# Patient Record
Sex: Female | Born: 1945 | Race: White | Hispanic: No | Marital: Married | Smoking: Former smoker
Health system: Southern US, Community
[De-identification: ages and names within clinical notes are randomized; demographics above are authoritative.]

## PROBLEM LIST (undated history)

## (undated) DIAGNOSIS — T7840XA Allergy, unspecified, initial encounter: Secondary | ICD-10-CM

## (undated) DIAGNOSIS — Z8489 Family history of other specified conditions: Secondary | ICD-10-CM

## (undated) HISTORY — PX: BREAST BIOPSY: SHX20

## (undated) HISTORY — PX: BILATERAL OOPHORECTOMY: SHX1221

## (undated) HISTORY — PX: ABDOMINAL HYSTERECTOMY: SHX81

## (undated) HISTORY — DX: Allergy, unspecified, initial encounter: T78.40XA

---

## 1992-12-20 HISTORY — PX: CHOLECYSTECTOMY: SHX55

## 1992-12-20 HISTORY — PX: ABDOMINAL HYSTERECTOMY: SHX81

## 1999-04-01 ENCOUNTER — Other Ambulatory Visit: Admission: RE | Admit: 1999-04-01 | Discharge: 1999-04-01 | Payer: Self-pay | Admitting: Obstetrics and Gynecology

## 1999-07-28 ENCOUNTER — Ambulatory Visit (HOSPITAL_COMMUNITY): Admission: RE | Admit: 1999-07-28 | Discharge: 1999-07-28 | Payer: Self-pay | Admitting: Gastroenterology

## 2000-09-09 ENCOUNTER — Encounter: Admission: RE | Admit: 2000-09-09 | Discharge: 2000-09-09 | Payer: Self-pay | Admitting: Obstetrics and Gynecology

## 2000-09-09 ENCOUNTER — Encounter: Payer: Self-pay | Admitting: Obstetrics and Gynecology

## 2000-10-06 ENCOUNTER — Other Ambulatory Visit: Admission: RE | Admit: 2000-10-06 | Discharge: 2000-10-06 | Payer: Self-pay | Admitting: Obstetrics and Gynecology

## 2001-11-01 ENCOUNTER — Encounter: Admission: RE | Admit: 2001-11-01 | Discharge: 2001-11-01 | Payer: Self-pay | Admitting: Obstetrics and Gynecology

## 2001-11-01 ENCOUNTER — Encounter: Payer: Self-pay | Admitting: Obstetrics and Gynecology

## 2001-12-26 ENCOUNTER — Other Ambulatory Visit: Admission: RE | Admit: 2001-12-26 | Discharge: 2001-12-26 | Payer: Self-pay | Admitting: Obstetrics and Gynecology

## 2003-12-06 ENCOUNTER — Encounter: Admission: RE | Admit: 2003-12-06 | Discharge: 2003-12-06 | Payer: Self-pay | Admitting: Obstetrics and Gynecology

## 2004-01-17 ENCOUNTER — Other Ambulatory Visit: Admission: RE | Admit: 2004-01-17 | Discharge: 2004-01-17 | Payer: Self-pay | Admitting: Obstetrics and Gynecology

## 2006-03-14 ENCOUNTER — Encounter: Admission: RE | Admit: 2006-03-14 | Discharge: 2006-03-14 | Payer: Self-pay | Admitting: Obstetrics and Gynecology

## 2006-05-09 ENCOUNTER — Emergency Department: Payer: Self-pay | Admitting: Emergency Medicine

## 2008-05-21 ENCOUNTER — Encounter: Admission: RE | Admit: 2008-05-21 | Discharge: 2008-05-21 | Payer: Self-pay | Admitting: Obstetrics and Gynecology

## 2012-03-22 LAB — HM MAMMOGRAPHY: HM Mammogram: NORMAL

## 2012-04-14 ENCOUNTER — Ambulatory Visit (INDEPENDENT_AMBULATORY_CARE_PROVIDER_SITE_OTHER): Payer: Medicare Other | Admitting: Internal Medicine

## 2012-04-14 ENCOUNTER — Encounter: Payer: Self-pay | Admitting: Internal Medicine

## 2012-04-14 VITALS — BP 128/70 | HR 85 | Temp 98.2°F | Resp 16 | Ht 63.5 in | Wt 231.8 lb

## 2012-04-14 DIAGNOSIS — Z1239 Encounter for other screening for malignant neoplasm of breast: Secondary | ICD-10-CM

## 2012-04-14 DIAGNOSIS — T7840XA Allergy, unspecified, initial encounter: Secondary | ICD-10-CM | POA: Insufficient documentation

## 2012-04-14 DIAGNOSIS — N390 Urinary tract infection, site not specified: Secondary | ICD-10-CM | POA: Insufficient documentation

## 2012-04-14 DIAGNOSIS — E538 Deficiency of other specified B group vitamins: Secondary | ICD-10-CM

## 2012-04-14 DIAGNOSIS — R5383 Other fatigue: Secondary | ICD-10-CM

## 2012-04-14 DIAGNOSIS — E785 Hyperlipidemia, unspecified: Secondary | ICD-10-CM

## 2012-04-14 DIAGNOSIS — M1990 Unspecified osteoarthritis, unspecified site: Secondary | ICD-10-CM

## 2012-04-14 DIAGNOSIS — E559 Vitamin D deficiency, unspecified: Secondary | ICD-10-CM

## 2012-04-14 DIAGNOSIS — E669 Obesity, unspecified: Secondary | ICD-10-CM

## 2012-04-14 DIAGNOSIS — R5381 Other malaise: Secondary | ICD-10-CM

## 2012-04-14 DIAGNOSIS — Z13820 Encounter for screening for osteoporosis: Secondary | ICD-10-CM

## 2012-04-14 DIAGNOSIS — M129 Arthropathy, unspecified: Secondary | ICD-10-CM

## 2012-04-14 LAB — POCT URINALYSIS DIPSTICK
Ketones, UA: NEGATIVE
Protein, UA: NEGATIVE
Spec Grav, UA: 1.02
pH, UA: 5

## 2012-04-14 MED ORDER — DICYCLOMINE HCL 20 MG PO TABS: 20.0000 mg | ORAL_TABLET | Freq: Four times a day (QID) | ORAL | Status: DC

## 2012-04-14 MED ORDER — SULFAMETHOXAZOLE-TMP DS 800-160 MG PO TABS: 1.0000 | ORAL_TABLET | Freq: Two times a day (BID) | ORAL | Status: AC

## 2012-04-14 MED ORDER — ESTRADIOL 0.1 MG/GM VA CREA: 2.0000 g | TOPICAL_CREAM | Freq: Every day | VAGINAL | Status: AC
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
## 2012-04-14 NOTE — Patient Instructions (Addendum)
Return for  Fasting labs prior to your physical.  If your urine is reflective of infection., we will call you in an antibiotic.    Estrace 2 grams nightly x 2 weeks,  Then reduce to 3 times weekly
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
## 2012-04-16 LAB — URINE CULTURE

## 2012-04-16 NOTE — Assessment & Plan Note (Signed)
I have addressed  BMI and recommended a low glycemic index diet utilizing smaller more frequent meals to increase metabolism.  I have also recommended that patient start exercising with a goal of 30 minutes of aerobic exercise a minimum of 5 days per week. Screening for lipid disorders, thyroid and diabetes to be done today.   

## 2012-04-16 NOTE — Assessment & Plan Note (Signed)
Last DEXA and mammogram were done at [facility] and she is due for both.

## 2012-04-18 ENCOUNTER — Telehealth: Payer: Self-pay | Admitting: Internal Medicine

## 2012-04-18 NOTE — Telephone Encounter (Signed)
Radiologist is requesting additional views of left breast,.  Has she been contacted?

## 2012-04-18 NOTE — Telephone Encounter (Signed)
Patient has an appt tomorrow for additional view

## 2012-04-26 ENCOUNTER — Encounter: Payer: Self-pay | Admitting: Internal Medicine

## 2012-05-02 ENCOUNTER — Encounter: Payer: Self-pay | Admitting: Internal Medicine

## 2012-05-08 ENCOUNTER — Other Ambulatory Visit (INDEPENDENT_AMBULATORY_CARE_PROVIDER_SITE_OTHER): Payer: Medicare Other | Admitting: *Deleted

## 2012-05-08 DIAGNOSIS — E538 Deficiency of other specified B group vitamins: Secondary | ICD-10-CM

## 2012-05-08 DIAGNOSIS — R5383 Other fatigue: Secondary | ICD-10-CM

## 2012-05-08 DIAGNOSIS — E559 Vitamin D deficiency, unspecified: Secondary | ICD-10-CM

## 2012-05-08 DIAGNOSIS — R5381 Other malaise: Secondary | ICD-10-CM

## 2012-05-08 DIAGNOSIS — E785 Hyperlipidemia, unspecified: Secondary | ICD-10-CM

## 2012-05-08 LAB — CBC WITH DIFFERENTIAL/PLATELET
Eosinophils Relative: 2.7 % (ref 0.0–5.0)
HCT: 37.3 % (ref 36.0–46.0)
Hemoglobin: 12.2 g/dL (ref 12.0–15.0)
Lymphs Abs: 1.6 10*3/uL (ref 0.7–4.0)
MCV: 84.7 fl (ref 78.0–100.0)
Monocytes Absolute: 0.4 10*3/uL (ref 0.1–1.0)
Neutro Abs: 3.1 10*3/uL (ref 1.4–7.7)
Platelets: 178 10*3/uL (ref 150.0–400.0)
WBC: 5.3 10*3/uL (ref 4.5–10.5)

## 2012-05-08 LAB — LIPID PANEL
LDL Cholesterol: 70 mg/dL (ref 0–99)
Total CHOL/HDL Ratio: 2

## 2012-05-09 LAB — COMPLETE METABOLIC PANEL WITH GFR
ALT: 15 U/L (ref 0–35)
AST: 22 U/L (ref 0–37)
Albumin: 3.9 g/dL (ref 3.5–5.2)
Alkaline Phosphatase: 52 U/L (ref 39–117)
Calcium: 8.9 mg/dL (ref 8.4–10.5)
Chloride: 107 mEq/L (ref 96–112)
Potassium: 4.4 mEq/L (ref 3.5–5.3)

## 2012-05-22 ENCOUNTER — Encounter: Payer: Self-pay | Admitting: Internal Medicine

## 2012-05-22 ENCOUNTER — Ambulatory Visit (INDEPENDENT_AMBULATORY_CARE_PROVIDER_SITE_OTHER): Payer: Medicare Other | Admitting: Internal Medicine

## 2012-05-22 VITALS — BP 120/68 | HR 69 | Temp 98.0°F | Resp 16 | Ht 64.0 in | Wt 228.0 lb

## 2012-05-22 DIAGNOSIS — Z0000 Encounter for general adult medical examination without abnormal findings: Secondary | ICD-10-CM

## 2012-05-22 DIAGNOSIS — R739 Hyperglycemia, unspecified: Secondary | ICD-10-CM

## 2012-05-22 DIAGNOSIS — R7309 Other abnormal glucose: Secondary | ICD-10-CM

## 2012-05-22 MED ORDER — ZOSTER VACCINE LIVE 19400 UNT/0.65ML ~~LOC~~ SOLR: 0.6500 mL | Freq: Once | SUBCUTANEOUS | Status: AC

## 2012-05-22 MED ORDER — PNEUMOCOCCAL VAC POLYVALENT 25 MCG/0.5ML IJ INJ: 0.5000 mL | INJECTION | Freq: Once | INTRAMUSCULAR | Status: AC

## 2012-05-22 NOTE — Assessment & Plan Note (Signed)
Discussed diet and exercise for goal BMI of < 30. Marland Kitchen  Dispelled any notion that low glycemic index diet increased risk for osteoporosis

## 2012-05-22 NOTE — Assessment & Plan Note (Signed)
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  She is s/p TAH/BSO.  Breast exam was also done and was normal.

## 2012-05-22 NOTE — Progress Notes (Signed)
Patient ID: Krista Gentry, female   DOB: 06-03-1946, 66 y.o.   MRN: [id number] The patient is here for annual Medicare wellness examination and management of other chronic and acute problems.   The risk factors are reflected in the social history.  The roster of all physicians providing medical care to patient - is listed in the Snapshot section of the chart.  Activities of daily living:  The patient is 100% independent in all ADLs: dressing, toileting, feeding as well as independent mobility  Home safety : The patient has smoke detectors in the home. They wear seatbelts.  There are no firearms at home. There is no violence in the home.   There is no risks for hepatitis, STDs or HIV. There is no   history of blood transfusion. They have no travel history to infectious disease endemic areas of the world.  The patient has seen their dentist in the last six month. They have seen their eye doctor in the last year. They admit to slight hearing difficulty with regard to whispered voices and some television programs.  They have deferred audiologic testing in the last year.  They do not  have excessive sun exposure. Discussed the need for sun protection: hats, long sleeves and use of sunscreen if there is significant sun exposure.   Diet: the importance of a healthy diet is discussed. They do have a healthy diet.  The benefits of regular aerobic exercise were discussed. She walks 4 times per week ,  20 minutes.   Depression screen: there are no signs or vegative symptoms of depression- irritability, change in appetite, anhedonia, sadness/tearfullness.  Cognitive assessment: the patient manages all their financial and personal affairs and is actively engaged. They could relate day,date,year and events; recalled 2/3 objects at 3 minutes; performed clock-face test normally.  The following portions of the patient's history were reviewed and updated as appropriate: allergies, current medications, past  family history, past medical history,  past surgical history, past social history  and problem list.  Visual acuity was not assessed per patient preference since she has regular follow up with her ophthalmologist. Hearing and body mass index were assessed and reviewed.   During the course of the visit the patient was educated and counseled about appropriate screening and preventive services including : fall prevention , diabetes screening, nutrition counseling, colorectal cancer screening, and recommended immunizations.

## 2012-05-22 NOTE — Patient Instructions (Addendum)
Consider resuming a  Low Glycemic Index Diet.  You can google "Low GI" to get lists of low GI fruits and vegetables  Also consider eating 6 smaller meals daily .  This boosts your metabolism and regulates your sugars.  Here is an example   7 AM Low carbohydrate Protein  Shakes (EAS Carb Control  Or Atkins ,  Available everywhere, In  cases at [employer] )  2.5 carbs  (Add or substitute a toasted sandwhich thin w/ peanut butter)  10 AM: Protein bar by Atkins (snack size,  Chocolate lover's variety at  [employer])    Lunch: sandwich on pita bread or flatbread (Joseph's makes a pita bread and a flat bread , available at [employer] and [employer]; Toufayah makes a low carb flatbread available at [employer] and [employer]) Mission makes a low carb whole wheat tortilla available at [employer] most grocery stores   3 PM:  Mid day :  Another protein bar,  Or a  cheese stick, 1/4 cup of almonds, walnuts, pistachios, pecans, peanuts,  Macadamia nuts  6 PM  Dinner:  "mean and green:"  Meat/chicken/fish, salad, and green veggie : use ranch, vinagrette,  Blue cheese, etc  9 PM snack : Breyer's low carb fudgsicle or  ice cream bar (Carb Smart), or  Weight Watcher's ice cream bar , or another protein shake  -------------------------------------------------------------------------------------------------------------------------------------------------------------  Also consider [place] yogurt as a mid day snack

## 2013-02-03 ENCOUNTER — Other Ambulatory Visit: Payer: Self-pay

## 2013-07-25 ENCOUNTER — Other Ambulatory Visit: Payer: Self-pay

## 2013-08-02 ENCOUNTER — Telehealth: Payer: Self-pay | Admitting: Internal Medicine

## 2013-08-02 ENCOUNTER — Emergency Department: Payer: Self-pay | Admitting: Emergency Medicine

## 2013-08-02 LAB — CBC
HCT: 38 % (ref 35.0–47.0)
HGB: 12.7 g/dL (ref 12.0–16.0)
MCH: 27.7 pg (ref 26.0–34.0)
MCHC: 33.5 g/dL (ref 32.0–36.0)
Platelet: 186 10*3/uL (ref 150–440)
RDW: 15.1 % — ABNORMAL HIGH (ref 11.5–14.5)

## 2013-08-02 LAB — COMPREHENSIVE METABOLIC PANEL
Albumin: 3.6 g/dL (ref 3.4–5.0)
Alkaline Phosphatase: 74 U/L (ref 50–136)
Anion Gap: 5 — ABNORMAL LOW (ref 7–16)
BUN: 17 mg/dL (ref 7–18)
Bilirubin,Total: 0.6 mg/dL (ref 0.2–1.0)
Calcium, Total: 9 mg/dL (ref 8.5–10.1)
Chloride: 106 mmol/L (ref 98–107)
Co2: 28 mmol/L (ref 21–32)
Creatinine: 0.87 mg/dL (ref 0.60–1.30)
EGFR (African American): >60
EGFR (Non-African Amer.): >60
Osmolality: 279 (ref 275–301)
SGOT(AST): 29 U/L (ref 15–37)
SGPT (ALT): 32 U/L (ref 12–78)
Sodium: 139 mmol/L (ref 136–145)
Total Protein: 7.2 g/dL (ref 6.4–8.2)

## 2013-08-02 LAB — BASIC METABOLIC PANEL
BUN: 17 mg/dL (ref 4–21)
Creatinine: 0.9 mg/dL (ref 0.5–1.1)
Glucose: 98 mg/dL
Potassium: 3.6 mmol/L (ref 3.4–5.3)

## 2013-08-02 LAB — CBC AND DIFFERENTIAL: WBC: 12.9 10^3/mL

## 2013-08-02 LAB — URINALYSIS, COMPLETE
Bacteria: NONE SEEN
Bilirubin,UR: NEGATIVE
Ph: 5 (ref 4.5–8.0)
Specific Gravity: 1.018 (ref 1.003–1.030)
Squamous Epithelial: 1

## 2013-08-02 LAB — TROPONIN I: Troponin-I: <0.02 ng/mL

## 2013-08-02 LAB — PRO B NATRIURETIC PEPTIDE: B-Type Natriuretic Peptide: 136 pg/mL — ABNORMAL HIGH (ref 0–125)

## 2013-08-02 NOTE — Telephone Encounter (Signed)
Patient Information:  Caller Name: Dolores  Phone: [phone]  Patient: Krista Gentry, Krista Gentry  Gender: Female  DOB: [birth year]  Age: 67 Years  PCP: Duncan Dull (Adults only)  Office Follow Up:  Does the office need to follow up with this patient?: No  Instructions For The Office: N/A  RN Note:  Had diarrhea yesterday related to IBS. Had only banana bread and water this morning at 0800. Last void at 1520. Can stand up but avoiding doing so due to ongoing weakness.   Symptoms  Reason For Call & Symptoms: Syncope twice 08/02/13 between 0800 and 1100.  Became dizzy, nauseated then passed out.  Woke upon floor with vomit around her.  Hit chin; minor abrasion to chest.  Fainted again after 2nd emesis approximately 1100 with abrasion to lip.  BP 138/70 P 70 approximately 0800.  Reviewed Health History In EMR: Yes  Reviewed Medications In EMR: Yes  Reviewed Allergies In EMR: Yes  Reviewed Surgeries / Procedures: Yes  Date of Onset of Symptoms: 08/02/2013  Guideline(s) Used:  Fainting  Disposition Per Guideline:   Go to ED Now  Reason For Disposition Reached:   Fainted > 15 minutes ago and still feels weak or dizzy  Advice Given:  N/A  Patient Will Follow Care Advice:  YES

## 2013-08-02 NOTE — Telephone Encounter (Signed)
FYI

## 2013-08-15 ENCOUNTER — Ambulatory Visit (INDEPENDENT_AMBULATORY_CARE_PROVIDER_SITE_OTHER): Payer: Self-pay | Admitting: Internal Medicine

## 2013-08-15 ENCOUNTER — Ambulatory Visit: Payer: Medicare Other | Admitting: Internal Medicine

## 2013-08-15 ENCOUNTER — Encounter: Payer: Self-pay | Admitting: Internal Medicine

## 2013-08-15 VITALS — BP 138/66 | HR 83 | Temp 98.8°F | Resp 14 | Wt 232.5 lb

## 2013-08-15 DIAGNOSIS — Z09 Encounter for follow-up examination after completed treatment for conditions other than malignant neoplasm: Secondary | ICD-10-CM

## 2013-08-15 DIAGNOSIS — R55 Syncope and collapse: Secondary | ICD-10-CM

## 2013-08-15 DIAGNOSIS — Z111 Encounter for screening for respiratory tuberculosis: Secondary | ICD-10-CM

## 2013-08-15 DIAGNOSIS — Z1239 Encounter for other screening for malignant neoplasm of breast: Secondary | ICD-10-CM

## 2013-08-15 DIAGNOSIS — R011 Cardiac murmur, unspecified: Secondary | ICD-10-CM

## 2013-08-15 DIAGNOSIS — E66813 Obesity, class 3: Secondary | ICD-10-CM

## 2013-08-15 MED ORDER — TETANUS-DIPHTH-ACELL PERTUSSIS 5-2.5-18.5 LF-MCG/0.5 IM SUSP: 0.5000 mL | Freq: Once | INTRAMUSCULAR | Status: DC

## 2013-08-15 MED ORDER — ZOSTER VACCINE LIVE 19400 UNT/0.65ML ~~LOC~~ SOLR: 0.6500 mL | Freq: Once | SUBCUTANEOUS | Status: DC

## 2013-08-15 MED ORDER — ONDANSETRON HCL 4 MG PO TABS: 4.0000 mg | ORAL_TABLET | Freq: Three times a day (TID) | ORAL | Status: DC | PRN

## 2013-08-15 NOTE — Progress Notes (Signed)
Patient ID: Krista Gentry, female   DOB: December 04, 1946, 67 y.o.   MRN: [id number]  Patient Active Problem List   Diagnosis Date Noted  . Syncope and collapse 08/17/2013  . Routine general medical examination at a health care facility 05/22/2012  . Obesity, Class III, BMI 40-49.9 (morbid obesity) 04/16/2012  . Arthritis 04/14/2012  . Screening for osteoporosis 04/14/2012  . Allergy     Subjective:  CC:   Chief Complaint  Patient presents with  . Follow-up    From ER blacked out X 2    HPI:   Krista Gentry is a 67 y.o. female who presents after being lost to follow up for over a year .  She had a recent ER visit for syncope preceded by dizziness and nausea/vomiting. Patient was seen in the ER on August 14 after developing non-vertiginous dizziness accompanied by nausea and vomiting x  2 .  Her syncopal episodes occurred after vomiting. She apparently struck the bridge of her nose on either the bathtub or the bathroom floor. She was taken to the emergency room at [facility] and given fluids. EKG head CT and screening labs were done. CT of the head showed only a cephalic hematoma and age related changes. EKG was normal,  troponin was normal , BMET normal.,  white count was slightly elevated 12.9. urinalysis was normal. She denies any persistent headache or double vision but notes that her shoulders and posterior neck are tender to palpation.  She has had prior syncopal events that have occurred intermittently since childhood and remembers 2 other distinct occasions during the last 10 years. . She has never had a cardiology evaluation. She did not play any contact sports in high school and college. There is no history of sudden death in the family.   Past Medical History  Diagnosis Date  . Allergy     Past Surgical History  Procedure Laterality Date  . Bilateral oophorectomy    . Cholecystectomy  1994  . Abdominal hysterectomy         The following portions of the patient's history  were reviewed and updated as appropriate: Allergies, current medications, and problem list.    Review of Systems:   12 Pt  review of systems was negative except those addressed in the HPI,     History   Social History  . Marital Status: Married    Spouse Name: N/A    Number of Children: N/A  . Years of Education: N/A   Occupational History  . Not on file.   Social History Main Topics  . Smoking status: Former Smoker -- 5 years    Types: Cigarettes    Quit date: 04/14/1970  . Smokeless tobacco: Never Used  . Alcohol Use: 3.6 oz/week    3 Shots of liquor, 1 Cans of beer, 2 Glasses of wine per week  . Drug Use: No  . Sexual Activity: Not on file   Other Topics Concern  . Not on file   Social History Narrative  . No narrative on file    Objective:  Filed Vitals:   08/15/13 1559  BP: 138/66  Pulse: 83  Temp: 98.8 F (37.1 C)  Resp: 14     General appearance: alert, cooperative and appears stated age Ears: normal TM's and external ear canals both ears Throat: lips, mucosa, and tongue normal; teeth and gums normal Neck: no adenopathy, no carotid bruit, supple, symmetrical, trachea midline and thyroid not enlarged, symmetric, no tenderness/mass/nodules Back: symmetric,  no curvature. ROM normal. No CVA tenderness. Lungs: clear to auscultation bilaterally Heart: regular rate and rhythm, S1, S2 normal, II/VI systolic murmur, non radiating., no click, rub or gallop Abdomen: soft, non-tender; bowel sounds normal; no masses,  no organomegaly Pulses: 2+ and symmetric Skin: Skin color, texture, turgor normal. No rashes or lesions Lymph nodes: Cervical, supraclavicular, and axillary nodes normal.  Assessment and Plan:  Obesity, Class III, BMI 40-49.9 (morbid obesity) I have addressed  BMI and recommended a low glycemic index diet utilizing smaller more frequent meals to increase metabolism.  I have also recommended that patient start exercising with a goal of 30  minutes of aerobic exercise a minimum of 5 days per week.  Syncope and collapse Recurrent, with no prior workup. Review of labs done at time of ER evaluation did not suggest dehydration. She has a slight systolic murmur on exam. Given her history of recurrent syncope throughout her entire life it would be appropriate to send her to cardiology for an evaluation with an echocardiogram and possible stress test.. referral to cardiology discussed and accepted.   A total of 40 minutes was spent with patient more than half of which was spent in counseling, reviewing records from other prviders and coordination of care.  Updated Medication List Outpatient Encounter Prescriptions as of 08/15/2013  Medication Sig Dispense Refill  . b complex vitamins tablet Take 1 tablet by mouth daily.      . Calcium Carbonate-Vitamin D (CALCIUM + D) 600-200 MG-UNIT TABS Take by mouth.      . cetirizine (ZYRTEC) 10 MG tablet Take 10 mg by mouth daily.      . Cranberry 1000 MG CAPS Take by mouth.      . folic acid (FOLVITE) 1 MG tablet Take 1 mg by mouth daily.      Chilton Si Tea, Camillia sinensis, (GREEN TEA PO) Take by mouth.      Marland Kitchen ibuprofen (ADVIL,MOTRIN) 200 MG tablet Take 200 mg by mouth every 6 (six) hours as needed.      . Multiple Vitamin (MULTIVITAMIN) tablet Take 1 tablet by mouth daily.      Marland Kitchen aspirin 81 MG tablet Take 81 mg by mouth daily.      . chlorpheniramine (ALLERGY) 4 MG tablet Take 4 mg by mouth daily.      Marland Kitchen dicyclomine (BENTYL) 20 MG tablet Take 1 tablet (20 mg total) by mouth every 6 (six) hours.  90 tablet  1  . meclizine (ANTIVERT) 25 MG tablet Take 1 tablet by mouth once.      . ondansetron (ZOFRAN) 4 MG tablet Take 1 tablet (4 mg total) by mouth every 8 (eight) hours as needed for nausea.  30 tablet  0  . TDaP (BOOSTRIX) 5-2.5-18.5 LF-MCG/0.5 injection Inject 0.5 mLs into the muscle once.  0.5 mL  0  . zoster vaccine live, PF, (ZOSTAVAX) 82956 UNT/0.65ML injection Inject 19,400 Units into the  skin once.  1 each  0   No facility-administered encounter medications on file as of 08/15/2013.     Orders Placed This Encounter  Procedures  . Ambulatory referral to Cardiology    No Follow-up on file.

## 2013-08-15 NOTE — Patient Instructions (Addendum)
You received the pneumonia vaccine today.  Repeat in 5 years.  Return on Friday to have your PPD read  Referral to [facility] cardiology to have your heart evaluated

## 2013-08-15 NOTE — Assessment & Plan Note (Signed)
I have addressed  BMI and recommended a low glycemic index diet utilizing smaller more frequent meals to increase metabolism.  I have also recommended that patient start exercising with a goal of 30 minutes of aerobic exercise a minimum of 5 days per week.  

## 2013-08-17 ENCOUNTER — Encounter: Payer: Self-pay | Admitting: Internal Medicine

## 2013-08-17 DIAGNOSIS — R55 Syncope and collapse: Secondary | ICD-10-CM | POA: Insufficient documentation

## 2013-08-17 NOTE — Assessment & Plan Note (Signed)
Recurrent, with no prior workup. Review of labs done at time of ER evaluation did not suggest dehydration. She has a slight systolic murmur on exam. Given her history of recurrent syncope throughout her entire life it would be appropriate to send her to cardiology for an evaluation with an echocardiogram and possible stress test.. referral to cardiology discussed and accepted.

## 2013-08-21 LAB — TB SKIN TEST: TB Skin Test: NEGATIVE

## 2013-08-21 NOTE — Addendum Note (Signed)
Addended by: Henrene Pastor R on: 08/21/2013 08:30 AM   Modules accepted: Orders

## 2013-09-03 ENCOUNTER — Encounter: Payer: Self-pay | Admitting: Cardiovascular Disease

## 2013-09-03 ENCOUNTER — Ambulatory Visit (INDEPENDENT_AMBULATORY_CARE_PROVIDER_SITE_OTHER): Payer: Medicare Other | Admitting: Cardiovascular Disease

## 2013-09-03 VITALS — BP 126/78 | HR 88 | Ht 65.0 in | Wt 229.5 lb

## 2013-09-03 DIAGNOSIS — R55 Syncope and collapse: Secondary | ICD-10-CM
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
## 2013-09-03 NOTE — Progress Notes (Signed)
HPI  This is a pleasant 67 year old female was referred by Dr. Darrick Huntsman for evaluation of syncope. She has no previous cardiac history. She has been healthy throughout her life with no chronic medical conditions. She had an emergency room visit on August 14 after a syncopal episode. She woke up that morning feeling nauseous. She tried to walk to the bathroom and started feeling dizzy and lightheaded. She had a syncopal episode of unknown duration. This was not witnessed. She stood up and tried to walk to the bathroom again and had another syncopal episode which was preceded by vomiting.  She apparently struck the bridge of her nose on either the bathtub or the bathroom floor. She was taken to the emergency room at [facility] and given fluids. EKG head CT and screening labs were done. CT of the head showed no evidence of intracranial bleed or CVA. There was cephalohematoma over the forehead without skull fracture.  EKG was normal, troponin was normal , BMET normal., white count was slightly elevated 12.9. urinalysis was normal.  She reports a total of 2 or 3 syncopal episodes in the last 10 years. All of these episodes were preceded by nausea or vomiting. She had a syncopal episode when she was a teenager in the setting of quick standing up with known history of orthostatic hypotension during that time. During all of these episodes, she does not have any palpitations, chest pain or shortness of breath. She feels back to her normal self.  No Known Allergies   Current Outpatient Prescriptions on File Prior to Visit  Medication Sig Dispense Refill  . aspirin 81 MG tablet Take 81 mg by mouth daily.      Marland Kitchen b complex vitamins tablet Take 1 tablet by mouth daily.      . Calcium Carbonate-Vitamin D (CALCIUM + D) 600-200 MG-UNIT TABS Take by mouth daily.       . cetirizine (ZYRTEC) 10 MG tablet Take 10 mg by mouth daily.      . chlorpheniramine (ALLERGY) 4 MG tablet Take 4 mg by mouth daily.      . Cranberry  1000 MG CAPS Take by mouth.      . folic acid (FOLVITE) 1 MG tablet Take 1 mg by mouth daily.      Chilton Si Tea, Camillia sinensis, (GREEN TEA PO) Take by mouth.      Marland Kitchen ibuprofen (ADVIL,MOTRIN) 200 MG tablet Take 200 mg by mouth every 6 (six) hours as needed.      . meclizine (ANTIVERT) 25 MG tablet Take 1 tablet by mouth once.      . Multiple Vitamin (MULTIVITAMIN) tablet Take 1 tablet by mouth daily.      . TDaP (BOOSTRIX) 5-2.5-18.5 LF-MCG/0.5 injection Inject 0.5 mLs into the muscle once.  0.5 mL  0  . zoster vaccine live, PF, (ZOSTAVAX) 16109 UNT/0.65ML injection Inject 19,400 Units into the skin once.  1 each  0   No current facility-administered medications on file prior to visit.     Past Medical History  Diagnosis Date  . Allergy      Past Surgical History  Procedure Laterality Date  . Bilateral oophorectomy    . Cholecystectomy  1994  . Abdominal hysterectomy       Family History  Problem Relation Age of Onset  . Kidney disease Mother     nephrolithiasis  . Dementia Mother   . Hypertension Mother   . Mental retardation Sister 8    possible  autism,  now with depression  . Cancer Maternal Aunt     breast cancer  . Dementia Maternal Aunt   . Mental retardation Maternal Aunt   . Dementia Maternal Uncle   . Cancer Maternal Grandmother     breast cancer  . Heart attack Paternal Grandmother      History   Social History  . Marital Status: Married    Spouse Name: N/A    Number of Children: N/A  . Years of Education: N/A   Occupational History  . Not on file.   Social History Main Topics  . Smoking status: Former Smoker -- 1.00 packs/day for 5 years    Types: Cigarettes    Quit date: 04/14/1970  . Smokeless tobacco: Never Used  . Alcohol Use: 3.6 oz/week    2 Glasses of wine, 1 Cans of beer, 3 Shots of liquor per week     Comment: occassion  . Drug Use: No  . Sexual Activity: Not on file   Other Topics Concern  . Not on file   Social History  Narrative  . No narrative on file     ROS A 10  point review of system was performed. It's negative other than what is mentioned in history of present illness.  PHYSICAL EXAM   BP 126/78  Pulse 88  Ht 5\' 5"  (1.651 m)  Wt 229 lb 8 oz (104.101 kg)  BMI 38.19 kg/m2 Constitutional: She is oriented to person, place, and time. She appears well-developed and well-nourished. No distress.  HENT: No nasal discharge.  Head: Normocephalic and atraumatic.  Eyes: Pupils are equal and round. Right eye exhibits no discharge. Left eye exhibits no discharge.  Neck: Normal range of motion. Neck supple. No JVD present. No thyromegaly present.  Cardiovascular: Normal rate, regular rhythm, normal heart sounds. Exam reveals no gallop and no friction rub. No murmur heard.  Pulmonary/Chest: Effort normal and breath sounds normal. No stridor. No respiratory distress. She has no wheezes. She has no rales. She exhibits no tenderness.  Abdominal: Soft. Bowel sounds are normal. She exhibits no distension. There is no tenderness. There is no rebound and no guarding.  Musculoskeletal: Normal range of motion. She exhibits no edema and no tenderness.  Neurological: She is alert and oriented to person, place, and time. Coordination normal.  Skin: Skin is warm and dry. No rash noted. She is not diaphoretic. No erythema. No pallor.  Psychiatric: She has a normal mood and affect. Her behavior is normal. Judgment and thought content normal.     EKG: Normal sinus with no significant ST or T wave changes. Normal PR and QT intervals   ASSESSMENAT AND PLAN

## 2013-09-03 NOTE — Patient Instructions (Addendum)
Your physician has requested that you have an echocardiogram. Echocardiography is a painless test that uses sound waves to create images of your heart. It provides your doctor with information about the size and shape of your heart and how well your heart's chambers and valves are working. This procedure takes approximately one hour. There are no restrictions for this procedure.  Your physician has requested that you have a carotid duplex. This test is an ultrasound of the carotid arteries in your neck. It looks at blood flow through these arteries that supply the brain with blood. Allow one hour for this exam. There are no restrictions or special instructions.  Follow up as needed.

## 2013-09-11 ENCOUNTER — Other Ambulatory Visit: Payer: Self-pay

## 2013-09-11 ENCOUNTER — Other Ambulatory Visit (INDEPENDENT_AMBULATORY_CARE_PROVIDER_SITE_OTHER): Payer: Medicare Other

## 2013-09-11 DIAGNOSIS — R55 Syncope and collapse: Secondary | ICD-10-CM

## 2013-09-11 DIAGNOSIS — R011 Cardiac murmur, unspecified: Secondary | ICD-10-CM

## 2013-09-12 ENCOUNTER — Telehealth: Payer: Self-pay

## 2013-09-12 NOTE — Telephone Encounter (Signed)
Message copied by Marilynne Halsted on Wed Sep 12, 2013  3:31 PM ------      Message from: [place] A      Created: Wed Sep 12, 2013  2:55 PM       Inform patient that echo was normal. ------

## 2013-09-12 NOTE — Telephone Encounter (Signed)
Pt is aware of results. 

## 2013-09-18 ENCOUNTER — Ambulatory Visit (INDEPENDENT_AMBULATORY_CARE_PROVIDER_SITE_OTHER): Payer: Medicare Other | Admitting: Internal Medicine

## 2013-09-18 ENCOUNTER — Encounter: Payer: Self-pay | Admitting: Internal Medicine

## 2013-09-18 VITALS — BP 128/74 | HR 77 | Temp 98.6°F | Resp 14 | Ht 63.75 in | Wt 226.5 lb

## 2013-09-18 DIAGNOSIS — R55 Syncope and collapse: Secondary | ICD-10-CM

## 2013-09-18 DIAGNOSIS — Z23 Encounter for immunization: Secondary | ICD-10-CM

## 2013-09-18 DIAGNOSIS — Z0000 Encounter for general adult medical examination without abnormal findings: Secondary | ICD-10-CM

## 2013-09-18 DIAGNOSIS — E669 Obesity, unspecified: Secondary | ICD-10-CM

## 2013-09-18 DIAGNOSIS — Z1211 Encounter for screening for malignant neoplasm of colon: Secondary | ICD-10-CM

## 2013-09-18 DIAGNOSIS — Z1239 Encounter for other screening for malignant neoplasm of breast: Secondary | ICD-10-CM

## 2013-09-18 DIAGNOSIS — M899 Disorder of bone, unspecified: Secondary | ICD-10-CM

## 2013-09-18 DIAGNOSIS — E559 Vitamin D deficiency, unspecified: Secondary | ICD-10-CM

## 2013-09-18 DIAGNOSIS — Z13820 Encounter for screening for osteoporosis: Secondary | ICD-10-CM

## 2013-09-18 DIAGNOSIS — M8580 Other specified disorders of bone density and structure, unspecified site: Secondary | ICD-10-CM | POA: Insufficient documentation

## 2013-09-18 DIAGNOSIS — E785 Hyperlipidemia, unspecified: Secondary | ICD-10-CM

## 2013-09-18 MED ORDER — ZOSTER VACCINE LIVE 19400 UNT/0.65ML ~~LOC~~ SOLR: 0.6500 mL | Freq: Once | SUBCUTANEOUS | Status: DC

## 2013-09-18 NOTE — Assessment & Plan Note (Signed)
Annual comprehensive exam was done including breast, and skin exam. All screenings have been addressed . She does not want to have a colonoscopy if it requires sedation , and is enquiring about the "capsule colonography."  We discussed annual screenings with IFOBT and if positive refer to GI

## 2013-09-18 NOTE — Assessment & Plan Note (Signed)
I have addressed  BMI and recommended a low glycemic index diet utilizing smaller more frequent meals to increase metabolism.  I have also recommended that patient start exercising with a goal of 30 minutes of aerobic exercise a minimum of 5 days per week. Screening for lipid disorders, thyroid and diabetes to be done   

## 2013-09-18 NOTE — Progress Notes (Signed)
Patient ID: Krista Gentry, female   DOB: 1946-05-25, 67 y.o.   MRN: [id number]   Subjective:     Krista Gentry is a 68 y.o. female and is here for a comprehensive physical exam. The patient reports no problems. No subsequent syncopal episodes.  ECHO was reportedly normal , done by Dr. Kirke Corin.  Scheduled for carotid ultrasound next week  .Does not want to have a colonoscopy  .  Did have one at age 36 and it was normal.   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  Dr. Sherryle Lis in [place] follow up.      History   Social History  . Marital Status: Married    Spouse Name: N/A    Number of Children: N/A  . Years of Education: N/A   Occupational History  . Not on file.   Social History Main Topics  . Smoking status: Former Smoker -- 1.00 packs/day for 5 years    Types: Cigarettes    Quit date: 04/14/1970  . Smokeless tobacco: Never Used  . Alcohol Use: 3.6 oz/week    2 Glasses of wine, 1 Cans of beer, 3 Shots of liquor per week     Comment: occassion  . Drug Use: No  . Sexual Activity: Not on file   Other Topics Concern  . Not on file   Social History Narrative  . No narrative on file   Health Maintenance  Topic Date Due  . Tetanus/tdap  11/11/1965  . Colonoscopy  11/11/1996  . Zostavax  11/11/2006  . Pneumococcal Polysaccharide Vaccine Age 58 And Over  11/12/2011  . Influenza Vaccine  07/20/2013  . Mammogram  04/19/2014    The following portions of the patient's history were reviewed and updated as appropriate: allergies, current medications, past family history, past medical history, past social history, past surgical history and problem list.  Review of Systems A comprehensive review of systems was negative.   Objective:   BP 128/74  Pulse 77  Temp(Src) 98.6 F (37 C) (Oral)  Resp 14  Ht 5' 3.75" (1.619 m)  Wt 226 lb 8 oz (102.74 kg)  BMI 39.2 kg/m2  SpO2 97%  General Appearance:    Alert, cooperative, no distress, appears stated age  Head:    Normocephalic,  without obvious abnormality, atraumatic  Eyes:    PERRL, conjunctiva/corneas clear, EOM's intact, fundi    benign, both eyes  Ears:    Normal TM's and external ear canals, both ears  Nose:   Nares normal, septum midline, mucosa normal, no drainage    or sinus tenderness  Throat:   Lips, mucosa, and tongue normal; teeth and gums normal  Neck:   Supple, symmetrical, trachea midline, no adenopathy;    thyroid:  no enlargement/tenderness/nodules; no carotid   bruit or JVD  Back:     Symmetric, no curvature, ROM normal, no CVA tenderness  Lungs:     Clear to auscultation bilaterally, respirations unlabored  Chest Wall:    No tenderness or deformity   Heart:    Regular rate and rhythm, S1 and S2 normal, no murmur, rub   or gallop  Breast Exam:    No tenderness, masses, or nipple abnormality  Abdomen:     Soft, non-tender, bowel sounds active all four quadrants,    no masses, no organomegaly     Extremities:   Extremities normal, atraumatic, no cyanosis or edema  Pulses:   2+ and symmetric all extremities  Skin:   Skin  color, texture, turgor normal, no rashes or lesions  Lymph nodes:   Cervical, supraclavicular, and axillary nodes normal  Neurologic:   CNII-XII intact, normal strength, sensation and reflexes    throughout     Assessment:   Routine general medical examination at a health care facility Annual comprehensive exam was done including breast, and skin exam. All screenings have been addressed . She does not want to have a colonoscopy if it requires sedation , and is enquiring about the "capsule colonography."  We discussed annual screenings with IFOBT and if positive refer to GI  Screening for osteoporosis No record of prior DEXAS which were done at [facility] .  Records requested  Vasovagal syncope One episdoe in late August.  none since.  Discussed ways to avoid recurrence. Carotid dopplers ordered and pending   Obesity, Class III, BMI 40-49.9 (morbid obesity) I have addressed   BMI and recommended a low glycemic index diet utilizing smaller more frequent meals to increase metabolism.  I have also recommended that patient start exercising with a goal of 30 minutes of aerobic exercise a minimum of 5 days per week. Screening for lipid disorders, thyroid and diabetes to be done     Updated Medication List Outpatient Encounter Prescriptions as of 09/18/2013  Medication Sig Dispense Refill  . aspirin 81 MG tablet Take 81 mg by mouth daily.      Marland Kitchen b complex vitamins tablet Take 1 tablet by mouth daily.      . Calcium Carbonate-Vitamin D (CALCIUM + D) 600-200 MG-UNIT TABS Take by mouth daily.       . cetirizine (ZYRTEC) 10 MG tablet Take 10 mg by mouth daily.      . chlorpheniramine (ALLERGY) 4 MG tablet Take 4 mg by mouth daily.      . Cranberry 1000 MG CAPS Take by mouth.      . folic acid (FOLVITE) 1 MG tablet Take 1 mg by mouth daily.      Chilton Si Tea, Camillia sinensis, (GREEN TEA PO) Take by mouth.      Marland Kitchen ibuprofen (ADVIL,MOTRIN) 200 MG tablet Take 200 mg by mouth every 6 (six) hours as needed.      . meclizine (ANTIVERT) 25 MG tablet Take 1 tablet by mouth once.      . Multiple Vitamin (MULTIVITAMIN) tablet Take 1 tablet by mouth daily.      . TDaP (BOOSTRIX) 5-2.5-18.5 LF-MCG/0.5 injection Inject 0.5 mLs into the muscle once.  0.5 mL  0  . zoster vaccine live, PF, (ZOSTAVAX) 04540 UNT/0.65ML injection Inject 19,400 Units into the skin once.  1 each  0  . zoster vaccine live, PF, (ZOSTAVAX) 98119 UNT/0.65ML injection Inject 19,400 Units into the skin once.  1 each  0   No facility-administered encounter medications on file as of 09/18/2013.

## 2013-09-18 NOTE — Patient Instructions (Addendum)
You had your annual Medicare wellness exam today  We will schedule your 3 D mammogram at [place] soon.  Please use the stool kit to send [place] back a sample to test for blood.  This is your colon CA screening test.   Please make a lab appt soon for fasting labs   We will contact you with the bloodwork results

## 2013-09-18 NOTE — Assessment & Plan Note (Signed)
One episdoe in late August.  none since.  Discussed ways to avoid recurrence. Carotid dopplers ordered and pending

## 2013-09-18 NOTE — Assessment & Plan Note (Signed)
No record of prior DEXAS which were done at [facility] .  Records requested

## 2013-09-20 ENCOUNTER — Other Ambulatory Visit (INDEPENDENT_AMBULATORY_CARE_PROVIDER_SITE_OTHER): Payer: Medicare Other

## 2013-09-20 DIAGNOSIS — Z1211 Encounter for screening for malignant neoplasm of colon: Secondary | ICD-10-CM

## 2013-09-20 LAB — FECAL OCCULT BLOOD, IMMUNOCHEMICAL: Fecal Occult Bld: NEGATIVE

## 2013-09-21 ENCOUNTER — Encounter: Payer: Self-pay | Admitting: Internal Medicine

## 2013-09-24 ENCOUNTER — Other Ambulatory Visit (INDEPENDENT_AMBULATORY_CARE_PROVIDER_SITE_OTHER): Payer: Medicare Other

## 2013-09-24 DIAGNOSIS — E559 Vitamin D deficiency, unspecified: Secondary | ICD-10-CM

## 2013-09-24 DIAGNOSIS — Z13820 Encounter for screening for osteoporosis: Secondary | ICD-10-CM

## 2013-09-24 DIAGNOSIS — E669 Obesity, unspecified: Secondary | ICD-10-CM

## 2013-09-24 DIAGNOSIS — M899 Disorder of bone, unspecified: Secondary | ICD-10-CM

## 2013-09-24 DIAGNOSIS — M8580 Other specified disorders of bone density and structure, unspecified site: Secondary | ICD-10-CM

## 2013-09-24 DIAGNOSIS — E785 Hyperlipidemia, unspecified: Secondary | ICD-10-CM

## 2013-09-24 LAB — TSH: TSH: 4.78 u[IU]/mL (ref 0.35–5.50)

## 2013-09-24 LAB — COMPREHENSIVE METABOLIC PANEL
ALT: 14 U/L (ref 0–35)
Alkaline Phosphatase: 48 U/L (ref 39–117)
BUN: 18 mg/dL (ref 6–23)
Creatinine, Ser: 0.9 mg/dL (ref 0.4–1.2)
Glucose, Bld: 113 mg/dL — ABNORMAL HIGH (ref 70–99)
Potassium: 4.3 mEq/L (ref 3.5–5.1)
Total Bilirubin: 0.5 mg/dL (ref 0.3–1.2)

## 2013-09-24 LAB — LIPID PANEL
Cholesterol: 183 mg/dL (ref 0–200)
HDL: 70.5 mg/dL (ref >39.00)
LDL Cholesterol: 80 mg/dL (ref 0–99)
VLDL: 33 mg/dL (ref 0.0–40.0)

## 2013-09-25 ENCOUNTER — Encounter: Payer: Self-pay | Admitting: Internal Medicine

## 2013-09-26 LAB — VITAMIN D 25 HYDROXY (VIT D DEFICIENCY, FRACTURES): Vit D, 25-Hydroxy: 37 ng/mL (ref 30–89)

## 2013-10-03 ENCOUNTER — Encounter (INDEPENDENT_AMBULATORY_CARE_PROVIDER_SITE_OTHER): Payer: Medicare Other

## 2013-10-03 DIAGNOSIS — I6529 Occlusion and stenosis of unspecified carotid artery: Secondary | ICD-10-CM

## 2013-10-03 DIAGNOSIS — R55 Syncope and collapse: Secondary | ICD-10-CM

## 2013-10-04 ENCOUNTER — Encounter: Payer: Self-pay | Admitting: Emergency Medicine

## 2013-10-08 ENCOUNTER — Telehealth: Payer: Self-pay

## 2013-10-08 NOTE — Telephone Encounter (Signed)
Message copied by Marilynne Halsted on Mon Oct 08, 2013 10:23 AM ------      Message from: [place], [place] A      Created: Fri Oct 05, 2013  5:22 PM       Moderate carotid stenosis on left side. No critical but needs a repeat carotid doppler in 1 year. ------

## 2013-10-08 NOTE — Telephone Encounter (Signed)
Spoke w/ pt.  She is aware of results.  

## 2014-10-09 ENCOUNTER — Telehealth: Payer: Self-pay | Admitting: Internal Medicine

## 2014-10-09 DIAGNOSIS — R928 Other abnormal and inconclusive findings on diagnostic imaging of breast: Secondary | ICD-10-CM | POA: Insufficient documentation

## 2014-10-09 NOTE — Telephone Encounter (Signed)
[facility] has reported that her mammogram was abnormal on the right and on the left .  They saw microcalcifications on the right and asymmetry on the left  and want additional images.  If they have not contacted her by Friday, let me know

## 2014-10-09 NOTE — Assessment & Plan Note (Signed)
[facility] has reported that her mammogram was abnormal on the right and on the left .  They saw microcalcifications on the right and asymmetry on the left  and want additional images.  If they have not contacted her by Friday, let me know

## 2014-10-10 NOTE — Telephone Encounter (Signed)
Left message for patient to return call to office. 

## 2014-10-10 NOTE — Telephone Encounter (Signed)
The patient called back and is hoping you can return her call as soon as your available.

## 2014-10-10 NOTE — Telephone Encounter (Signed)
Pt notified & she has heard back from [facility]. She is scheduled to go back tomorrow @ 3:30.

## 2014-10-14 ENCOUNTER — Telehealth: Payer: Self-pay | Admitting: Internal Medicine

## 2014-10-14 DIAGNOSIS — R928 Other abnormal and inconclusive findings on diagnostic imaging of breast: Secondary | ICD-10-CM

## 2014-10-14 NOTE — Telephone Encounter (Signed)
Patient notified and will proceed with recommendation.

## 2014-10-14 NOTE — Telephone Encounter (Signed)
Patient stated that [facility] is recommending a core Biopsy, but patient is wanting a second opinion and if core biopsy is recommended again she would like you to refer. Patient ask if she could get a 3D mammogram to confer or rule out need. Please advise.

## 2014-10-14 NOTE — Telephone Encounter (Signed)
I am referring her to Dr Bary Castilla for an opinion on whether she needs a biopsy .  I trust him with all of my patients who have had abnormal mammograms.  He will need the images from [place].   Referral is in process,.  Bring images to appt

## 2014-10-17 ENCOUNTER — Telehealth: Payer: Self-pay | Admitting: Internal Medicine

## 2014-10-17 ENCOUNTER — Ambulatory Visit: Payer: Self-pay | Admitting: General Surgery

## 2014-10-17 NOTE — Telephone Encounter (Signed)
Krista Gentry,  This patient was referred to Benay Pillow but was given an appt with his partner  Dr Jamal Collin,  So she cancelled it.  Just tryiing to figure out if you didn't see the preference , or whether their office is making switches on [place].Marland KitchenMarland Kitchen

## 2014-10-17 NOTE — Telephone Encounter (Signed)
Ms. Krista Gentry called saying she was referred to Dr. Jamal Collin and she told her PCP she did not want to go to him. Today she cancelled her appt with the doctor. Upon speaking to a "physician friend of hers" she's looking to make an appt with someone else. She wanted Dr. Derrel Nip to be aware of this. Thank you.

## 2014-10-21 NOTE — Telephone Encounter (Addendum)
I do see the preference , have help from outside ,     I called pt she has schedule an appointment with Dr Ronie Spies  @ [facility] ,

## 2014-11-01 ENCOUNTER — Encounter: Payer: Self-pay | Admitting: Internal Medicine

## 2014-11-04 ENCOUNTER — Ambulatory Visit: Payer: Self-pay | Admitting: General Surgery

## 2014-11-26 ENCOUNTER — Encounter: Payer: Self-pay | Admitting: Internal Medicine

## 2015-06-16 ENCOUNTER — Other Ambulatory Visit: Payer: Self-pay

## 2016-02-09 DIAGNOSIS — Z1231 Encounter for screening mammogram for malignant neoplasm of breast: Secondary | ICD-10-CM | POA: Diagnosis not present

## 2016-02-10 LAB — HM MAMMOGRAPHY

## 2016-02-24 ENCOUNTER — Encounter: Payer: Self-pay | Admitting: Internal Medicine

## 2016-10-14 DIAGNOSIS — H524 Presbyopia: Secondary | ICD-10-CM | POA: Diagnosis not present

## 2016-12-15 ENCOUNTER — Encounter: Payer: Self-pay | Admitting: Family

## 2016-12-15 ENCOUNTER — Ambulatory Visit (INDEPENDENT_AMBULATORY_CARE_PROVIDER_SITE_OTHER): Payer: Medicare Other | Admitting: Family

## 2016-12-15 VITALS — BP 136/68 | HR 85 | Temp 98.7°F | Ht 63.75 in | Wt 236.0 lb

## 2016-12-15 DIAGNOSIS — J209 Acute bronchitis, unspecified: Secondary | ICD-10-CM | POA: Diagnosis not present

## 2016-12-15 MED ORDER — AZITHROMYCIN 250 MG PO TABS: ORAL_TABLET | ORAL | 0 refills | Status: DC

## 2016-12-15 MED ORDER — BENZONATATE 100 MG PO CAPS: 100.0000 mg | ORAL_CAPSULE | Freq: Two times a day (BID) | ORAL | 0 refills | Status: DC | PRN

## 2016-12-15 MED ORDER — ALBUTEROL SULFATE HFA 108 (90 BASE) MCG/ACT IN AERS: 2.0000 | INHALATION_SPRAY | Freq: Four times a day (QID) | RESPIRATORY_TRACT | 1 refills | Status: DC | PRN

## 2016-12-15 NOTE — Patient Instructions (Signed)
I suspect that your infection is viral in nature.  As discussed, I advise that you wait to fill the antibiotic after 1-2 days of symptom management to see if your symptoms improve. If you do not show improvement, you may take the antibiotic as prescribed.  Increase intake of clear fluids. Congestion is best treated by hydration, when mucus is wetter, it is thinner, less sticky, and easier to expel from the body, either through coughing up drainage, or by blowing your nose.   Get plenty of rest.   Use saline nasal drops and blow your nose frequently. Run a humidifier at night and elevate the head of the bed. Vicks Vapor rub will help with congestion and cough. Steam showers and sinus massage for congestion.   Use Acetaminophen or Ibuprofen as needed for fever or pain. Avoid second hand smoke. Even the smallest exposure will worsen symptoms.   Over the counter medications you can try include Delsym for cough, a decongestant for congestion, and Mucinex or Robitussin as an expectorant. Be sure to just get the plain Mucinex or Robitussin that just has one medication (Guaifenesen). We don't recommend the combination products. Note, be sure to drink two glasses of water with each dose of Mucinex as the medication will not work well without adequate hydration.   You can also try a teaspoon of honey to see if this will help reduce cough. Throat lozenges can sometimes be beneficial as well.    This illness will typically last 7 - 10 days.   Please follow up with our clinic if you develop a fever greater than 101 F, symptoms worsen, or do not resolve in the next week.     

## 2016-12-15 NOTE — Progress Notes (Signed)
Subjective:    Patient ID: Krista Gentry, female    DOB: Apr 25, 1946, 70 y.o.   MRN: [id number]  CC: Krista Gentry is a 70 y.o. female who presents today for an acute visit.    HPI: CC: productive cough, congestion for one week, worsening. Cough worse at night. Started out with sore throat, now has nasal congestion.Occasional wheezing, mild HA. No ear pain, fever.  Tried OTC sudafed, zyrtec without relief.  Former smoker                                   HISTORY:  Past Medical History:  Diagnosis Date  . Allergy    Past Surgical History:  Procedure Laterality Date  . ABDOMINAL HYSTERECTOMY    . BILATERAL OOPHORECTOMY    . CHOLECYSTECTOMY  1994   Family History  Problem Relation Age of Onset  . Kidney disease Mother     nephrolithiasis  . Dementia Mother   . Hypertension Mother   . Mental retardation Sister 8    possible autism,  now with depression  . Cancer Maternal Aunt     breast cancer  . Dementia Maternal Aunt   . Mental retardation Maternal Aunt   . Dementia Maternal Uncle   . Cancer Maternal Grandmother     breast cancer  . Heart attack Paternal Grandmother     Allergies: Patient has no known allergies. Current Outpatient Prescriptions on File Prior to Visit  Medication Sig Dispense Refill  . aspirin 81 MG tablet Take 81 mg by mouth daily.    Marland Kitchen b complex vitamins tablet Take 1 tablet by mouth daily.    . Calcium Carbonate-Vitamin D (CALCIUM + D) 600-200 MG-UNIT TABS Take by mouth daily.     . cetirizine (ZYRTEC) 10 MG tablet Take 10 mg by mouth daily.    . chlorpheniramine (ALLERGY) 4 MG tablet Take 4 mg by mouth daily.    . Cranberry 1000 MG CAPS Take by mouth.    . folic acid (FOLVITE) 1 MG tablet Take 1 mg by mouth daily.    Nyoka Cowden Tea, Camillia sinensis, (GREEN TEA PO) Take by mouth.    Marland Kitchen ibuprofen (ADVIL,MOTRIN) 200 MG tablet Take 200 mg by mouth every 6 (six) hours as needed.    . meclizine (ANTIVERT) 25 MG tablet Take 1 tablet by mouth once.     . Multiple Vitamin (MULTIVITAMIN) tablet Take 1 tablet by mouth daily.    . TDaP (BOOSTRIX) 5-2.5-18.5 LF-MCG/0.5 injection Inject 0.5 mLs into the muscle once. 0.5 mL 0  . zoster vaccine live, PF, (ZOSTAVAX) 16109 UNT/0.65ML injection Inject 19,400 Units into the skin once. 1 each 0  . zoster vaccine live, PF, (ZOSTAVAX) 60454 UNT/0.65ML injection Inject 19,400 Units into the skin once. 1 each 0   No current facility-administered medications on file prior to visit.     Social History  Substance Use Topics  . Smoking status: Former Smoker    Packs/day: 1.00    Years: 5.00    Types: Cigarettes    Quit date: 04/14/1970  . Smokeless tobacco: Never Used  . Alcohol use 3.6 oz/week    2 Glasses of wine, 1 Cans of beer, 3 Shots of liquor per week     Comment: occassion    Review of Systems  Constitutional: Negative for chills and fever.  HENT: Positive for congestion, rhinorrhea and sore throat. Negative for ear  pain and sinus pressure.   Respiratory: Positive for cough and wheezing. Negative for shortness of breath.   Cardiovascular: Negative for chest pain and palpitations.  Gastrointestinal: Negative for nausea and vomiting.      Objective:    BP 136/68   Pulse 85   Temp 98.7 F (37.1 C) (Oral)   Ht 5' 3.75" (1.619 m)   Wt 236 lb (107 kg)   SpO2 94%   BMI 40.83 kg/m    Physical Exam  Constitutional: She appears well-developed and well-nourished.  HENT:  Head: Normocephalic and atraumatic.  Right Ear: Hearing, tympanic membrane, external ear and ear canal normal. No drainage, swelling or tenderness. No foreign bodies. Tympanic membrane is not erythematous and not bulging. No middle ear effusion. No decreased hearing is noted.  Left Ear: Hearing, tympanic membrane, external ear and ear canal normal. No drainage, swelling or tenderness. No foreign bodies. Tympanic membrane is not erythematous and not bulging.  No middle ear effusion. No decreased hearing is noted.  Nose:  Nose normal. No rhinorrhea. Right sinus exhibits no maxillary sinus tenderness and no frontal sinus tenderness. Left sinus exhibits no maxillary sinus tenderness and no frontal sinus tenderness.  Mouth/Throat: Uvula is midline, oropharynx is clear and moist and mucous membranes are normal. No oropharyngeal exudate, posterior oropharyngeal edema, posterior oropharyngeal erythema or tonsillar abscesses.  Eyes: Conjunctivae are normal.  Cardiovascular: Regular rhythm, normal heart sounds and normal pulses.   Pulmonary/Chest: Effort normal. She has wheezes in the left middle field. She has no rhonchi. She has no rales.  Scant expiratory wheeze   Lymphadenopathy:       Head (right side): No submental, no submandibular, no tonsillar, no preauricular, no posterior auricular and no occipital adenopathy present.       Head (left side): No submental, no submandibular, no tonsillar, no preauricular, no posterior auricular and no occipital adenopathy present.    She has no cervical adenopathy.  Neurological: She is alert.  Skin: Skin is warm and dry.  Psychiatric: She has a normal mood and affect. Her speech is normal and behavior is normal. Thought content normal.  Vitals reviewed.  Patient Gentry significantly better after albuterol treatment. Lung sounds clear and increased     Assessment & Plan:  1. Acute bronchitis, unspecified organism Afebrile. SaO2 94. Patient and I jointly decided to treat symptoms 1-2 more days and patient will try Mucinex. If no improvement, patient understands to filling antibiotic. Return precautions given.  - albuterol (PROVENTIL HFA) 108 (90 Base) MCG/ACT inhaler; Inhale 2 puffs into the lungs every 6 (six) hours as needed for wheezing or shortness of breath.  Dispense: 1 Inhaler; Refill: 1 - azithromycin (ZITHROMAX) 250 MG tablet; Tale 500 mg PO on day 1, then 250 mg PO q24h x 4 days.  Dispense: 6 tablet; Refill: 0 - benzonatate (TESSALON) 100 MG capsule; Take 1 capsule  (100 mg total) by mouth 2 (two) times daily as needed for cough.  Dispense: 20 capsule; Refill: 0    I am having Ms. Kaney maintain her aspirin, Calcium Carbonate-Vitamin D, chlorpheniramine, multivitamin, b complex vitamins, Cranberry, ibuprofen, folic acid, (Green Tea, Camillia sinensis, (GREEN TEA PO)), cetirizine, meclizine, Tdap, zoster vaccine live (PF), and zoster vaccine live (PF).   No orders of the defined types were placed in this encounter.   Return precautions given.   Risks, benefits, and alternatives of the medications and treatment plan prescribed today were discussed, and patient expressed understanding.   Education regarding symptom management and  diagnosis given to patient on AVS.  Continue to follow with Crecencio Mc, MD for routine health maintenance.   Johney Frame and I agreed with plan.   Mable Paris, FNP

## 2016-12-15 NOTE — Progress Notes (Signed)
Pre visit review using our clinic review tool, if applicable. No additional management support is needed unless otherwise documented below in the visit note. 

## 2017-06-29 ENCOUNTER — Telehealth: Payer: Self-pay | Admitting: Internal Medicine

## 2017-06-29 ENCOUNTER — Telehealth: Payer: Self-pay | Admitting: *Deleted

## 2017-06-29 NOTE — Telephone Encounter (Signed)
Patient fell and hit her head from her chair. Patient currently has a headache. No dizziness or other symptoms reported  *pt transferred to Nurse Line

## 2017-06-29 NOTE — Telephone Encounter (Signed)
Are you still willing to see this pt since it has been almost 4 years since she was seen last.

## 2017-06-29 NOTE — Telephone Encounter (Signed)
Agree with need for evaluation.  If she is refusing to be seen now, then will need to monitor and have someone monitor her symptoms.  Any change - needs to be seen.

## 2017-06-29 NOTE — Telephone Encounter (Signed)
Pt called looking to make an appt for a physical with Dr. Derrel Nip. Pt's last visit with Dr. Derrel Nip was 09/18/13. Please advise, thank you!

## 2017-06-29 NOTE — Telephone Encounter (Signed)
fyi

## 2017-06-29 NOTE — Telephone Encounter (Signed)
Patient Name: RANETTE LUCKADOO DOB: 1946-12-06 Initial Comment Caller states she fell from a chair this morning, hit her head and neck. Has a headache Nurse Assessment Nurse: Ronnald Ramp, RN, Miranda Date/Time (Eastern Time): 06/29/2017 11:26:33 AM Confirm and document reason for call. If symptomatic, describe symptoms. ---Caller states she was sitting in a rolling chair. She bent over and the chair rolled out from under her. She fell backward and hit her neck/bases of her head on another piece of furniture. She immediately noticed a headache on her forehead. Does the patient have any new or worsening symptoms? ---Yes Will a triage be completed? ---Yes Related visit to physician within the last 2 weeks? ---No Does the PT have any chronic conditions? (i.e. diabetes, asthma, etc.) ---No Is this a behavioral health or substance abuse call? ---No Guidelines Guideline Title Affirmed Question Affirmed Notes Head Injury Scalp swelling, bruise or pain Final Disposition User Green, RN, Miranda Disagree/Comply: Leta Baptist

## 2017-06-29 NOTE — Telephone Encounter (Signed)
Hit head at between base of head and at neck, patient denies nausea , dizziness, but has slight headache at forehead. Patient is followng care advice given to use Ice if swelling develops at neck , to call or go to ER if has nausea, persistent headache, dizziness, and to have someone monitor her if she takes a nap to wake her after one hour, if any symptoms develop to call office. Per [facility].  Patient refuses evaluation for now. FYI

## 2017-06-29 NOTE — Telephone Encounter (Signed)
Patient aware.

## 2017-06-30 NOTE — Telephone Encounter (Signed)
No, sorry. Closed to any more patients

## 2017-07-26 ENCOUNTER — Ambulatory Visit (INDEPENDENT_AMBULATORY_CARE_PROVIDER_SITE_OTHER): Payer: Medicare Other | Admitting: Family

## 2017-07-26 ENCOUNTER — Encounter: Payer: Self-pay | Admitting: Family

## 2017-07-26 VITALS — BP 146/82 | HR 72 | Temp 98.2°F | Ht 63.75 in | Wt 222.3 lb

## 2017-07-26 DIAGNOSIS — R55 Syncope and collapse: Secondary | ICD-10-CM

## 2017-07-26 DIAGNOSIS — D171 Benign lipomatous neoplasm of skin and subcutaneous tissue of trunk: Secondary | ICD-10-CM | POA: Diagnosis not present

## 2017-07-26 DIAGNOSIS — R197 Diarrhea, unspecified: Secondary | ICD-10-CM

## 2017-07-26 NOTE — Assessment & Plan Note (Signed)
Suspect lipoma. Pending [place].

## 2017-07-26 NOTE — Patient Instructions (Addendum)
US carotid and abdomen   Stool culture  May try imodium as needed for diarrhea   Follow up for physical and fasting labs

## 2017-07-26 NOTE — Progress Notes (Signed)
Pre visit review using our clinic review tool, if applicable. No additional management support is needed unless otherwise documented below in the visit note. 

## 2017-07-26 NOTE — Assessment & Plan Note (Signed)
Patient declines colonoscopy today as concerned with anesthesia. Discussed with her that IBS is a diagnosis of exclusion and would feel more comfortable if she had more formal evaluation since diarrhea persistent. At this time she feels satisfied with workup that she's had the past suggestive of irritable bowel syndrome. Symptoms are not worsening or progressing per patient, she agreed to try when necessary Imodium and pending stool culture to ensure no underlying bacterial etiology. Advised patient keep me abreast of any new, worsening symptoms.

## 2017-07-26 NOTE — Assessment & Plan Note (Signed)
Last episode 2 years ago. Told vasovagal at that time.In patient's chart, did notice that her carotid ultrasound was not repeated. We will jointly agreed to repeat this today although patient is asymptomatic. Advised if any recurrence of symptom, to let me know asap. Patient verbalized understanding.

## 2017-07-26 NOTE — Progress Notes (Signed)
Subjective:    Patient ID: Krista Gentry, female    DOB: 1946/09/06, 71 y.o.   MRN: [id number]  CC: Krista Gentry is a 71 y.o. female who presents today for follow up and [place] care.   HPI: IBS- diagnosed per patient after colonoscopy, onset ever since gallbladder removed 1994, presents with diarrhea which seems to be after first meal of the day. Fiber does help. No abdominal pain, and diarrhea is not worsening. Brings it up today as would like medication to take prn for diarrhea when traveling or when going out Worsened when nervous.  No blood in stool. Regular bowel movements, hasn't changed. On probiotics.   Has had two colonoscopies per patient and 'normal' per patient. No polyps. More recently did a 'stool test' ( no records that I can see) and was negative for blood per patient.   Left flank 'lump' several months , non tender, hasnt changed in size   syncope- 2 years ago; Started on meclizine for vertigo after syncopal episode. Told vasovagal episode . Normal cardiac work up per patient.         Evaluated by cardiology 2014, Dr. Sophronia Simas. Suspected syncope was vasovagal.  Echo 2014 EF 60%.    HISTORY:  Past Medical History:  Diagnosis Date  . Allergy    Past Surgical History:  Procedure Laterality Date  . ABDOMINAL HYSTERECTOMY    . BILATERAL OOPHORECTOMY    . CHOLECYSTECTOMY  1994   Family History  Problem Relation Age of Onset  . Kidney disease Mother        nephrolithiasis  . Dementia Mother   . Hypertension Mother   . Mental retardation Sister 8       possible autism,  now with depression  . Cancer Maternal Aunt        breast cancer  . Dementia Maternal Aunt   . Mental retardation Maternal Aunt   . Dementia Maternal Uncle   . Cancer Maternal Grandmother        breast cancer  . Heart attack Paternal Grandmother   . Colon cancer Neg Hx     Allergies: Patient has no known allergies. No current outpatient prescriptions on file prior to visit.   No  current facility-administered medications on file prior to visit.     Social History  Substance Use Topics  . Smoking status: Former Smoker    Packs/day: 1.00    Years: 5.00    Types: Cigarettes    Quit date: 04/14/1970  . Smokeless tobacco: Never Used  . Alcohol use 3.6 oz/week    2 Glasses of wine, 1 Cans of beer, 3 Shots of liquor per week     Comment: occassion    Review of Systems  Constitutional: Negative for chills and fever.  Respiratory: Negative for cough.   Cardiovascular: Negative for chest pain and palpitations.  Gastrointestinal: Positive for diarrhea. Negative for abdominal distention, abdominal pain, nausea and vomiting.      Objective:    BP (!) 146/82   Pulse 72   Temp 98.2 F (36.8 C) (Oral)   Ht 5' 3.75" (1.619 m)   Wt 222 lb 4.8 oz (100.8 kg)   SpO2 98%   BMI 38.46 kg/m  BP Readings from Last 3 Encounters:  07/26/17 (!) 146/82  12/15/16 136/68  09/18/13 128/74   Wt Readings from Last 3 Encounters:  07/26/17 222 lb 4.8 oz (100.8 kg)  12/15/16 236 lb (107 kg)  09/18/13 226 lb 8 oz (102.7  kg)    Physical Exam  Constitutional: She appears well-developed and well-nourished.  Eyes: Conjunctivae are normal.  Cardiovascular: Normal rate, regular rhythm, normal heart sounds and normal pulses.   Pulmonary/Chest: Effort normal and breath sounds normal. She has no wheezes. She has no rhonchi. She has no rales.  Neurological: She is alert.  Skin: Skin is warm and dry.     nondiscrete mass on left flank. Non tender. Non fluctuant.   Psychiatric: She has a normal mood and affect. Her speech is normal and behavior is normal. Thought content normal.  Vitals reviewed.      Assessment & Plan:   Problem List Items Addressed This Visit      Cardiovascular and Mediastinum   Vasovagal syncope    Last episode 2 years ago. Told vasovagal at that time.In patient's chart, did notice that her carotid ultrasound was not repeated. We will jointly agreed to  repeat this today although patient is asymptomatic. Advised if any recurrence of symptom, to let me know asap. Patient verbalized understanding.      RESOLVED: Syncope and collapse   Relevant Orders   US Carotid Duplex Bilateral     Other   Diarrhea - Primary    Patient declines colonoscopy today as concerned with anesthesia. Discussed with her that IBS is a diagnosis of exclusion and would feel more comfortable if she had more formal evaluation since diarrhea persistent. At this time she feels satisfied with workup that she's had the past suggestive of irritable bowel syndrome. Symptoms are not worsening or progressing per patient, she agreed to try when necessary Imodium and pending stool culture to ensure no underlying bacterial etiology. Advised patient keep me abreast of any new, worsening symptoms.      Relevant Orders   GI pathogen panel by PCR, stool   Lipoma of torso    Suspect lipoma. Pending [place].       Relevant Orders   [place] Misc Soft Tissue       I have discontinued Ms. Parr's aspirin, Calcium Carbonate-Vitamin D, chlorpheniramine, multivitamin, b complex vitamins, Cranberry, ibuprofen, folic acid, (Green Tea, Camillia sinensis, (GREEN TEA PO)), cetirizine, meclizine, Tdap, zoster vaccine live (PF), zoster vaccine live (PF), albuterol, azithromycin, and benzonatate.   No orders of the defined types were placed in this encounter.   Return precautions given.   Risks, benefits, and alternatives of the medications and treatment plan prescribed today were discussed, and patient expressed understanding.   Education regarding symptom management and diagnosis given to patient on AVS.  Continue to follow with Burnard Hawthorne, FNP for routine health maintenance.   Johney Frame and I agreed with plan.   Mable Paris, FNP

## 2017-08-02 ENCOUNTER — Other Ambulatory Visit: Payer: Medicare Other

## 2017-08-02 DIAGNOSIS — R197 Diarrhea, unspecified: Secondary | ICD-10-CM | POA: Diagnosis not present

## 2017-08-02 NOTE — Addendum Note (Signed)
Addended by: Arby Barrette on: 08/02/2017 01:32 PM   Modules accepted: Orders

## 2017-08-03 ENCOUNTER — Encounter: Payer: Self-pay | Admitting: Family

## 2017-08-03 ENCOUNTER — Ambulatory Visit (INDEPENDENT_AMBULATORY_CARE_PROVIDER_SITE_OTHER): Payer: Medicare Other | Admitting: Family

## 2017-08-03 VITALS — BP 138/76 | HR 77 | Temp 98.1°F | Ht 63.5 in | Wt 233.0 lb

## 2017-08-03 DIAGNOSIS — Z0000 Encounter for general adult medical examination without abnormal findings: Secondary | ICD-10-CM

## 2017-08-03 LAB — COMPREHENSIVE METABOLIC PANEL
ALBUMIN: 4.2 g/dL (ref 3.5–5.2)
ALK PHOS: 57 U/L (ref 39–117)
ALT: 12 U/L (ref 0–35)
AST: 15 U/L (ref 0–37)
BUN: 17 mg/dL (ref 6–23)
CO2: 26 mEq/L (ref 19–32)
Calcium: 9.3 mg/dL (ref 8.4–10.5)
Chloride: 103 mEq/L (ref 96–112)
Creatinine, Ser: 0.8 mg/dL (ref 0.40–1.20)
GFR: 75.21 mL/min (ref >60.00)
Glucose, Bld: 95 mg/dL (ref 70–99)
Potassium: 4.5 mEq/L (ref 3.5–5.1)
SODIUM: 139 meq/L (ref 135–145)
TOTAL PROTEIN: 6.4 g/dL (ref 6.0–8.3)
Total Bilirubin: 0.5 mg/dL (ref 0.2–1.2)

## 2017-08-03 LAB — CBC WITH DIFFERENTIAL/PLATELET
BASOS ABS: 0.1 10*3/uL (ref 0.0–0.1)
Basophils Relative: 0.9 % (ref 0.0–3.0)
EOS ABS: 0.2 10*3/uL (ref 0.0–0.7)
Eosinophils Relative: 2.3 % (ref 0.0–5.0)
HEMATOCRIT: 42.3 % (ref 36.0–46.0)
HEMOGLOBIN: 13.6 g/dL (ref 12.0–15.0)
Lymphocytes Relative: 25.4 % (ref 12.0–46.0)
Lymphs Abs: 1.7 10*3/uL (ref 0.7–4.0)
MCHC: 32.1 g/dL (ref 30.0–36.0)
MCV: 85.2 fl (ref 78.0–100.0)
MONO ABS: 0.4 10*3/uL (ref 0.1–1.0)
Monocytes Relative: 6 % (ref 3.0–12.0)
Neutro Abs: 4.4 10*3/uL (ref 1.4–7.7)
Neutrophils Relative %: 65.4 % (ref 43.0–77.0)
Platelets: 190 10*3/uL (ref 150.0–400.0)
RBC: 4.97 Mil/uL (ref 3.87–5.11)
RDW: 14.9 % (ref 11.5–15.5)
WBC: 6.7 10*3/uL (ref 4.0–10.5)

## 2017-08-03 LAB — TSH: TSH: 2.97 u[IU]/mL (ref 0.35–4.50)

## 2017-08-03 LAB — VITAMIN D 25 HYDROXY (VIT D DEFICIENCY, FRACTURES): VITD: 23.61 ng/mL — ABNORMAL LOW (ref 30.00–100.00)

## 2017-08-03 LAB — B12 AND FOLATE PANEL
Folate: 11.3 ng/mL (ref >5.9)
Vitamin B-12: 301 pg/mL (ref 211–911)

## 2017-08-03 LAB — HEMOGLOBIN A1C: HEMOGLOBIN A1C: 5.9 % (ref 4.6–6.5)

## 2017-08-03 MED ORDER — PNEUMOCOCCAL 13-VAL CONJ VACC IM SUSP
0.5000 mL | Freq: Once | INTRAMUSCULAR | Status: AC
  Administered 2017-08-03: 0.5 mL via INTRAMUSCULAR

## 2017-08-03 NOTE — Assessment & Plan Note (Addendum)
Awaiting cologuard results. Clinical breast exam performed today. Prevnar 13 given to patient. Screening labs ordered, consented for hepatitis C. Encouraged regular exercise. Ordered mammogram and patient understands to schedule this. Declines pelvic exam in the history in the context of history of hysterectomy and no pelvic complaints today. Ordered DEXA scan.

## 2017-08-03 NOTE — Progress Notes (Signed)
Subjective:    Patient ID: Krista Gentry, female    DOB: 11-12-1946, 71 y.o.   MRN: [id number]  CC: Krista Gentry is a 71 y.o. female who presents today for physical exam.    HPI: Feeling well No complaints.     Colorectal Cancer Screening: due; has turned in Cologuard yesterday. No family h/o colon cancer.   Breast Cancer Screening: Mammogram due Cervical Cancer Screening: History of hysterectomy; no vaginal bleeding Bone Health screening/DEXA for 65+: Due Lung Cancer Screening: Doesn't have 30 year pack year history and age > 77 years.       Tetanus - UTD        Pneumococcal - Candidate for, prevnar Hepatitis C screening - Candidate for, consents. Labs: Screening labs today. Exercise: Gets regular exercise.  Alcohol use: occaisonal Smoking/tobacco use: former smoker; only smoked for 5 years Regular dental exams: UTD Wears seat belt: Yes. Skin: follows with dermatology; no h/o skin cancer  HISTORY:  Past Medical History:  Diagnosis Date  . Allergy     Past Surgical History:  Procedure Laterality Date  . ABDOMINAL HYSTERECTOMY    . BILATERAL OOPHORECTOMY    . CHOLECYSTECTOMY  1994   Family History  Problem Relation Age of Onset  . Kidney disease Mother        nephrolithiasis  . Dementia Mother   . Hypertension Mother   . Mental retardation Sister 8       possible autism,  now with depression  . Cancer Maternal Aunt        breast cancer  . Dementia Maternal Aunt   . Mental retardation Maternal Aunt   . Dementia Maternal Uncle   . Cancer Maternal Grandmother        breast cancer  . Heart attack Paternal Grandmother   . Colon cancer Neg Hx       ALLERGIES: Patient has no known allergies.  No current outpatient prescriptions on file prior to visit.   No current facility-administered medications on file prior to visit.     Social History  Substance Use Topics  . Smoking status: Former Smoker    Packs/day: 1.00    Years: 5.00    Types:  Cigarettes    Quit date: 04/14/1970  . Smokeless tobacco: Never Used  . Alcohol use 3.6 oz/week    2 Glasses of wine, 1 Cans of beer, 3 Shots of liquor per week     Comment: occassion    Review of Systems  Constitutional: Negative for chills, fever and unexpected weight change.  HENT: Negative for congestion.   Respiratory: Negative for cough.   Cardiovascular: Negative for chest pain, palpitations and leg swelling.  Gastrointestinal: Negative for nausea and vomiting.  Musculoskeletal: Negative for arthralgias and myalgias.  Skin: Negative for rash.  Neurological: Negative for headaches.  Hematological: Negative for adenopathy.  Psychiatric/Behavioral: Negative for confusion.      Objective:    BP 138/76   Pulse 77   Temp 98.1 F (36.7 C) (Oral)   Ht 5' 3.5" (1.613 m)   Wt 233 lb (105.7 kg)   SpO2 94%   BMI 40.63 kg/m   BP Readings from Last 3 Encounters:  08/03/17 138/76  07/26/17 (!) 146/82  12/15/16 136/68   Wt Readings from Last 3 Encounters:  08/03/17 233 lb (105.7 kg)  07/26/17 222 lb 4.8 oz (100.8 kg)  12/15/16 236 lb (107 kg)    Physical Exam  Constitutional: She appears well-developed and well-nourished.  Eyes: Conjunctivae are normal.  Neck: No thyroid mass and no thyromegaly present.  Cardiovascular: Normal rate, regular rhythm, normal heart sounds and normal pulses.   Pulmonary/Chest: Effort normal and breath sounds normal. She has no wheezes. She has no rhonchi. She has no rales. Right breast exhibits no inverted nipple, no mass, no nipple discharge, no skin change and no tenderness. Left breast exhibits no inverted nipple, no mass, no nipple discharge, no skin change and no tenderness. Breasts are symmetrical.  CBE performed.   Lymphadenopathy:       Head (right side): No submental, no submandibular, no tonsillar, no preauricular, no posterior auricular and no occipital adenopathy present.       Head (left side): No submental, no submandibular, no  tonsillar, no preauricular, no posterior auricular and no occipital adenopathy present.    She has no cervical adenopathy.       Right cervical: No superficial cervical, no deep cervical and no posterior cervical adenopathy present.      Left cervical: No superficial cervical, no deep cervical and no posterior cervical adenopathy present.    She has no axillary adenopathy.  Neurological: She is alert.  Skin: Skin is warm and dry.  Psychiatric: She has a normal mood and affect. Her speech is normal and behavior is normal. Thought content normal.  Vitals reviewed.      Assessment & Plan:   Problem List Items Addressed This Visit      Other   Routine general medical examination at a health care facility - Primary    Awaiting cologuard results. Clinical breast exam performed today. Prevnar 13 given to patient. Screening labs ordered, consented for hepatitis C. Encouraged regular exercise. Ordered mammogram and patient understands to schedule this. Declines pelvic exam in the history in the context of history of hysterectomy and no pelvic complaints today. Ordered DEXA scan.      Relevant Medications   pneumococcal 13-valent conjugate vaccine (PREVNAR 13) injection 0.5 mL (Completed)   Other Relevant Orders   Comprehensive metabolic panel   CBC with Differential/Platelet   Hemoglobin A1c   TSH   VITAMIN D 25 Hydroxy (Vit-D Deficiency, Fractures)   MM SCREENING BREAST TOMO BILATERAL   DG Bone Density   B12 and Folate Panel   Hepatitis C antibody       We administered pneumococcal 13-valent conjugate vaccine.   Meds ordered this encounter  Medications  . pneumococcal 13-valent conjugate vaccine (PREVNAR 13) injection 0.5 mL    Return precautions given.   Risks, benefits, and alternatives of the medications and treatment plan prescribed today were discussed, and patient expressed understanding.   Education regarding symptom management and diagnosis given to patient on AVS.    Continue to follow with Burnard Hawthorne, FNP for routine health maintenance.   Krista Gentry and I agreed with plan.   Krista Paris, FNP

## 2017-08-03 NOTE — Patient Instructions (Addendum)
Labs today  DEXA scan  We placed a referral. Mammogram this year. I asked that you call one the below locations and schedule this when it is convenient for you.   If you have dense breasts, you may ask for 3D mammogram over the traditional 2D mammogram as new evidence suggest 3D is superior. Please note that NOT all insurance companies cover 3D and you may have to pay a higher copay. You may call your insurance company to further clarify your benefits.   Options for [place]  [place], [place]  * [facility] [place], [place] * Note if you ask for 3D mammogram at this location, you must request [place], [place] location*   Health Maintenance, Female Adopting a healthy lifestyle and getting preventive care can go a long way to promote health and wellness. Talk with your health care provider about what schedule of regular examinations is right for you. This is a good chance for you to check in with your provider about disease prevention and staying healthy. In between checkups, there are plenty of things you can do on your own. Experts have done a lot of research about which lifestyle changes and preventive measures are most likely to keep you healthy. Ask your health care provider for more information. Weight and diet Eat a healthy diet  Be sure to include plenty of vegetables, fruits, low-fat dairy products, and lean protein.  Do not eat a lot of foods high in solid fats, added sugars, or salt.  Get regular exercise. This is one of the most important things you can do for your health. ? Most adults should exercise for at least 150 minutes each week. The exercise should increase your heart rate and make you sweat (moderate-intensity exercise). ? Most adults should also do strengthening exercises at least twice a week. This is in  addition to the moderate-intensity exercise.  Maintain a healthy weight  Body mass index (BMI) is a measurement that can be used to identify possible weight problems. It estimates body fat based on height and weight. Your health care provider can help determine your BMI and help you achieve or maintain a healthy weight.  For females 107 years of age and older: ? A BMI below 18.5 is considered underweight. ? A BMI of 18.5 to 24.9 is normal. ? A BMI of 25 to 29.9 is considered overweight. ? A BMI of 30 and above is considered obese.  Watch levels of cholesterol and blood lipids  You should start having your blood tested for lipids and cholesterol at 71 years of age, then have this test every 5 years.  You may need to have your cholesterol levels checked more often if: ? Your lipid or cholesterol levels are high. ? You are older than 71 years of age. ? You are at high risk for heart disease.  Cancer screening Lung Cancer  Lung cancer screening is recommended for adults 79-58 years old who are at high risk for lung cancer because of a history of smoking.  A yearly low-dose CT scan of the lungs is recommended for people who: ? Currently smoke. ? Have quit within the past 15 years. ? Have at least a 30-pack-year history of smoking. A pack year is smoking an average of one pack of cigarettes a day for 1 year.  Yearly screening should continue  until it has been 15 years since you quit.  Yearly screening should stop if you develop a health problem that would prevent you from having lung cancer treatment.  Breast Cancer  Practice breast self-awareness. This means understanding how your breasts normally appear and feel.  It also means doing regular breast self-exams. Let your health care provider know about any changes, no matter how small.  If you are in your 20s or 30s, you should have a clinical breast exam (CBE) by a health care provider every 1-3 years as part of a regular health  exam.  If you are 67 or older, have a CBE every year. Also consider having a breast X-ray (mammogram) every year.  If you have a family history of breast cancer, talk to your health care provider about genetic screening.  If you are at high risk for breast cancer, talk to your health care provider about having an MRI and a mammogram every year.  Breast cancer gene (BRCA) assessment is recommended for women who have family members with BRCA-related cancers. BRCA-related cancers include: ? Breast. ? Ovarian. ? Tubal. ? Peritoneal cancers.  Results of the assessment will determine the need for genetic counseling and BRCA1 and BRCA2 testing.  Cervical Cancer Your health care provider may recommend that you be screened regularly for cancer of the pelvic organs (ovaries, uterus, and vagina). This screening involves a pelvic examination, including checking for microscopic changes to the surface of your cervix (Pap test). You may be encouraged to have this screening done every 3 years, beginning at age 24.  For women ages 99-65, health care providers may recommend pelvic exams and Pap testing every 3 years, or they may recommend the Pap and pelvic exam, combined with testing for human papilloma virus (HPV), every 5 years. Some types of HPV increase your risk of cervical cancer. Testing for HPV may also be done on women of any age with unclear Pap test results.  Other health care providers may not recommend any screening for nonpregnant women who are considered low risk for pelvic cancer and who do not have symptoms. Ask your health care provider if a screening pelvic exam is right for you.  If you have had past treatment for cervical cancer or a condition that could lead to cancer, you need Pap tests and screening for cancer for at least 20 years after your treatment. If Pap tests have been discontinued, your risk factors (such as having a new sexual partner) need to be reassessed to determine if  screening should resume. Some women have medical problems that increase the chance of getting cervical cancer. In these cases, your health care provider may recommend more frequent screening and Pap tests.  Colorectal Cancer  This type of cancer can be detected and often prevented.  Routine colorectal cancer screening usually begins at 71 years of age and continues through 71 years of age.  Your health care provider may recommend screening at an earlier age if you have risk factors for colon cancer.  Your health care provider may also recommend using home test kits to check for hidden blood in the stool.  A small camera at the end of a tube can be used to examine your colon directly (sigmoidoscopy or colonoscopy). This is done to check for the earliest forms of colorectal cancer.  Routine screening usually begins at age 22.  Direct examination of the colon should be repeated every 5-10 years through 71 years of age. However, you may need  to be screened more often if early forms of precancerous polyps or small growths are found.  Skin Cancer  Check your skin from head to toe regularly.  Tell your health care provider about any new moles or changes in moles, especially if there is a change in a mole's shape or color.  Also tell your health care provider if you have a mole that is larger than the size of a pencil eraser.  Always use sunscreen. Apply sunscreen liberally and repeatedly throughout the day.  Protect yourself by wearing long sleeves, pants, a wide-brimmed hat, and sunglasses whenever you are outside.  Heart disease, diabetes, and high blood pressure  High blood pressure causes heart disease and increases the risk of stroke. High blood pressure is more likely to develop in: ? People who have blood pressure in the high end of the normal range (130-139/85-89 mm Hg). ? People who are overweight or obese. ? People who are African American.  If you are 23-58 years of age, have  your blood pressure checked every 3-5 years. If you are 76 years of age or older, have your blood pressure checked every year. You should have your blood pressure measured twice-once when you are at a hospital or clinic, and once when you are not at a hospital or clinic. Record the average of the two measurements. To check your blood pressure when you are not at a hospital or clinic, you can use: ? An automated blood pressure machine at a pharmacy. ? A home blood pressure monitor.  If you are between 80 years and 3 years old, ask your health care provider if you should take aspirin to prevent strokes.  Have regular diabetes screenings. This involves taking a blood sample to check your fasting blood sugar level. ? If you are at a normal weight and have a low risk for diabetes, have this test once every three years after 71 years of age. ? If you are overweight and have a high risk for diabetes, consider being tested at a younger age or more often. Preventing infection Hepatitis B  If you have a higher risk for hepatitis B, you should be screened for this virus. You are considered at high risk for hepatitis B if: ? You were born in a country where hepatitis B is common. Ask your health care provider which countries are considered high risk. ? Your parents were born in a high-risk country, and you have not been immunized against hepatitis B (hepatitis B vaccine). ? You have HIV or AIDS. ? You use needles to inject street drugs. ? You live with someone who has hepatitis B. ? You have had sex with someone who has hepatitis B. ? You get hemodialysis treatment. ? You take certain medicines for conditions, including cancer, organ transplantation, and autoimmune conditions.  Hepatitis C  Blood testing is recommended for: ? Everyone born from [birth year] through 1965. ? Anyone with known risk factors for hepatitis C.  Sexually transmitted infections (STIs)  You should be screened for sexually  transmitted infections (STIs) including gonorrhea and chlamydia if: ? You are sexually active and are younger than 71 years of age. ? You are older than 71 years of age and your health care provider tells you that you are at risk for this type of infection. ? Your sexual activity has changed since you were last screened and you are at an increased risk for chlamydia or gonorrhea. Ask your health care provider if you are at risk.  If you do not have HIV, but are at risk, it may be recommended that you take a prescription medicine daily to prevent HIV infection. This is called pre-exposure prophylaxis (PrEP). You are considered at risk if: ? You are sexually active and do not regularly use condoms or know the HIV status of your partner(s). ? You take drugs by injection. ? You are sexually active with a partner who has HIV.  Talk with your health care provider about whether you are at high risk of being infected with HIV. If you choose to begin PrEP, you should first be tested for HIV. You should then be tested every 3 months for as long as you are taking PrEP. Pregnancy  If you are premenopausal and you may become pregnant, ask your health care provider about preconception counseling.  If you may become pregnant, take 400 to 800 micrograms (mcg) of folic acid every day.  If you want to prevent pregnancy, talk to your health care provider about birth control (contraception). Osteoporosis and menopause  Osteoporosis is a disease in which the bones lose minerals and strength with aging. This can result in serious bone fractures. Your risk for osteoporosis can be identified using a bone density scan.  If you are 69 years of age or older, or if you are at risk for osteoporosis and fractures, ask your health care provider if you should be screened.  Ask your health care provider whether you should take a calcium or vitamin D supplement to lower your risk for osteoporosis.  Menopause may have  certain physical symptoms and risks.  Hormone replacement therapy may reduce some of these symptoms and risks. Talk to your health care provider about whether hormone replacement therapy is right for you. Follow these instructions at home:  Schedule regular health, dental, and eye exams.  Stay current with your immunizations.  Do not use any tobacco products including cigarettes, chewing tobacco, or electronic cigarettes.  If you are pregnant, do not drink alcohol.  If you are breastfeeding, limit how much and how often you drink alcohol.  Limit alcohol intake to no more than 1 drink per day for nonpregnant women. One drink equals 12 ounces of beer, 5 ounces of wine, or 1 ounces of hard liquor.  Do not use street drugs.  Do not share needles.  Ask your health care provider for help if you need support or information about quitting drugs.  Tell your health care provider if you often feel depressed.  Tell your health care provider if you have ever been abused or do not feel safe at home. This information is not intended to replace advice given to you by your health care provider. Make sure you discuss any questions you have with your health care provider. Document Released: 06/21/2011 Document Revised: 05/13/2016 Document Reviewed: 09/09/2015 Elsevier Interactive Patient Education  [employer].

## 2017-08-03 NOTE — Progress Notes (Signed)
Pre visit review using our clinic review tool, if applicable. No additional management support is needed unless otherwise documented below in the visit note. 

## 2017-08-04 ENCOUNTER — Encounter: Payer: Self-pay | Admitting: Family

## 2017-08-04 ENCOUNTER — Other Ambulatory Visit: Payer: Self-pay | Admitting: Family

## 2017-08-04 ENCOUNTER — Other Ambulatory Visit (INDEPENDENT_AMBULATORY_CARE_PROVIDER_SITE_OTHER): Payer: Medicare Other

## 2017-08-04 DIAGNOSIS — E785 Hyperlipidemia, unspecified: Secondary | ICD-10-CM

## 2017-08-04 DIAGNOSIS — Z13220 Encounter for screening for lipoid disorders: Secondary | ICD-10-CM | POA: Diagnosis not present

## 2017-08-04 LAB — HEPATITIS C ANTIBODY: HCV Ab: NONREACTIVE

## 2017-08-04 LAB — GASTROINTESTINAL PATHOGEN PANEL PCR
C. DIFFICILE TOX A/B, PCR: NOT DETECTED
CAMPYLOBACTER, PCR: NOT DETECTED
CRYPTOSPORIDIUM, PCR: NOT DETECTED
E coli (ETEC) LT/ST PCR: NOT DETECTED
E coli (STEC) stx1/stx2, PCR: NOT DETECTED
E coli 0157, PCR: NOT DETECTED
GIARDIA LAMBLIA, PCR: NOT DETECTED
NOROVIRUS, PCR: NOT DETECTED
ROTAVIRUS, PCR: NOT DETECTED
Salmonella, PCR: NOT DETECTED
Shigella, PCR: NOT DETECTED

## 2017-08-04 LAB — LIPID PANEL
CHOLESTEROL: 187 mg/dL (ref 0–200)
HDL: 67.7 mg/dL (ref >39.00)
LDL CALC: 96 mg/dL (ref 0–99)
NonHDL: 118.97
TRIGLYCERIDES: 115 mg/dL (ref 0.0–149.0)
Total CHOL/HDL Ratio: 3
VLDL: 23 mg/dL (ref 0.0–40.0)

## 2017-08-04 MED ORDER — PRAVASTATIN SODIUM 40 MG PO TABS: 40.0000 mg | ORAL_TABLET | Freq: Every day | ORAL | 2 refills | Status: DC

## 2017-08-04 NOTE — Progress Notes (Signed)
Added on lipid panel -can we still add?

## 2017-08-04 NOTE — Progress Notes (Signed)
Will send add-on fax to [facility] to add

## 2017-08-06 ENCOUNTER — Other Ambulatory Visit: Payer: Self-pay | Admitting: Family

## 2017-08-11 ENCOUNTER — Other Ambulatory Visit: Payer: Self-pay | Admitting: Family

## 2017-08-11 ENCOUNTER — Ambulatory Visit: Payer: Medicare Other

## 2017-08-11 DIAGNOSIS — D171 Benign lipomatous neoplasm of skin and subcutaneous tissue of trunk: Secondary | ICD-10-CM

## 2017-08-17 ENCOUNTER — Ambulatory Visit: Payer: Medicare Other

## 2017-08-25 ENCOUNTER — Ambulatory Visit: Payer: Medicare Other

## 2017-08-26 ENCOUNTER — Ambulatory Visit
Admission: RE | Admit: 2017-08-26 | Discharge: 2017-08-26 | Disposition: A | Discharge status: Home / Self Care | Payer: Medicare Other | Source: Ambulatory Visit | Attending: Family | Admitting: Family

## 2017-08-26 ENCOUNTER — Encounter: Payer: Self-pay | Admitting: Family

## 2017-08-26 DIAGNOSIS — R55 Syncope and collapse: Secondary | ICD-10-CM

## 2017-08-26 DIAGNOSIS — Z0000 Encounter for general adult medical examination without abnormal findings: Secondary | ICD-10-CM

## 2017-08-26 DIAGNOSIS — I6523 Occlusion and stenosis of bilateral carotid arteries: Secondary | ICD-10-CM | POA: Diagnosis not present

## 2017-08-26 DIAGNOSIS — Z1231 Encounter for screening mammogram for malignant neoplasm of breast: Secondary | ICD-10-CM | POA: Insufficient documentation

## 2017-08-26 DIAGNOSIS — M8588 Other specified disorders of bone density and structure, other site: Secondary | ICD-10-CM | POA: Insufficient documentation

## 2017-08-26 DIAGNOSIS — D171 Benign lipomatous neoplasm of skin and subcutaneous tissue of trunk: Secondary | ICD-10-CM | POA: Diagnosis not present

## 2017-08-26 DIAGNOSIS — Z13820 Encounter for screening for osteoporosis: Secondary | ICD-10-CM | POA: Diagnosis present

## 2017-08-26 DIAGNOSIS — R1904 Left lower quadrant abdominal swelling, mass and lump: Secondary | ICD-10-CM | POA: Diagnosis not present

## 2017-09-01 ENCOUNTER — Other Ambulatory Visit: Payer: Self-pay | Admitting: *Deleted

## 2017-09-01 ENCOUNTER — Inpatient Hospital Stay
Admission: RE | Admit: 2017-09-01 | Discharge: 2017-09-01 | Disposition: A | Discharge status: Home / Self Care | Payer: Self-pay | Source: Ambulatory Visit | Attending: *Deleted | Admitting: *Deleted

## 2017-09-01 DIAGNOSIS — Z9289 Personal history of other medical treatment: Secondary | ICD-10-CM

## 2017-09-05 ENCOUNTER — Encounter: Payer: Self-pay | Admitting: Family

## 2017-09-08 ENCOUNTER — Encounter: Payer: Self-pay | Admitting: Family

## 2017-09-26 ENCOUNTER — Telehealth: Payer: Self-pay | Admitting: Family

## 2017-09-26 NOTE — Telephone Encounter (Signed)
Pt called and stated that she has been having nose bleeds. She states that she had 2 Friday, 1 yesterday morning and one at 2:30 this morning. Pt states that she went to get saline solution yesterday afternoon. Pt also states that she has the cologuard kit and worried that it may cause ian impact. Please advise, thank you!  Call pt [phone]

## 2017-09-26 NOTE — Telephone Encounter (Signed)
I don't see results from Cologuard however thought she mailed back in August?  Please advise f/u appt- with me or colleague so we can evaluate her nose.

## 2017-09-26 NOTE — Telephone Encounter (Signed)
Patient has been scheduled with you on Thursday at 9:30. Offered earlier appt with [place]. Patient prefers to see you. Patient also says that she has not sent in a cologuard yet. She just received the package last week.

## 2017-09-26 NOTE — Telephone Encounter (Signed)
noted 

## 2017-09-26 NOTE — Telephone Encounter (Signed)
Patient has been having random nose bleeds since Friday. She is not having any other symptoms. Started saline with aloe nasal spray as recommended by pharmacy on Saturday. Patient has had a lot of sinus issues in the past but does not recall having hx of nose bleeds. Patient stopped all of medication on Friday because she did not know what was causing the nose bleeds. Patient says the first bleed was an excessive amount of blood but the rest of them were not a lot. BP 136/76 P-87. Also, patient is concerned that if she swallows any blood from the nose bleeds that it will effect her cologuard results? Please advise.

## 2017-09-27 DIAGNOSIS — Z1211 Encounter for screening for malignant neoplasm of colon: Secondary | ICD-10-CM | POA: Diagnosis not present

## 2017-09-27 DIAGNOSIS — Z1212 Encounter for screening for malignant neoplasm of rectum: Secondary | ICD-10-CM | POA: Diagnosis not present

## 2017-09-27 LAB — COLOGUARD: Cologuard: NEGATIVE

## 2017-09-29 ENCOUNTER — Encounter: Payer: Self-pay | Admitting: Family

## 2017-09-29 ENCOUNTER — Ambulatory Visit (INDEPENDENT_AMBULATORY_CARE_PROVIDER_SITE_OTHER): Payer: Medicare Other | Admitting: Family

## 2017-09-29 VITALS — BP 126/78 | HR 73 | Temp 98.4°F | Ht 63.5 in | Wt 228.2 lb

## 2017-09-29 DIAGNOSIS — R040 Epistaxis: Secondary | ICD-10-CM

## 2017-09-29 MED ORDER — MUPIROCIN 2 % EX OINT: 1.0000 "application " | TOPICAL_OINTMENT | Freq: Two times a day (BID) | CUTANEOUS | 0 refills | Status: DC

## 2017-09-29 NOTE — Patient Instructions (Signed)
Please stay very vigilant and if nose bleed recurs, please let me know.   Humidifer in bedroom  Vaseline  Hold zyrtec for now

## 2017-09-29 NOTE — Progress Notes (Signed)
Subjective:    Patient ID: Krista Gentry, female    DOB: December 16, 1946, 71 y.o.   MRN: [id number]  CC: Krista Gentry is a 71 y.o. female who presents today for an acute visit.    HPI: Has 4 nose bleeds in total , two nose bleeds 6 days ago, since resolved. Occurs on right side of nose.  Able to apply pressure and stopped within 10-15 minutes for first episode. Second episode was able to stop in a couple of minutes.  Had not been using nasal sprays. Had been zyrtec for post nasal drip, ASA - has stopped them since.  Notes sinus pressure and left ear pressure over past week. No fever, ear discharge.   Last nose bleed 4 days ago.   Has started using nasal saline and thinks helped.     HISTORY:  Past Medical History:  Diagnosis Date  . Allergy    Past Surgical History:  Procedure Laterality Date  . ABDOMINAL HYSTERECTOMY    . BILATERAL OOPHORECTOMY    . BREAST BIOPSY    . CHOLECYSTECTOMY  1994   Family History  Problem Relation Age of Onset  . Kidney disease Mother        nephrolithiasis  . Dementia Mother   . Hypertension Mother   . Mental retardation Sister 8       possible autism,  now with depression  . Cancer Maternal Aunt        breast cancer  . Dementia Maternal Aunt   . Mental retardation Maternal Aunt   . Dementia Maternal Uncle   . Cancer Maternal Grandmother        breast cancer  . Heart attack Paternal Grandmother   . Colon cancer Neg Hx     Allergies: Patient has no known allergies. No current outpatient prescriptions on file prior to visit.   No current facility-administered medications on file prior to visit.     Social History  Substance Use Topics  . Smoking status: Former Smoker    Packs/day: 1.00    Years: 5.00    Types: Cigarettes    Quit date: 04/14/1970  . Smokeless tobacco: Never Used  . Alcohol use 3.6 oz/week    2 Glasses of wine, 1 Cans of beer, 3 Shots of liquor per week     Comment: occassion    Review of Systems    Constitutional: Negative for chills and fever.  HENT: Positive for congestion, ear pain, nosebleeds, postnasal drip and sinus pressure. Negative for ear discharge and sore throat.   Respiratory: Negative for cough, shortness of breath and wheezing.   Cardiovascular: Negative for chest pain and palpitations.  Gastrointestinal: Negative for nausea and vomiting.      Objective:    BP 126/78   Pulse 73   Temp 98.4 F (36.9 C) (Oral)   Ht 5' 3.5" (1.613 m)   Wt 228 lb 3.2 oz (103.5 kg)   SpO2 94%   BMI 39.79 kg/m    Physical Exam  Constitutional: She appears well-developed and well-nourished.  HENT:  Head: Normocephalic and atraumatic.  Right Ear: Hearing, tympanic membrane, external ear and ear canal normal. No drainage, swelling or tenderness. No foreign bodies. Tympanic membrane is not erythematous and not bulging. No middle ear effusion. No decreased hearing is noted.  Left Ear: Hearing, tympanic membrane, external ear and ear canal normal. No drainage, swelling or tenderness. No foreign bodies. Tympanic membrane is not erythematous and not bulging.  No middle ear  effusion. No decreased hearing is noted.  Nose: Nose normal. No rhinorrhea. Right sinus exhibits no maxillary sinus tenderness and no frontal sinus tenderness. Left sinus exhibits no maxillary sinus tenderness and no frontal sinus tenderness.    Mouth/Throat: Uvula is midline, oropharynx is clear and moist and mucous membranes are normal. No oropharyngeal exudate, posterior oropharyngeal edema, posterior oropharyngeal erythema or tonsillar abscesses.  Bleeding scabbed over lesion < 1cm in diameter noted right nares   Eyes: Conjunctivae are normal.  Cardiovascular: Regular rhythm, normal heart sounds and normal pulses.   Pulmonary/Chest: Effort normal and breath sounds normal. She has no wheezes. She has no rhonchi. She has no rales.  Lymphadenopathy:       Head (right side): No submental, no submandibular, no tonsillar,  no preauricular, no posterior auricular and no occipital adenopathy present.       Head (left side): No submental, no submandibular, no tonsillar, no preauricular, no posterior auricular and no occipital adenopathy present.    She has no cervical adenopathy.  Neurological: She is alert.  Skin: Skin is warm and dry.  Psychiatric: She has a normal mood and affect. Her speech is normal and behavior is normal. Thought content normal.  Vitals reviewed.      Assessment & Plan:   Problem List Items Addressed This Visit      Other   Epistaxis - Primary    Working diagnosis of dry mucosa which has caused nose bleed. Identifiable lesion, with pressure stopped bleeding .trial of topical bactroban, if no improvement, patient will let me know and we will consult ent for evaluation/biopsy of lesion. Patient verbalized understanding.       Relevant Medications   mupirocin ointment (BACTROBAN) 2 %        I am having Krista Gentry start on mupirocin ointment.   Meds ordered this encounter  Medications  . mupirocin ointment (BACTROBAN) 2 %    Sig: Place 1 application into the nose 2 (two) times daily.    Dispense:  22 g    Refill:  0    Order Specific Question:   Supervising Provider    Answer:   Krista Gentry [[id number]]    Return precautions given.   Risks, benefits, and alternatives of the medications and treatment plan prescribed today were discussed, and patient expressed understanding.   Education regarding symptom management and diagnosis given to patient on AVS.  Continue to follow with Krista Hawthorne, FNP for routine health maintenance.   Krista Gentry and I agreed with plan.   Krista Paris, FNP

## 2017-09-29 NOTE — Progress Notes (Signed)
Pre visit review using our clinic review tool, if applicable. No additional management support is needed unless otherwise documented below in the visit note. 

## 2017-09-29 NOTE — Assessment & Plan Note (Signed)
Working diagnosis of dry mucosa which has caused nose bleed. Identifiable lesion, with pressure stopped bleeding .trial of topical bactroban, if no improvement, patient will let me know and we will consult ent for evaluation/biopsy of lesion. Patient verbalized understanding.

## 2017-10-06 ENCOUNTER — Telehealth: Payer: Self-pay | Admitting: Family

## 2017-10-06 NOTE — Telephone Encounter (Signed)
Notified pt of results. Pt verbalized understanding and had no questions or concerns.

## 2017-10-06 NOTE — Telephone Encounter (Signed)
Call pt     let  know Cologuard NEGATIVE which indicates lower likelihood of cancer or pre cancer. You will need to repeat test in 3 years or sooner if any symptoms including blood in stool, change in bowel habits.   

## 2018-03-16 DIAGNOSIS — H524 Presbyopia: Secondary | ICD-10-CM | POA: Diagnosis not present

## 2018-07-14 ENCOUNTER — Other Ambulatory Visit: Payer: Self-pay | Admitting: Family

## 2018-07-14 DIAGNOSIS — Z1231 Encounter for screening mammogram for malignant neoplasm of breast: Secondary | ICD-10-CM

## 2018-08-28 ENCOUNTER — Ambulatory Visit
Admission: RE | Admit: 2018-08-28 | Discharge: 2018-08-28 | Disposition: A | Discharge status: Home / Self Care | Payer: Medicare Other | Source: Ambulatory Visit | Attending: Family | Admitting: Family

## 2018-08-28 DIAGNOSIS — Z1231 Encounter for screening mammogram for malignant neoplasm of breast: Secondary | ICD-10-CM | POA: Diagnosis not present

## 2018-09-27 ENCOUNTER — Ambulatory Visit (INDEPENDENT_AMBULATORY_CARE_PROVIDER_SITE_OTHER): Payer: Medicare Other

## 2018-09-27 VITALS — BP 118/64 | HR 65 | Temp 98.1°F | Resp 15 | Ht 63.75 in | Wt 233.8 lb

## 2018-09-27 DIAGNOSIS — Z0000 Encounter for general adult medical examination without abnormal findings: Secondary | ICD-10-CM | POA: Diagnosis not present

## 2018-09-27 NOTE — Progress Notes (Addendum)
Subjective:   Krista Gentry is a 72 y.o. female who presents for an Initial Medicare Annual Wellness Visit.  Review of Systems    No ROS.  Medicare Wellness Visit. Additional risk factors are reflected in the social history.  Cardiac Risk Factors include: advanced age (>36men, >17 women);obesity (BMI >30kg/m2)     Objective:    Today's Vitals   09/27/18 0840  BP: 118/64  Pulse: 65  Resp: 15  Temp: 98.1 F (36.7 C)  TempSrc: Oral  SpO2: 95%  Weight: 233 lb 12.8 oz (106.1 kg)  Height: 5' 3.75" (1.619 m)   Body mass index is 40.45 kg/m.  Advanced Directives 09/27/2018  Does Patient Have a Medical Advance Directive? Yes  Type of Advance Directive [place]  Does patient want to make changes to medical advance directive? No - Patient declined  Copy of [place] in Chart? No - copy requested    Current Medications (verified) Outpatient Encounter Medications as of 09/27/2018  Medication Sig  . [DISCONTINUED] mupirocin ointment (BACTROBAN) 2 % Place 1 application into the nose 2 (two) times daily.   No facility-administered encounter medications on file as of 09/27/2018.     Allergies (verified) Patient has no known allergies.   History: Past Medical History:  Diagnosis Date  . Allergy    Past Surgical History:  Procedure Laterality Date  . ABDOMINAL HYSTERECTOMY    . BILATERAL OOPHORECTOMY    . BREAST BIOPSY    . CHOLECYSTECTOMY  1994   Family History  Problem Relation Age of Onset  . Kidney disease Mother        nephrolithiasis  . Dementia Mother   . Hypertension Mother   . Mental retardation Sister 8       possible autism,  now with depression  . Dementia Sister   . Cancer Maternal Aunt        breast cancer  . Dementia Maternal Aunt   . Mental retardation Maternal Aunt   . Dementia Maternal Uncle   . Cancer Maternal Grandmother        breast cancer  . Heart attack Paternal Grandmother   . Colon cancer Neg Hx     Social History   Socioeconomic History  . Marital status: Married    Spouse name: Not on file  . Number of children: Not on file  . Years of education: Not on file  . Highest education level: Not on file  Occupational History  . Not on file  Social Needs  . Financial resource strain: Not hard at all  . Food insecurity:    Worry: Never true    Inability: Never true  . Transportation needs:    Medical: No    Non-medical: No  Tobacco Use  . Smoking status: Former Smoker    Packs/day: 1.00    Years: 5.00    Pack years: 5.00    Types: Cigarettes    Last attempt to quit: 04/14/1970    Years since quitting: 48.4  . Smokeless tobacco: Never Used  Substance and Sexual Activity  . Alcohol use: Yes    Alcohol/week: 6.0 standard drinks    Types: 2 Glasses of wine, 1 Cans of beer, 3 Shots of liquor per week    Comment: occassion  . Drug use: No  . Sexual activity: Not on file  Lifestyle  . Physical activity:    Days per week: 7 days    Minutes per session: 30 min  .  Stress: Not at all  Relationships  . Social connections:    Talks on phone: Not on file    Gets together: Not on file    Attends religious service: Not on file    Active member of club or organization: Not on file    Attends meetings of clubs or organizations: Not on file    Relationship status: Not on file  Other Topics Concern  . Not on file  Social History Narrative  . Not on file    Tobacco Counseling Counseling given: Not Answered   Clinical Intake:  Pre-visit preparation completed: Yes  Pain : No/denies pain     Nutritional Status: BMI > 30  Obese Diabetes: No  How often do you need to have someone help you when you read instructions, pamphlets, or other written materials from your doctor or pharmacy?: 1 - Never  Interpreter Needed?: No      Activities of Daily Living In your present state of health, do you have any difficulty performing the following activities: 09/27/2018  Hearing?  N  Vision? N  Difficulty concentrating or making decisions? N  Walking or climbing stairs? N  Dressing or bathing? N  Doing errands, shopping? N  Preparing Food and eating ? N  Using the Toilet? N  In the past six months, have you accidently leaked urine? N  Do you have problems with loss of bowel control? Y  Comment Followed by pcp. 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  Managing your Medications? N  Managing your Finances? N  Housekeeping or managing your Housekeeping? N  Some recent data might be hidden     Immunizations and Health Maintenance Immunization History  Administered Date(s) Administered  . Influenza Split 10/05/2014  . Influenza-Unspecified 09/06/2013  . PPD Test 08/15/2013  . Pneumococcal Conjugate-13 08/03/2017  . Pneumococcal Polysaccharide-23 08/15/2013  . Tdap 09/06/2013   Health Maintenance Due  Topic Date Due  . COLONOSCOPY  11/11/1996  . INFLUENZA VACCINE  07/20/2018    Patient Care Team: Burnard Hawthorne, FNP as PCP - General (Family Medicine)  Indicate any recent Medical Services you may have received from other than [facility] providers in the past year (date may be approximate).     Assessment:   This is a routine wellness examination for [name].  The goal of the wellness visit is to assist the patient how to close the gaps in care and create a preventative care plan for the patient.   The roster of all physicians providing medical care to patient is listed in the Snapshot section of the chart.  Osteoporosis risk reviewed.    Safety issues reviewed; Smoke and carbon monoxide detectors in the home. No firearms in the home. Wears seatbelts when driving or riding with others. No violence in the home.  They do not have excessive sun exposure.  Discussed the need for sun protection: hats, long sleeves and the use of sunscreen if there is significant sun exposure.  No failures at ADL's or  IADL's.    BMI- discussed the importance of a healthy diet, water intake and the benefits of aerobic exercise. Educational material provided.   24 hour diet recall: Regular diet  Dental- every 6 months.  Sleep patterns- Sleeps fair at night.    Influenza vaccine discussed. She plans to wait later in the season.   Patient Concerns:Numbness in R fingers with prolonged use. Intermittent numbness in feet and restless legs when she  removes shoes at the end of her day. Appointment offered; declined. Annual labs due. Scheduled for physical in the coming weeks and will monitor symptoms. Encouraged compression socks and elevate feet. Follow up with pcp sooner if symptoms worsen, or as needed.  Hearing/Vision screen Hearing Screening Comments: Patient is able to hear conversational tones without difficulty.  No issues reported.  Vision Screening Comments: Followed by [facility] Wears corrective lenses Last OV 2019 Visual acuity not assessed per patient preference since they have regular follow up with the ophthalmologist  Dietary issues and exercise activities discussed: Current Exercise Habits: Home exercise routine, Type of exercise: walking;calisthenics(bicycling, elliptical), Time (Minutes): 30, Frequency (Times/Week): 7, Weekly Exercise (Minutes/Week): 210, Intensity: Moderate  Goals      Patient Stated   . DIET - INCREASE WATER INTAKE (pt-stated)    . Increase physical activity (pt-stated)      Depression Screen PHQ 2/9 Scores 09/27/2018 09/29/2017 07/26/2017 12/15/2016 09/18/2013  PHQ - 2 Score 0 0 0 0 0    Fall Risk Fall Risk  09/27/2018 09/29/2017 07/26/2017 12/15/2016 09/18/2013  Falls in the past year? No No No No Yes  Number falls in past yr: - - - - 1  Injury with Fall? - - - - Yes  Comment - - - - ER Visit head injury  Risk Factor Category  - - - - High Fall Risk  Risk for fall due to : - - - - Other (Comment)  Risk for fall due to: Comment - - - - over active vagus  nerve   Cognitive Function: MMSE - Mini Mental State Exam 09/27/2018  Orientation to time 5  Orientation to Place 5  Registration 3  Attention/ Calculation 5  Recall 3  Language- name 2 objects 2  Language- repeat 1  Language- follow 3 step command 3  Language- read & follow direction 1  Write a sentence 1  Copy design 1  Total score 30        Screening Tests Health Maintenance  Topic Date Due  . COLONOSCOPY  11/11/1996  . INFLUENZA VACCINE  07/20/2018  . MAMMOGRAM  08/28/2020  . TETANUS/TDAP  09/07/2023  . DEXA SCAN  Completed  . Hepatitis C Screening  Completed  . PNA vac Low Risk Adult  Completed     Plan:    End of life planning; Advance aging; Advanced directives discussed. Copy of current HCPOA/Living Will requested.    I have personally reviewed and noted the following in the patient's chart:   . Medical and social history . Use of alcohol, tobacco or illicit drugs  . Current medications and supplements . Functional ability and status . Nutritional status . Physical activity . Advanced directives . List of other physicians . Hospitalizations, surgeries, and ER visits in previous 12 months . Vitals . Screenings to include cognitive, depression, and falls . Referrals and appointments  In addition, I have reviewed and discussed with patient certain preventive protocols, quality metrics, and best practice recommendations. A written personalized care plan for preventive services as well as general preventive health recommendations were provided to patient.     Varney Biles, LPN   68/12/1570    Agree with plan. She has an appointment with me 11/4 and we can discuss symptom then. Mable Paris, NP

## 2018-09-27 NOTE — Patient Instructions (Addendum)
  Ms. Krista Gentry , Thank you for taking time to come for your Medicare Wellness Visit. I appreciate your ongoing commitment to your health goals. Please review the following plan we discussed and let me know if I can assist you in the future.   Follow up as needed.    Bring a copy of your [place] and/or Living Will to be scanned into chart.  Have a great day!  These are the goals we discussed: Goals      Patient Stated   . DIET - INCREASE WATER INTAKE (pt-stated)    . Increase physical activity (pt-stated)       This is a list of the screening recommended for you and due dates:  Health Maintenance  Topic Date Due  . Colon Cancer Screening  11/11/1996  . Flu Shot  07/20/2018  . Mammogram  08/28/2020  . Tetanus Vaccine  09/07/2023  . DEXA scan (bone density measurement)  Completed  .  Hepatitis C: One time screening is recommended by Center for Disease Control  (CDC) for  adults born from [birth year] through 1965.   Completed  . Pneumonia vaccines  Completed

## 2018-10-23 ENCOUNTER — Encounter: Payer: Self-pay | Admitting: Family

## 2018-10-23 ENCOUNTER — Ambulatory Visit (INDEPENDENT_AMBULATORY_CARE_PROVIDER_SITE_OTHER): Payer: Medicare Other | Admitting: Family

## 2018-10-23 VITALS — BP 134/68 | HR 67 | Temp 98.0°F | Resp 16 | Ht 64.5 in | Wt 234.0 lb

## 2018-10-23 DIAGNOSIS — Z23 Encounter for immunization: Secondary | ICD-10-CM

## 2018-10-23 DIAGNOSIS — M8580 Other specified disorders of bone density and structure, unspecified site: Secondary | ICD-10-CM | POA: Diagnosis not present

## 2018-10-23 DIAGNOSIS — M2550 Pain in unspecified joint: Secondary | ICD-10-CM | POA: Diagnosis not present

## 2018-10-23 DIAGNOSIS — M1990 Unspecified osteoarthritis, unspecified site: Secondary | ICD-10-CM | POA: Diagnosis not present

## 2018-10-23 DIAGNOSIS — Z0000 Encounter for general adult medical examination without abnormal findings: Secondary | ICD-10-CM

## 2018-10-23 MED ORDER — MELOXICAM 7.5 MG PO TABS: 7.5000 mg | ORAL_TABLET | Freq: Every day | ORAL | 0 refills | Status: DC

## 2018-10-23 NOTE — Assessment & Plan Note (Addendum)
Presentation, exam supports right hip trochanteric bursitis.  Right third metacarpal numbness , I suspect carpal tunnel etiology.  Patient is most comfortable conservative management of both at this time.  Declines hip XR , orthopedic consult. Trial mobic.  Patient will let me know if no improvement.

## 2018-10-23 NOTE — Progress Notes (Signed)
Subjective:    Patient ID: Krista Gentry, female    DOB: Jul 28, 1946, 72 y.o.   MRN: [id number]  CC: Krista Gentry is a 72 y.o. female who presents today for physical exam.    HPI: Right hip pain 4 weeks, waxes and waned.  No pain today. Pain is bearable. Onset when watching marathon of show,  Thinks from sitting too much. Painful to sleep on right hip.' Achy' in right groin , extends to right ankle. No numbness. No h/o cancer.  Had been doing fast walking due to pain. No falls.  Using tens unit, ibuprofen with some relief. No low back, hip surgeries.   She has numbness in right 3rd, 4th finger tipsx 3-4 , unchanged. 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 Does quilting.   Osteopenia- declines medication therapy based on side effects.        Colorectal Cancer Screening: cologuard negative  Breast Cancer Screening: Mammogram UTD Cervical Cancer Screening: h/o hysterectomy.  Bone Health screening/DEXA for 65+: osteopenic, declines treatment. On calcium.  Lung Cancer Screening: Doesn't have 30 year pack year history and age > 46 years.       Tetanus - UTD        Pneumococcal - complete Labs: Screening labs today. Exercise: Gets regular exercise.  Alcohol use: occsasional Smoking/tobacco use: former smoker.  Skin: follows with dermatology.  Wears seat belt: Yes.  HISTORY:  Past Medical History:  Diagnosis Date  . Allergy     Past Surgical History:  Procedure Laterality Date  . ABDOMINAL HYSTERECTOMY    . BILATERAL OOPHORECTOMY    . BREAST BIOPSY    . CHOLECYSTECTOMY  1994   Family History  Problem Relation Age of Onset  . Kidney disease Mother        nephrolithiasis  . Dementia Mother   . Hypertension Mother   . Mental retardation Sister 8       possible autism,  now with depression  . Dementia Sister   . Cancer Maternal Aunt        breast cancer  . Dementia Maternal Aunt   . Mental retardation Maternal Aunt     . Dementia Maternal Uncle   . Cancer Maternal Grandmother        breast cancer  . Heart attack Paternal Grandmother   . Colon cancer Neg Hx       ALLERGIES: Patient has no known allergies.  Current Outpatient Medications on File Prior to Visit  Medication Sig Dispense Refill  . aspirin EC 81 MG tablet Take by mouth.    Marland Kitchen BIOTIN PO Take by mouth.    . Calcium-Vitamin D 600-200 MG-UNIT tablet Take by mouth.    Drusilla Kanner EXTRACT PO Take by mouth daily.     No current facility-administered medications on file prior to visit.     Social History   Tobacco Use  . Smoking status: Former Smoker    Packs/day: 1.00    Years: 5.00    Pack years: 5.00    Types: Cigarettes    Last attempt to quit: 04/14/1970    Years since quitting: 48.5  . Smokeless tobacco: Never Used  Substance Use Topics  . Alcohol use: Yes    Alcohol/week: 6.0 standard drinks    Types: 2 Glasses of wine, 1 Cans of beer, 3 Shots of liquor per week    Comment: occassion  . Drug use: No    Review of  Systems  Constitutional: Negative for chills, fever and unexpected weight change.  HENT: Negative for congestion.   Respiratory: Negative for cough.   Cardiovascular: Negative for chest pain, palpitations and leg swelling.  Gastrointestinal: Negative for nausea and vomiting.  Musculoskeletal: Positive for arthralgias. Negative for back pain and myalgias.  Skin: Negative for rash.  Neurological: Positive for numbness (right hand). Negative for headaches.  Hematological: Negative for adenopathy.  Psychiatric/Behavioral: Negative for confusion.      Objective:    BP 134/68 (BP Location: Left Arm, Patient Position: Sitting, Cuff Size: Large)   Pulse 67   Temp 98 F (36.7 C) (Oral)   Resp 16   Ht 5' 4.5" (1.638 m)   Wt 234 lb (106.1 kg)   SpO2 99%   BMI 39.55 kg/m   BP Readings from Last 3 Encounters:  10/23/18 134/68  09/27/18 118/64  09/29/17 126/78   Wt Readings from Last 3 Encounters:  10/23/18  234 lb (106.1 kg)  09/27/18 233 lb 12.8 oz (106.1 kg)  09/29/17 228 lb 3.2 oz (103.5 kg)    Physical Exam  Constitutional: She appears well-developed and well-nourished.  Eyes: Conjunctivae are normal.  Neck: No thyroid mass and no thyromegaly present.  Cardiovascular: Normal rate, regular rhythm, normal heart sounds and normal pulses.  Pulmonary/Chest: Effort normal and breath sounds normal. She has no wheezes. She has no rhonchi. She has no rales. Right breast exhibits no inverted nipple, no mass, no nipple discharge, no skin change and no tenderness. Left breast exhibits no inverted nipple, no mass, no nipple discharge, no skin change and no tenderness. Breasts are symmetrical.  CBE performed.   Musculoskeletal:       Right wrist: She exhibits normal range of motion and no tenderness.       Right hip: She exhibits tenderness. She exhibits normal range of motion and normal strength.       Right hand: She exhibits normal range of motion, normal capillary refill and no swelling. Normal sensation noted. Normal strength noted.       Hands: Right Hip: No limp or waddling gait. Full ROM with flexion and hip rotation in flexion.    No pain of lateral hip with  (flexion-abduction-external rotation) test.   Pain with deep palpation of greater trochanter.   Sensation intact 3rd right metacarpal.    Lymphadenopathy:       Head (right side): No submental, no submandibular, no tonsillar, no preauricular, no posterior auricular and no occipital adenopathy present.       Head (left side): No submental, no submandibular, no tonsillar, no preauricular, no posterior auricular and no occipital adenopathy present.    She has no cervical adenopathy.       Right cervical: No superficial cervical, no deep cervical and no posterior cervical adenopathy present.      Left cervical: No superficial cervical, no deep cervical and no posterior cervical adenopathy present.    She has no axillary adenopathy.    Neurological: She is alert.  Skin: Skin is warm and dry.  Psychiatric: She has a normal mood and affect. Her speech is normal and behavior is normal. Thought content normal.  Vitals reviewed.      Assessment & Plan:   Problem List Items Addressed This Visit      Musculoskeletal and Integument   Arthritis    Presentation, exam supports right hip trochanteric bursitis.  Right third metacarpal numbness , I suspect carpal tunnel etiology.  Patient is most comfortable conservative  management of both at this time.  Declines hip XR , orthopedic consult. Trial mobic.  Patient will let me know if no improvement.      Relevant Medications   aspirin EC 81 MG tablet   meloxicam (MOBIC) 7.5 MG tablet   Osteopenia    Discussed results of DEXA scan again today.  Patient politely declines medication therapy.  She will continue calcium at home. Will repeat dexa 2020        Other   Routine general medical examination at a health care facility    Clinical breast exam performed.  Deferred pelvic exam in the absence of complaints, history of hysterectomy.  Influenza vaccine given today.       Other Visit Diagnoses    Arthralgia, unspecified joint    -  Primary   Relevant Medications   meloxicam (MOBIC) 7.5 MG tablet       I am having Foy [place]. Darrow start on meloxicam. I am also having her maintain her BIOTIN PO, CRANBERRY EXTRACT PO, aspirin EC, and Calcium-Vitamin D.   Meds ordered this encounter  Medications  . meloxicam (MOBIC) 7.5 MG tablet    Sig: Take 1 tablet (7.5 mg total) by mouth daily. Take with food    Dispense:  90 tablet    Refill:  0    Order Specific Question:   Supervising Provider    Answer:   Crecencio Mc [[id number]]    Return precautions given.   Risks, benefits, and alternatives of the medications and treatment plan prescribed today were discussed, and patient expressed understanding.   Education regarding symptom management and diagnosis given to patient on  AVS.   Continue to follow with Burnard Hawthorne, FNP for routine health maintenance.   Johney Frame and I agreed with plan.   Mable Paris, FNP

## 2018-10-23 NOTE — Assessment & Plan Note (Signed)
Discussed results of DEXA scan again today.  Patient politely declines medication therapy.  She will continue calcium at home. Will repeat dexa 2020

## 2018-10-23 NOTE — Assessment & Plan Note (Signed)
Clinical breast exam performed.  Deferred pelvic exam in the absence of complaints, history of hysterectomy.  Influenza vaccine given today.

## 2018-10-23 NOTE — Addendum Note (Signed)
Addended by: Burnard Hawthorne on: 10/23/2018 03:36 PM   Modules accepted: Orders

## 2018-10-23 NOTE — Patient Instructions (Signed)
Trial meloxicam as discussed with food for arthritic pain.  If no improvement with medication, heat, exercise as discussed, we will consult orthopedics.  Please let me know if this something you would like to do. 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    Health Maintenance for Postmenopausal Women Menopause is a normal process in which your reproductive ability comes to an end. This process happens gradually over a span of months to years, usually between the ages of 72 and 72. Menopause is complete when you have missed 12 consecutive menstrual periods. It is important to talk with your health care provider about some of the most common conditions that affect postmenopausal women, such as heart disease, cancer, and bone loss (osteoporosis). Adopting a healthy lifestyle and getting preventive care can help to promote your health and wellness. Those actions can also lower your chances of developing some of these common conditions. What should I know about menopause? During menopause, you may experience a number of symptoms, such as:  Moderate-to-severe hot flashes.  Night sweats.  Decrease in sex drive.  Mood swings.  Headaches.  Tiredness.  Irritability.  Memory problems.  Insomnia.  Choosing to treat or not to treat menopausal changes is an individual decision that you make with your health care provider. What should I know about hormone replacement therapy and supplements? Hormone therapy products are effective for treating symptoms that are associated with menopause, such as hot flashes and night sweats. Hormone replacement carries certain risks, especially as you become older. If you are thinking about using estrogen or estrogen with progestin treatments, discuss the benefits and risks with your health care provider. What should I know about heart disease and stroke? Heart disease, heart attack, and stroke become more likely as you age. This may be due, in  part, to the hormonal changes that your body experiences during menopause. These can affect how your body processes dietary fats, triglycerides, and cholesterol. Heart attack and stroke are both medical emergencies. There are many things that you can do to help prevent heart disease and stroke:  Have your blood pressure checked at least every 1-2 years. High blood pressure causes heart disease and increases the risk of stroke.  If you are 72-5 years old, ask your health care provider if you should take aspirin to prevent a heart attack or a stroke.  Do not use any tobacco products, including cigarettes, chewing tobacco, or electronic cigarettes. If you need help quitting, ask your health care provider.  It is important to eat a healthy diet and maintain a healthy weight. ? Be sure to include plenty of vegetables, fruits, low-fat dairy products, and lean protein. ? Avoid eating foods that are high in solid fats, added sugars, or salt (sodium).  Get regular exercise. This is one of the most important things that you can do for your health. ? Try to exercise for at least 150 minutes each week. The type of exercise that you do should increase your heart rate and make you sweat. This is known as moderate-intensity exercise. ? Try to do strengthening exercises at least twice each week. Do these in addition to the moderate-intensity exercise.  Know your numbers.Ask your health care provider to check your cholesterol and your blood glucose. Continue to have your blood tested as directed by your health care provider.  What should I know about cancer screening? There are several types of cancer. Take the following steps to reduce your risk and to catch any cancer development as  early as possible. Breast Cancer  Practice breast self-awareness. ? This means understanding how your breasts normally appear and feel. ? It also means doing regular breast self-exams. Let your health care provider know about  any changes, no matter how small.  If you are 72 or older, have a clinician do a breast exam (clinical breast exam or CBE) every year. Depending on your age, family history, and medical history, it may be recommended that you also have a yearly breast X-ray (mammogram).  If you have a family history of breast cancer, talk with your health care provider about genetic screening.  If you are at high risk for breast cancer, talk with your health care provider about having an MRI and a mammogram every year.  Breast cancer (BRCA) gene test is recommended for women who have family members with BRCA-related cancers. Results of the assessment will determine the need for genetic counseling and BRCA1 and for BRCA2 testing. BRCA-related cancers include these types: ? Breast. This occurs in males or females. ? Ovarian. ? Tubal. This may also be called fallopian tube cancer. ? Cancer of the abdominal or pelvic lining (peritoneal cancer). ? Prostate. ? Pancreatic.  Cervical, Uterine, and Ovarian Cancer Your health care provider may recommend that you be screened regularly for cancer of the pelvic organs. These include your ovaries, uterus, and vagina. This screening involves a pelvic exam, which includes checking for microscopic changes to the surface of your cervix (Pap test).  For women ages 21-65, health care providers may recommend a pelvic exam and a Pap test every three years. For women ages 72-65, they may recommend the Pap test and pelvic exam, combined with testing for human papilloma virus (HPV), every five years. Some types of HPV increase your risk of cervical cancer. Testing for HPV may also be done on women of any age who have unclear Pap test results.  Other health care providers may not recommend any screening for nonpregnant women who are considered low risk for pelvic cancer and have no symptoms. Ask your health care provider if a screening pelvic exam is right for you.  If you have had  past treatment for cervical cancer or a condition that could lead to cancer, you need Pap tests and screening for cancer for at least 20 years after your treatment. If Pap tests have been discontinued for you, your risk factors (such as having a new sexual partner) need to be reassessed to determine if you should start having screenings again. Some women have medical problems that increase the chance of getting cervical cancer. In these cases, your health care provider may recommend that you have screening and Pap tests more often.  If you have a family history of uterine cancer or ovarian cancer, talk with your health care provider about genetic screening.  If you have vaginal bleeding after reaching menopause, tell your health care provider.  There are currently no reliable tests available to screen for ovarian cancer.  Lung Cancer Lung cancer screening is recommended for adults 59-2 years old who are at high risk for lung cancer because of a history of smoking. A yearly low-dose CT scan of the lungs is recommended if you:  Currently smoke.  Have a history of at least 30 pack-years of smoking and you currently smoke or have quit within the past 15 years. A pack-year is smoking an average of one pack of cigarettes per day for one year.  Yearly screening should:  Continue until it has been  15 years since you quit.  Stop if you develop a health problem that would prevent you from having lung cancer treatment.  Colorectal Cancer  This type of cancer can be detected and can often be prevented.  Routine colorectal cancer screening usually begins at age 7 and continues through age 25.  If you have risk factors for colon cancer, your health care provider may recommend that you be screened at an earlier age.  If you have a family history of colorectal cancer, talk with your health care provider about genetic screening.  Your health care provider may also recommend using home test kits to  check for hidden blood in your stool.  A small camera at the end of a tube can be used to examine your colon directly (sigmoidoscopy or colonoscopy). This is done to check for the earliest forms of colorectal cancer.  Direct examination of the colon should be repeated every 5-10 years until age 9. However, if early forms of precancerous polyps or small growths are found or if you have a family history or genetic risk for colorectal cancer, you may need to be screened more often.  Skin Cancer  Check your skin from head to toe regularly.  Monitor any moles. Be sure to tell your health care provider: ? About any new moles or changes in moles, especially if there is a change in a mole's shape or color. ? If you have a mole that is larger than the size of a pencil eraser.  If any of your family members has a history of skin cancer, especially at a young age, talk with your health care provider about genetic screening.  Always use sunscreen. Apply sunscreen liberally and repeatedly throughout the day.  Whenever you are outside, protect yourself by wearing long sleeves, pants, a wide-brimmed hat, and sunglasses.  What should I know about osteoporosis? Osteoporosis is a condition in which bone destruction happens more quickly than new bone creation. After menopause, you may be at an increased risk for osteoporosis. To help prevent osteoporosis or the bone fractures that can happen because of osteoporosis, the following is recommended:  If you are 73-48 years old, get at least 1,000 mg of calcium and at least 600 mg of vitamin D per day.  If you are older than age 78 but younger than age 56, get at least 1,200 mg of calcium and at least 600 mg of vitamin D per day.  If you are older than age 71, get at least 1,200 mg of calcium and at least 800 mg of vitamin D per day.  Smoking and excessive alcohol intake increase the risk of osteoporosis. Eat foods that are rich in calcium and vitamin D, and  do weight-bearing exercises several times each week as directed by your health care provider. What should I know about how menopause affects my mental health? Depression may occur at any age, but it is more common as you become older. Common symptoms of depression include:  Low or sad mood.  Changes in sleep patterns.  Changes in appetite or eating patterns.  Feeling an overall lack of motivation or enjoyment of activities that you previously enjoyed.  Frequent crying spells.  Talk with your health care provider if you think that you are experiencing depression. What should I know about immunizations? It is important that you get and maintain your immunizations. These include:  Tetanus, diphtheria, and pertussis (Tdap) booster vaccine.  Influenza every year before the flu season begins.  Pneumonia  vaccine.  Shingles vaccine.  Your health care provider may also recommend other immunizations. This information is not intended to replace advice given to you by your health care provider. Make sure you discuss any questions you have with your health care provider. Document Released: 01/28/2006 Document Revised: 06/25/2016 Document Reviewed: 09/09/2015 Elsevier Interactive Patient Education  2018 [employer].

## 2018-10-24 LAB — LIPID PANEL
Cholesterol: 196 mg/dL (ref 0–200)
HDL: 83.5 mg/dL (ref >39.00)
LDL CALC: 89 mg/dL (ref 0–99)
NonHDL: 112.44
TRIGLYCERIDES: 117 mg/dL (ref 0.0–149.0)
Total CHOL/HDL Ratio: 2
VLDL: 23.4 mg/dL (ref 0.0–40.0)

## 2018-10-24 LAB — CBC WITH DIFFERENTIAL/PLATELET
Basophils Absolute: 0.1 10*3/uL (ref 0.0–0.1)
Basophils Relative: 1.2 % (ref 0.0–3.0)
EOS ABS: 0.2 10*3/uL (ref 0.0–0.7)
EOS PCT: 2.2 % (ref 0.0–5.0)
HCT: 41.4 % (ref 36.0–46.0)
Hemoglobin: 13.5 g/dL (ref 12.0–15.0)
Lymphocytes Relative: 24.9 % (ref 12.0–46.0)
Lymphs Abs: 1.8 10*3/uL (ref 0.7–4.0)
MCHC: 32.6 g/dL (ref 30.0–36.0)
MCV: 85.1 fl (ref 78.0–100.0)
MONO ABS: 0.5 10*3/uL (ref 0.1–1.0)
Monocytes Relative: 6.6 % (ref 3.0–12.0)
NEUTROS ABS: 4.8 10*3/uL (ref 1.4–7.7)
Neutrophils Relative %: 65.1 % (ref 43.0–77.0)
PLATELETS: 214 10*3/uL (ref 150.0–400.0)
RBC: 4.86 Mil/uL (ref 3.87–5.11)
RDW: 15.3 % (ref 11.5–15.5)
WBC: 7.4 10*3/uL (ref 4.0–10.5)

## 2018-10-24 LAB — COMPREHENSIVE METABOLIC PANEL
ALT: 13 U/L (ref 0–35)
AST: 16 U/L (ref 0–37)
Albumin: 4.3 g/dL (ref 3.5–5.2)
Alkaline Phosphatase: 56 U/L (ref 39–117)
BUN: 17 mg/dL (ref 6–23)
CHLORIDE: 104 meq/L (ref 96–112)
CO2: 28 meq/L (ref 19–32)
Calcium: 9.6 mg/dL (ref 8.4–10.5)
Creatinine, Ser: 0.98 mg/dL (ref 0.40–1.20)
GFR: 59.3 mL/min — ABNORMAL LOW (ref >60.00)
Glucose, Bld: 91 mg/dL (ref 70–99)
POTASSIUM: 4.7 meq/L (ref 3.5–5.1)
Sodium: 141 mEq/L (ref 135–145)
Total Bilirubin: 0.4 mg/dL (ref 0.2–1.2)
Total Protein: 7.4 g/dL (ref 6.0–8.3)

## 2018-10-24 LAB — TSH: TSH: 3.34 u[IU]/mL (ref 0.35–4.50)

## 2018-10-24 LAB — VITAMIN D 25 HYDROXY (VIT D DEFICIENCY, FRACTURES): VITD: 27.1 ng/mL — ABNORMAL LOW (ref 30.00–100.00)

## 2018-10-24 LAB — HEMOGLOBIN A1C: Hgb A1c MFr Bld: 5.7 % (ref 4.6–6.5)

## 2018-10-25 ENCOUNTER — Encounter: Payer: Self-pay | Admitting: Family

## 2018-11-20 ENCOUNTER — Ambulatory Visit: Payer: Self-pay | Admitting: *Deleted

## 2018-11-20 NOTE — Telephone Encounter (Signed)
Pt called with complaints of having trouble sleeping because of  Cough and she is having problems not breathing well, sinus congestion, lots of yellow mucus; these symptoms started 11/16/18; nurse triage initiated and recommendations made per protocol; the pt normally sees Mable Paris, [place]; per [place] there is no availability in that office today; per Mearl Latin at [facility], there is no availability at that location today; pt offered and accepted appointment with Philis Nettle, [facility],11/21/18 at 0800; will route to office for notification.   Reason for Disposition . [1] Continuous (nonstop) coughing interferes with work or school AND [2] no improvement using cough treatment per Care Advice  Answer Assessment - Initial Assessment Questions 1. RESPIRATORY STATUS: "Describe your breathing?" (e.g., wheezing, shortness of breath, unable to speak, severe coughing)      Short of breath, seer cough 2. ONSET: "When did this breathing problem begin?"      11/16/18 3. PATTERN "Does the difficult breathing come and go, or has it been constant since it started?"      intermittent 4. SEVERITY: "How bad is your breathing?" (e.g., mild, moderate, severe)    - MILD: No SOB at rest, mild SOB with walking, speaks normally in sentences, can lay down, no retractions, pulse < 100.    - MODERATE: SOB at rest, SOB with minimal exertion and prefers to sit, cannot lie down flat, speaks in phrases, mild retractions, audible wheezing, pulse 100-120.    - SEVERE: Very SOB at rest, speaks in single words, struggling to breathe, sitting hunched forward, retractions, pulse > 120      mild 5. RECURRENT SYMPTOM: "Have you had difficulty breathing before?" If so, ask: "When was the last time?" and "What happened that time?"      Yes, pt previously diagnosed with bronchitis   6. CARDIAC HISTORY: "Do you have any history of heart disease?" (e.g., heart attack, angina, bypass surgery, angioplasty)      no 7. LUNG  HISTORY: "Do you have any history of lung disease?"  (e.g., pulmonary embolus, asthma, emphysema)     bronchitis 8. CAUSE: "What do you think is causing the breathing problem?"     Same as before  9. OTHER SYMPTOMS: "Do you have any other symptoms? (e.g., dizziness, runny nose, cough, chest pain, fever)     Productive cough, runny nose 10. PREGNANCY: "Is there any chance you are pregnant?" "When was your last menstrual period?"       n/a 11. TRAVEL: "Have you traveled out of the country in the last month?" (e.g., travel history, exposures)       no  Protocols used: COUGH - ACUTE PRODUCTIVE-A-AH, BREATHING DIFFICULTY-A-AH

## 2018-11-20 NOTE — Telephone Encounter (Signed)
Appt 11/21/18

## 2018-11-20 NOTE — Telephone Encounter (Signed)
FYI

## 2018-11-21 ENCOUNTER — Ambulatory Visit: Payer: Medicare Other | Admitting: Family Medicine

## 2018-11-21 ENCOUNTER — Encounter: Payer: Self-pay | Admitting: Family Medicine

## 2018-11-21 VITALS — BP 140/80 | HR 79 | Temp 98.6°F | Ht 64.0 in | Wt 201.0 lb

## 2018-11-21 DIAGNOSIS — J019 Acute sinusitis, unspecified: Secondary | ICD-10-CM | POA: Diagnosis not present

## 2018-11-21 DIAGNOSIS — R05 Cough: Secondary | ICD-10-CM

## 2018-11-21 DIAGNOSIS — R0982 Postnasal drip: Secondary | ICD-10-CM

## 2018-11-21 DIAGNOSIS — R059 Cough, unspecified: Secondary | ICD-10-CM

## 2018-11-21 MED ORDER — AMOXICILLIN-POT CLAVULANATE 875-125 MG PO TABS: 1.0000 | ORAL_TABLET | Freq: Two times a day (BID) | ORAL | 0 refills | Status: DC

## 2018-11-21 MED ORDER — BENZONATATE 100 MG PO CAPS: 100.0000 mg | ORAL_CAPSULE | Freq: Three times a day (TID) | ORAL | 0 refills | Status: DC | PRN

## 2018-11-21 NOTE — Progress Notes (Signed)
Subjective:    Patient ID: Krista Gentry, female    DOB: 1946-02-19, 72 y.o.   MRN: [id number]  HPI  Presents to clinic c/o sinus congestion, sinus pressure, sinus drainage for 10 days. Patient has been taking zyrtec the past few days without much relief in symptoms. Patient states especially at night she can feel the drainage running down her throat and it makes her cough.   No fever or chills. No nausea or vomiting.    Patient Active Problem List   Diagnosis Date Noted  . Epistaxis 09/29/2017  . Diarrhea 07/26/2017  . Lipoma of torso 07/26/2017  . Abnormal mammogram of right breast 10/09/2014  . Osteopenia 09/18/2013  . Vasovagal syncope 09/18/2013  . Routine general medical examination at a health care facility 05/22/2012  . Obesity, Class III, BMI 40-49.9 (morbid obesity) ([place]) 04/16/2012  . Arthritis 04/14/2012  . Screening for osteoporosis 04/14/2012  . Allergy    Social History   Tobacco Use  . Smoking status: Former Smoker    Packs/day: 1.00    Years: 5.00    Pack years: 5.00    Types: Cigarettes    Last attempt to quit: 04/14/1970    Years since quitting: 48.6  . Smokeless tobacco: Never Used  Substance Use Topics  . Alcohol use: Yes    Alcohol/week: 6.0 standard drinks    Types: 2 Glasses of wine, 1 Cans of beer, 3 Shots of liquor per week    Comment: occassion    Review of Systems   Constitutional: Negative for chills, fatigue and fever.  HENT: +congestion,  Sinus pressure, Sinus drainage Eyes: Negative.   Respiratory: +cough. No SOB or wheezes.  Cardiovascular: Negative for chest pain, palpitations and leg swelling.  Gastrointestinal: Negative for abdominal pain, diarrhea, nausea and vomiting.  Genitourinary: Negative for dysuria, frequency and urgency.  Musculoskeletal: Negative for arthralgias and myalgias.  Skin: Negative for color change, pallor and rash.  Neurological: Negative for syncope, light-headedness and headaches.    Psychiatric/Behavioral: The patient is not nervous/anxious.       Objective:   Physical Exam  Constitutional: She is oriented to person, place, and time. She appears well-nourished. No distress.  HENT:  Head: Normocephalic and atraumatic.  Nose: Mucosal edema and rhinorrhea present. Right sinus exhibits maxillary sinus tenderness and frontal sinus tenderness. Left sinus exhibits maxillary sinus tenderness and frontal sinus tenderness.  +thick yellow nasal discharge and post nasal drainage.   Eyes: Conjunctivae and EOM are normal. No scleral icterus.  Neck: Neck supple. No tracheal deviation present.  Cardiovascular: Normal rate and regular rhythm.  Pulmonary/Chest: Effort normal and breath sounds normal. No respiratory distress.  Musculoskeletal: Normal range of motion. She exhibits no edema.  Neurological: She is alert and oriented to person, place, and time.  Skin: Skin is warm and dry.  Psychiatric: She has a normal mood and affect. Her behavior is normal.  Nursing note and vitals reviewed.     Vitals:   11/21/18 0810  BP: 140/80  Pulse: 79  Temp: 98.6 F (37 C)  SpO2: 91%   Assessment & Plan:   Acute sinusitis - patient will take Augmentin twice daily for 10 days.  Advised to continue taking Zyrtec as this will help dry up some congestion, also advised to increase fluids, do good handwashing get plenty of rest.  Also suggested she try doing saline nasal washes to help wash out some congestion.  Cough/postnasal drip - lungs are clear on exam so I suspect  cough is related to postnasal drip.  Zyrtec will help dry up postnasal drip as well.  Patient given Ladona Ridgel to use as needed to help calm cough.  Keep regularly scheduled follow-up with PCP as planned.  Return to clinic sooner if any issues arise or if current symptoms persist or worsen.

## 2018-11-21 NOTE — Patient Instructions (Signed)

## 2019-01-16 DIAGNOSIS — Z0120 Encounter for dental examination and cleaning without abnormal findings: Secondary | ICD-10-CM | POA: Diagnosis not present

## 2019-03-07 ENCOUNTER — Encounter: Payer: Self-pay | Admitting: Family Medicine

## 2019-04-07 IMAGING — MG MM DIGITAL SCREENING BILAT W/ TOMO W/ CAD
8 of 12 series · 8 of 28 positions shown · non-contrast
Comparison: Previous exam(s).

CLINICAL DATA: Screening.

EXAM:
2D DIGITAL SCREENING BILATERAL MAMMOGRAM WITH CAD AND ADJUNCT TOMO

[R CC]
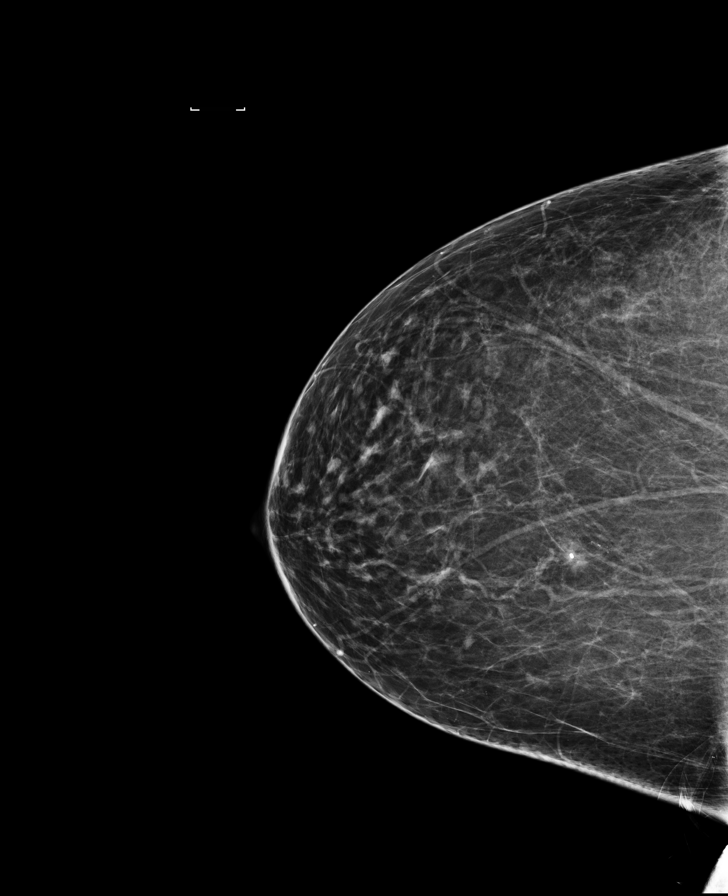

[L MLO synth-2D]
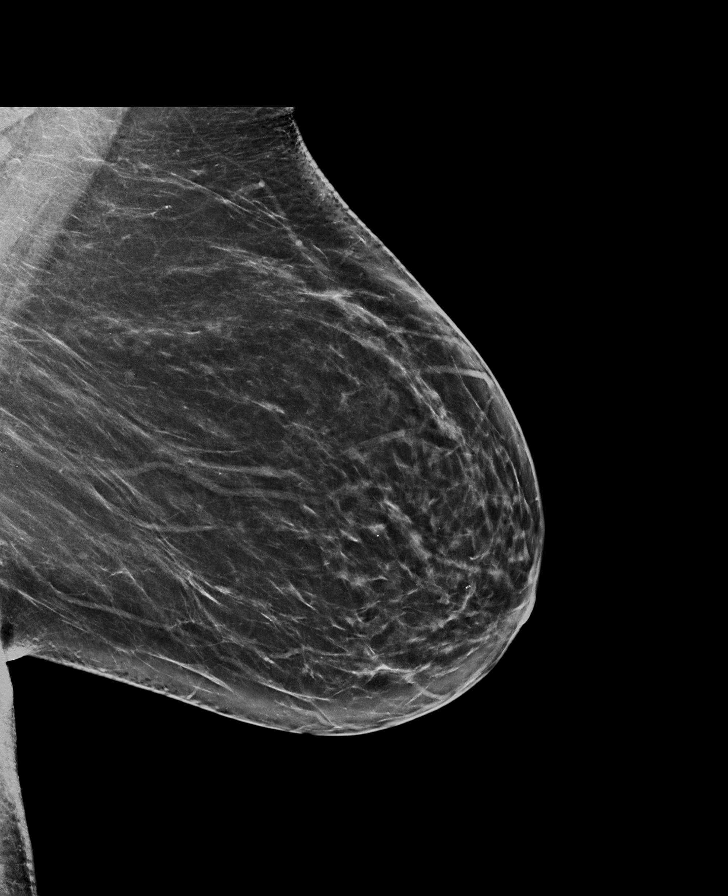

[R MLO synth-2D]
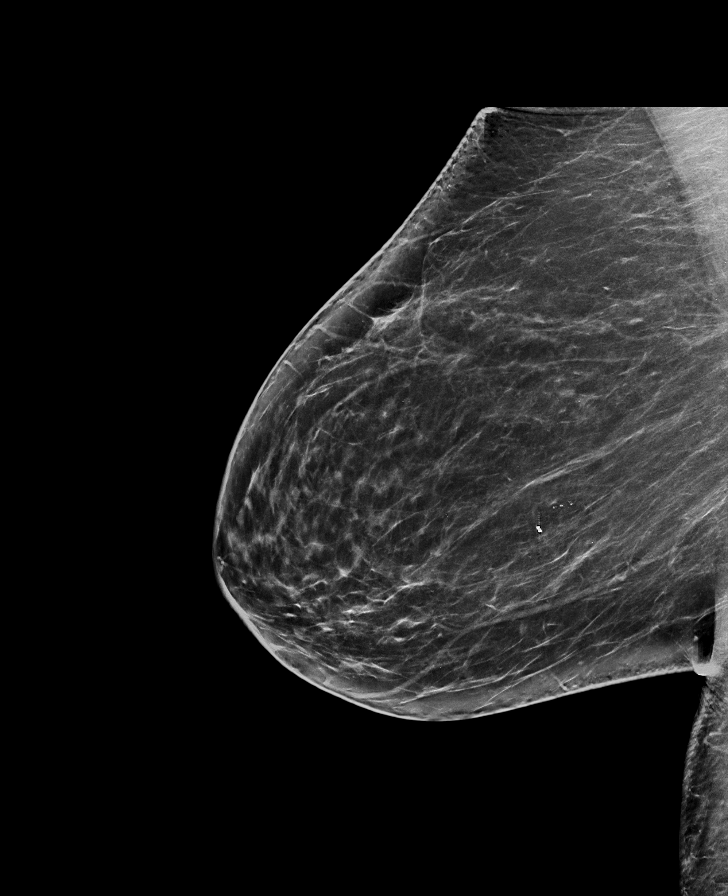

[L CC synth-2D]
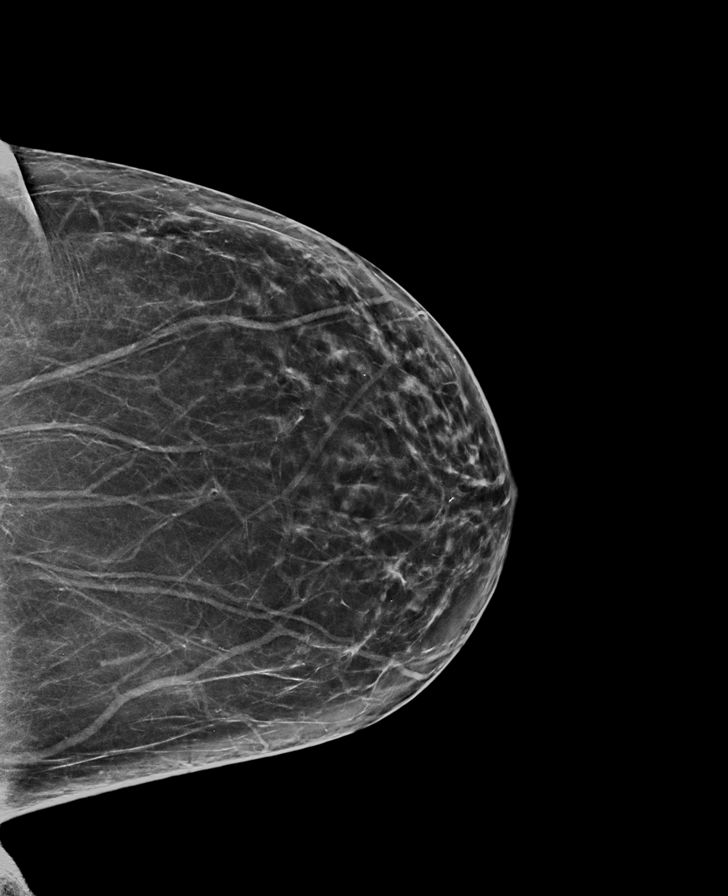

[L CC]
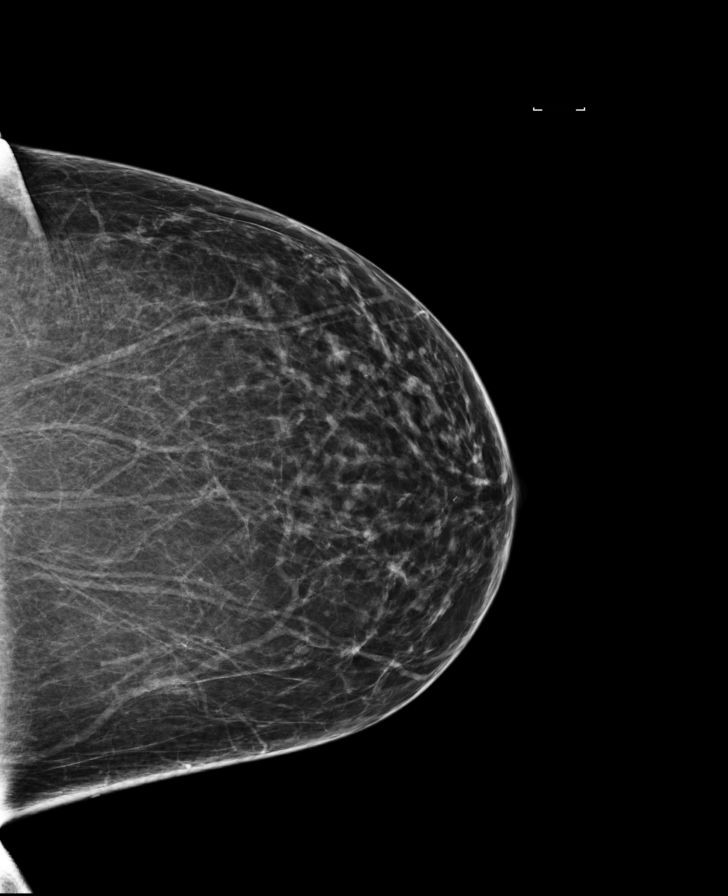

[R CC synth-2D]
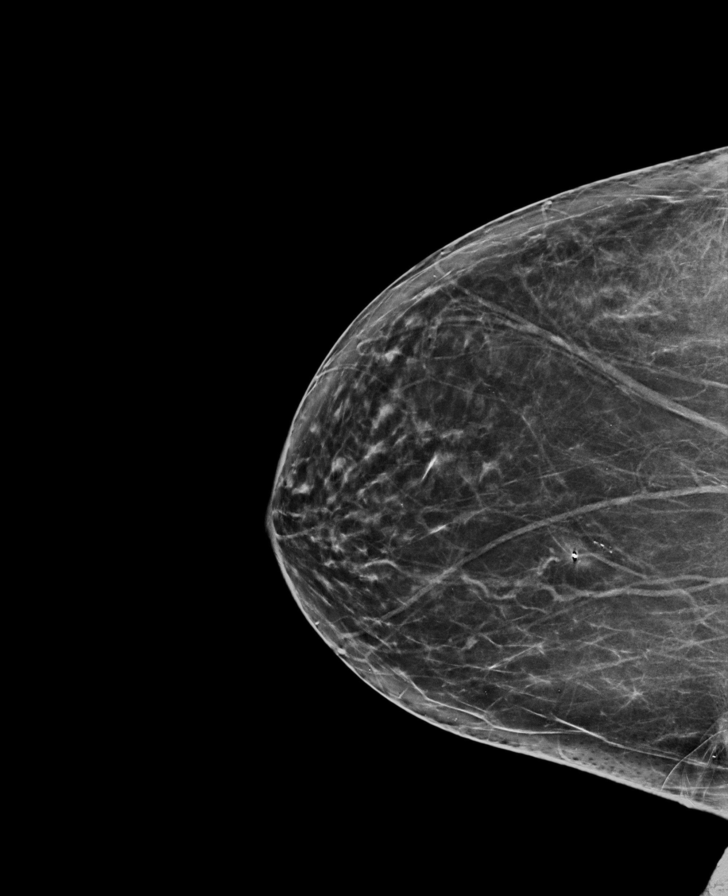

[R MLO]
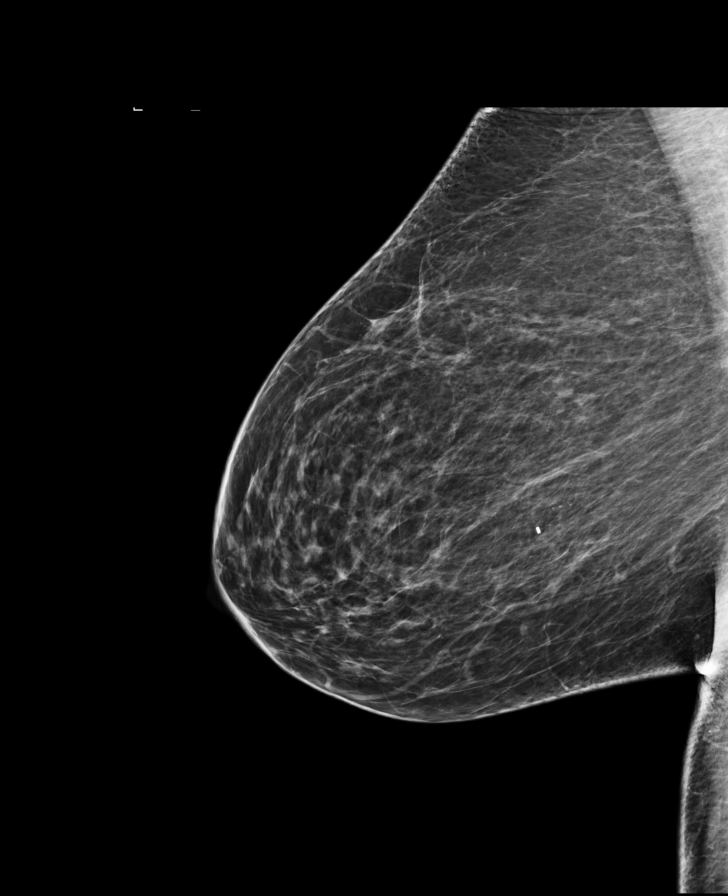

[L MLO]
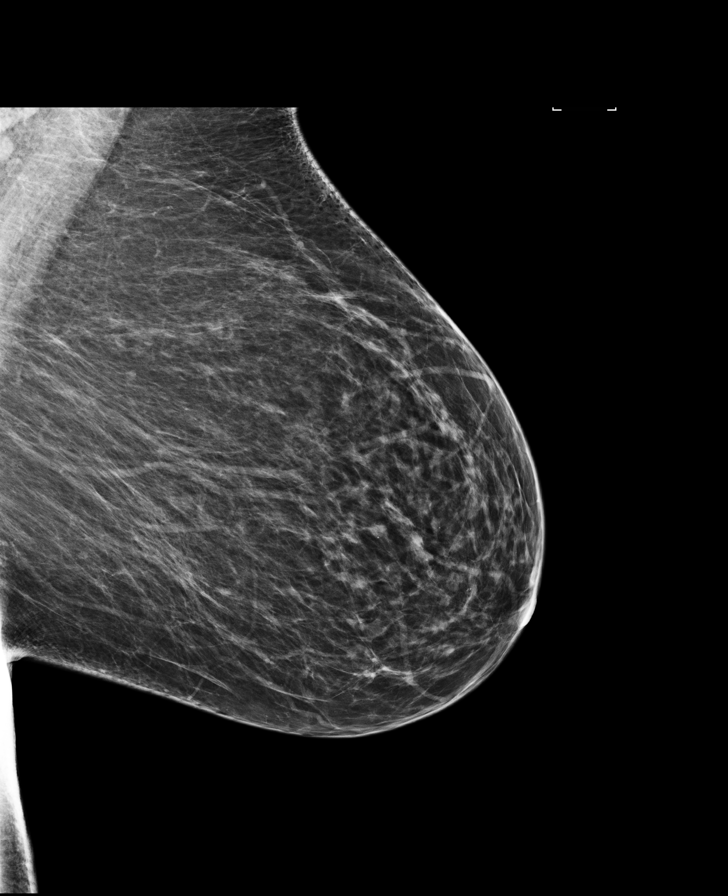

[8 of 28 positions shown; findings below may reference images not displayed]

ACR Breast Density Category b: There are scattered areas of
fibroglandular density.
FINDINGS: There are no findings suspicious for malignancy. Images were
processed with CAD.
IMPRESSION: No mammographic evidence of malignancy. A result letter of this
screening mammogram will be mailed directly to the patient.

RECOMMENDATION:
Screening mammogram in one year. (Code:97-6-RS4)

BI-RADS CATEGORY  1: Negative.

## 2019-07-17 ENCOUNTER — Encounter: Payer: Self-pay | Admitting: Family

## 2019-09-25 DIAGNOSIS — Z0120 Encounter for dental examination and cleaning without abnormal findings: Secondary | ICD-10-CM | POA: Diagnosis not present

## 2019-09-26 ENCOUNTER — Other Ambulatory Visit: Payer: Self-pay | Admitting: Family

## 2019-09-26 DIAGNOSIS — Z1231 Encounter for screening mammogram for malignant neoplasm of breast: Secondary | ICD-10-CM

## 2019-10-03 ENCOUNTER — Other Ambulatory Visit: Payer: Self-pay

## 2019-10-03 ENCOUNTER — Ambulatory Visit: Payer: Medicare Other | Admitting: Family

## 2019-10-03 ENCOUNTER — Ambulatory Visit (INDEPENDENT_AMBULATORY_CARE_PROVIDER_SITE_OTHER): Payer: Medicare Other

## 2019-10-03 DIAGNOSIS — Z0000 Encounter for general adult medical examination without abnormal findings: Secondary | ICD-10-CM | POA: Diagnosis not present

## 2019-10-03 NOTE — Progress Notes (Addendum)
Subjective:   Krista Gentry is a 73 y.o. female who presents for Medicare Annual (Subsequent) preventive examination.  Review of Systems:  No ROS.  Medicare Wellness Virtual Visit.  Visual/audio telehealth visit, UTA vital signs.   See social history for additional risk factors.   Cardiac Risk Factors include: advanced age (>30men, >82 women)     Objective:     Vitals: There were no vitals taken for this visit.  There is no height or weight on file to calculate BMI.  Advanced Directives 10/03/2019 09/27/2018  Does Patient Have a Medical Advance Directive? Yes Yes  Type of Paramedic of [place];Living will [place]  Does patient want to make changes to medical advance directive? No - Patient declined No - Patient declined  Copy of [place] in Chart? No - copy requested No - copy requested    Tobacco Social History   Tobacco Use  Smoking Status Former Smoker  . Packs/day: 1.00  . Years: 5.00  . Pack years: 5.00  . Types: Cigarettes  . Quit date: 04/14/1970  . Years since quitting: 49.5  Smokeless Tobacco Never Used     Counseling given: Not Answered   Clinical Intake:  Pre-visit preparation completed: Yes        Diabetes: No  How often do you need to have someone help you when you read instructions, pamphlets, or other written materials from your doctor or pharmacy?: 1 - Never  Interpreter Needed?: No     Past Medical History:  Diagnosis Date  . Allergy    Past Surgical History:  Procedure Laterality Date  . ABDOMINAL HYSTERECTOMY    . BILATERAL OOPHORECTOMY    . BREAST BIOPSY    . CHOLECYSTECTOMY  1994   Family History  Problem Relation Age of Onset  . Kidney disease Mother        nephrolithiasis  . Dementia Mother   . Hypertension Mother   . Mental retardation Sister 8       possible autism,  now with depression  . Dementia Sister   . Cancer Maternal Aunt        breast  cancer  . Dementia Maternal Aunt   . Mental retardation Maternal Aunt   . Dementia Maternal Uncle   . Cancer Maternal Grandmother        breast cancer  . Heart attack Paternal Grandmother   . Colon cancer Neg Hx    Social History   Socioeconomic History  . Marital status: Married    Spouse name: Not on file  . Number of children: Not on file  . Years of education: Not on file  . Highest education level: Not on file  Occupational History  . Not on file  Social Needs  . Financial resource strain: Not hard at all  . Food insecurity    Worry: Never true    Inability: Never true  . Transportation needs    Medical: No    Non-medical: No  Tobacco Use  . Smoking status: Former Smoker    Packs/day: 1.00    Years: 5.00    Pack years: 5.00    Types: Cigarettes    Quit date: 04/14/1970    Years since quitting: 49.5  . Smokeless tobacco: Never Used  Substance and Sexual Activity  . Alcohol use: Yes    Alcohol/week: 6.0 standard drinks    Types: 2 Glasses of wine, 1 Cans of beer, 3 Shots of  liquor per week    Comment: occassion  . Drug use: No  . Sexual activity: Not on file  Lifestyle  . Physical activity    Days per week: 7 days    Minutes per session: 30 min  . Stress: Not at all  Relationships  . Social Herbalist on phone: Not on file    Gets together: Not on file    Attends religious service: Not on file    Active member of club or organization: Not on file    Attends meetings of clubs or organizations: Not on file    Relationship status: Not on file  Other Topics Concern  . Not on file  Social History Narrative  . Not on file    Outpatient Encounter Medications as of 10/03/2019  Medication Sig  . aspirin EC 81 MG tablet Take by mouth.  Marland Kitchen BIOTIN PO Take by mouth.  . Calcium-Vitamin D 600-200 MG-UNIT tablet Take by mouth.  Drusilla Kanner EXTRACT PO Take by mouth daily.  . meloxicam (MOBIC) 7.5 MG tablet Take 1 tablet (7.5 mg total) by mouth daily. Take  with food  . [DISCONTINUED] amoxicillin-clavulanate (AUGMENTIN) 875-125 MG tablet Take 1 tablet by mouth 2 (two) times daily.  . [DISCONTINUED] benzonatate (TESSALON) 100 MG capsule Take 1 capsule (100 mg total) by mouth 3 (three) times daily as needed for cough.   No facility-administered encounter medications on file as of 10/03/2019.     Activities of Daily Living In your present state of health, do you have any difficulty performing the following activities: 10/03/2019  Hearing? N  Vision? N  Difficulty concentrating or making decisions? N  Walking or climbing stairs? N  Dressing or bathing? N  Doing errands, shopping? N  Preparing Food and eating ? N  Using the Toilet? N  In the past six months, have you accidently leaked urine? N  Do you have problems with loss of bowel control? N  Managing your Medications? N  Managing your Finances? N  Housekeeping or managing your Housekeeping? N  Some recent data might be hidden    Patient Care Team: Burnard Hawthorne, FNP as PCP - General (Family Medicine)    Assessment:   This is a routine wellness examination for Krista Gentry.  I connected with patient 10/03/19 at  8:30 AM EDT by an audio enabled telemedicine application and verified that I am speaking with the correct person using two identifiers. Patient stated full name and DOB. Patient gave permission to continue with virtual visit. Patient's location was at home and Nurse's location was at [name] office.   Health Maintenance Due: -Influenza vaccine 2020- discussed; to be completed in season with doctor or local pharmacy.   -Mammogram- scheduled 10/11/19 Update all pending maintenance due as appropriate.   See completed HM at the end of note.   Eye: Visual acuity not assessed. Virtual visit. Wears corrective lenses. Followed by their ophthalmologist.   Dental: Visits every 6 months.    Hearing: Demonstrates normal hearing during visit.  Safety:  Patient feels safe at  home- yes Patient does have smoke detectors at home- yes Patient does wear sunscreen or protective clothing when in direct sunlight - yes Patient does wear seat belt when in a moving vehicle - yes Patient drives- yes Adequate lighting in walkways free from debris- yes Grab bars and handrails used as appropriate- yes Ambulates with no assistive device Cell phone on person when ambulating outside of the home- yes  Social: Alcohol intake -  yes      Smoking history- former    Smokers in home? none Illicit drug use? none  Depression: PHQ 2 &9 complete. See screening below. Denies irritability, anhedonia, sadness/tearfullness.    Falls: See screening below.    Medication: Taking as directed and without issues.   Covid-19: Precautions and sickness symptoms discussed. Wears mask, social distancing, hand hygiene as appropriate.   Activities of Daily Living Patient denies needing assistance with: household chores, feeding themselves, getting from bed to chair, getting to the toilet, bathing/showering, dressing, managing money, or preparing meals.   Memory: Patient is alert. Patient denies difficulty focusing or concentrating. Correctly identified the president of the [place], season and recall. Patient likes to read, play computer games, and quilt for brain stimulation.  BMI- discussed the importance of a healthy diet, water intake and the benefits of aerobic exercise.  Educational material provided.  Physical activity- pilates, stationary bike, no routine  Diet: Healthy Water: 16 ounces Caffeine: 1 cup coffee  Other Providers Patient Care Team: Burnard Hawthorne, FNP as PCP - General (Family Medicine)  Exercise Activities and Dietary recommendations Current Exercise Habits: Home exercise routine, Intensity: Mild  Goals      Patient Stated   . DIET - INCREASE WATER INTAKE (pt-stated)    . Increase physical activity (pt-stated)     I want to use the elliptical and ride my  stationary bike for 30 minutes.        Fall Risk Fall Risk  10/03/2019 09/27/2018 09/29/2017 07/26/2017 12/15/2016  Falls in the past year? 0 No No No No  Number falls in past yr: 0 - - - -  Injury with Fall? - - - - -  Comment - - - - -  Risk Factor Category  - - - - -  Risk for fall due to : - - - - -  Risk for fall due to: Comment - - - - -   Timed Get Up and Go performed: no, virtual visit  Depression Screen PHQ 2/9 Scores 10/03/2019 09/27/2018 09/29/2017 07/26/2017  PHQ - 2 Score 0 0 0 0     Cognitive Function MMSE - Mini Mental State Exam 09/27/2018  Orientation to time 5  Orientation to Place 5  Registration 3  Attention/ Calculation 5  Recall 3  Language- name 2 objects 2  Language- repeat 1  Language- follow 3 step command 3  Language- read & follow direction 1  Write a sentence 1  Copy design 1  Total score 30     6CIT Screen 10/03/2019  What Year? 0 points  What month? 0 points  What time? 0 points  Count back from 20 0 points  Months in reverse 0 points  Repeat phrase 0 points  Total Score 0    Immunization History  Administered Date(s) Administered  . Influenza Split 10/05/2014  . Influenza, High Dose Seasonal PF 10/23/2018  . Influenza-Unspecified 09/06/2013  . PPD Test 08/15/2013  . Pneumococcal Conjugate-13 08/03/2017  . Pneumococcal Polysaccharide-23 08/15/2013  . Tdap 09/06/2013   Screening Tests Health Maintenance  Topic Date Due  . INFLUENZA VACCINE  03/19/2020 (Originally 07/21/2019)  . MAMMOGRAM  08/28/2020  . Fecal DNA (Cologuard)  09/27/2020  . TETANUS/TDAP  09/07/2023  . DEXA SCAN  Completed  . Hepatitis C Screening  Completed  . PNA vac Low Risk Adult  Completed      Plan:   Keep all routine maintenance appointments.  Follow up 12/10/19 @ 8:00am  Medicare Attestation I have personally reviewed: The patient's medical and social history Their use of alcohol, tobacco or illicit drugs Their current medications and  supplements The patient's functional ability including ADLs,fall risks, home safety risks, cognitive, and hearing and visual impairment Diet and physical activities Evidence for depression   In addition, I have reviewed and discussed with patient certain preventive protocols, quality metrics, and best practice recommendations. A written personalized care plan for preventive services as well as general preventive health recommendations were provided to patient via mail.     OBrien-Blaney, Saahir Prude L, LPN  [id number]   I have reviewed the above information and agree with above.   Deborra Medina, MD

## 2019-10-03 NOTE — Patient Instructions (Addendum)
  Ms. Jimison , Thank you for taking time to come for your Medicare Wellness Visit. I appreciate your ongoing commitment to your health goals. Please review the following plan we discussed and let me know if I can assist you in the future.   These are the goals we discussed: Goals      Patient Stated   . DIET - INCREASE WATER INTAKE (pt-stated)    . Increase physical activity (pt-stated)     I want to use the elliptical and ride my stationary bike for 30 minutes.        This is a list of the screening recommended for you and due dates:  Health Maintenance  Topic Date Due  . Flu Shot  03/19/2020*  . Mammogram  08/28/2020  . Cologuard (Stool DNA test)  09/27/2020  . Tetanus Vaccine  09/07/2023  . DEXA scan (bone density measurement)  Completed  .  Hepatitis C: One time screening is recommended by Center for Disease Control  (CDC) for  adults born from [birth year] through 1965.   Completed  . Pneumonia vaccines  Completed  *Topic was postponed. The date shown is not the original due date.

## 2019-10-11 ENCOUNTER — Ambulatory Visit
Admission: RE | Admit: 2019-10-11 | Discharge: 2019-10-11 | Disposition: A | Discharge status: Home / Self Care | Payer: Medicare Other | Source: Ambulatory Visit | Attending: Family | Admitting: Family

## 2019-10-11 ENCOUNTER — Other Ambulatory Visit: Payer: Self-pay

## 2019-10-11 DIAGNOSIS — Z1231 Encounter for screening mammogram for malignant neoplasm of breast: Secondary | ICD-10-CM | POA: Diagnosis not present

## 2019-12-03 ENCOUNTER — Telehealth: Payer: Self-pay | Admitting: Family

## 2019-12-03 DIAGNOSIS — Z0000 Encounter for general adult medical examination without abnormal findings: Secondary | ICD-10-CM

## 2019-12-03 NOTE — Telephone Encounter (Signed)
Pt has an appt with Arnett on 12/21 and would like labs, no orders. Can you put lab orders in please. I am going to schedule labs, if pt does not need labs let me know and I will call here. Thanks.

## 2019-12-06 NOTE — Telephone Encounter (Signed)
I ordered CPE labs Please sch prior to appt

## 2019-12-07 ENCOUNTER — Other Ambulatory Visit (INDEPENDENT_AMBULATORY_CARE_PROVIDER_SITE_OTHER): Payer: Medicare Other

## 2019-12-07 ENCOUNTER — Other Ambulatory Visit: Payer: Self-pay

## 2019-12-07 DIAGNOSIS — Z0000 Encounter for general adult medical examination without abnormal findings: Secondary | ICD-10-CM | POA: Diagnosis not present

## 2019-12-07 LAB — CBC WITH DIFFERENTIAL/PLATELET
Basophils Absolute: 0.1 10*3/uL (ref 0.0–0.1)
Basophils Relative: 1.1 % (ref 0.0–3.0)
Eosinophils Absolute: 0.3 10*3/uL (ref 0.0–0.7)
Eosinophils Relative: 4.2 % (ref 0.0–5.0)
HCT: 41.2 % (ref 36.0–46.0)
Hemoglobin: 13.2 g/dL (ref 12.0–15.0)
Lymphocytes Relative: 24.4 % (ref 12.0–46.0)
Lymphs Abs: 2 10*3/uL (ref 0.7–4.0)
MCHC: 32.2 g/dL (ref 30.0–36.0)
MCV: 85.8 fl (ref 78.0–100.0)
Monocytes Absolute: 0.5 10*3/uL (ref 0.1–1.0)
Monocytes Relative: 6.5 % (ref 3.0–12.0)
Neutro Abs: 5.3 10*3/uL (ref 1.4–7.7)
Neutrophils Relative %: 63.8 % (ref 43.0–77.0)
Platelets: 200 10*3/uL (ref 150.0–400.0)
RBC: 4.8 Mil/uL (ref 3.87–5.11)
RDW: 15.2 % (ref 11.5–15.5)
WBC: 8.4 10*3/uL (ref 4.0–10.5)

## 2019-12-07 LAB — COMPREHENSIVE METABOLIC PANEL
ALT: 14 U/L (ref 0–35)
AST: 15 U/L (ref 0–37)
Albumin: 4.2 g/dL (ref 3.5–5.2)
Alkaline Phosphatase: 61 U/L (ref 39–117)
BUN: 12 mg/dL (ref 6–23)
CO2: 31 mEq/L (ref 19–32)
Calcium: 9.4 mg/dL (ref 8.4–10.5)
Chloride: 102 mEq/L (ref 96–112)
Creatinine, Ser: 0.97 mg/dL (ref 0.40–1.20)
GFR: 56.28 mL/min — ABNORMAL LOW (ref >60.00)
Glucose, Bld: 93 mg/dL (ref 70–99)
Potassium: 4.5 mEq/L (ref 3.5–5.1)
Sodium: 138 mEq/L (ref 135–145)
Total Bilirubin: 0.5 mg/dL (ref 0.2–1.2)
Total Protein: 6.9 g/dL (ref 6.0–8.3)

## 2019-12-07 LAB — LIPID PANEL
Cholesterol: 197 mg/dL (ref 0–200)
HDL: 72.3 mg/dL (ref >39.00)
LDL Cholesterol: 97 mg/dL (ref 0–99)
NonHDL: 124.43
Total CHOL/HDL Ratio: 3
Triglycerides: 135 mg/dL (ref 0.0–149.0)
VLDL: 27 mg/dL (ref 0.0–40.0)

## 2019-12-07 LAB — TSH: TSH: 3.24 u[IU]/mL (ref 0.35–4.50)

## 2019-12-07 LAB — VITAMIN D 25 HYDROXY (VIT D DEFICIENCY, FRACTURES): VITD: 29.6 ng/mL — ABNORMAL LOW (ref 30.00–100.00)

## 2019-12-07 LAB — HEMOGLOBIN A1C: Hgb A1c MFr Bld: 5.5 % (ref 4.6–6.5)

## 2019-12-07 NOTE — Telephone Encounter (Signed)
I called & I have patient scheduled late today for fasting labs.

## 2019-12-10 ENCOUNTER — Other Ambulatory Visit: Payer: Self-pay

## 2019-12-10 ENCOUNTER — Encounter: Payer: Self-pay | Admitting: Family

## 2019-12-10 ENCOUNTER — Ambulatory Visit (INDEPENDENT_AMBULATORY_CARE_PROVIDER_SITE_OTHER): Payer: Medicare Other | Admitting: Family

## 2019-12-10 VITALS — BP 132/59 | HR 74 | Ht 64.5 in

## 2019-12-10 DIAGNOSIS — E559 Vitamin D deficiency, unspecified: Secondary | ICD-10-CM | POA: Diagnosis not present

## 2019-12-10 DIAGNOSIS — R030 Elevated blood-pressure reading, without diagnosis of hypertension: Secondary | ICD-10-CM

## 2019-12-10 DIAGNOSIS — Z0000 Encounter for general adult medical examination without abnormal findings: Secondary | ICD-10-CM | POA: Diagnosis not present

## 2019-12-10 DIAGNOSIS — I10 Essential (primary) hypertension: Secondary | ICD-10-CM | POA: Insufficient documentation

## 2019-12-10 DIAGNOSIS — E66813 Obesity, class 3: Secondary | ICD-10-CM

## 2019-12-10 MED ORDER — CHOLECALCIFEROL 1.25 MG (50000 UT) PO TABS
ORAL_TABLET | ORAL | 0 refills | Status: DC
Start: 2019-12-10 — End: 2019-12-13

## 2019-12-10 NOTE — Assessment & Plan Note (Signed)
Advised patient to start low-dose antihypertensive, she politely declines at this time.  Advised of risk of stroke and heart attack.  She would prefer to treat with lifestyle.  Information on DASH diet provided to patient.

## 2019-12-10 NOTE — Assessment & Plan Note (Signed)
DASH Diet, discussed NOOM. Referral to Red[facility] Grayer.

## 2019-12-10 NOTE — Patient Instructions (Addendum)
Please call call and schedule your bone density scan as discussed.   [place]  [place], [place]   Vitamin D 50,000 once a week for 8 weeks, then STOP  Referral Redgie Grayer- seminar first.   Monitor blood pressure,  Goal is less than 120/80, based on newest guidelines; if persistently higher, please make sooner follow up appointment so we can recheck you blood pressure and manage medications  Let me know if you need anything.   DASH Eating Plan DASH stands for "Dietary Approaches to Stop Hypertension." The DASH eating plan is a healthy eating plan that has been shown to reduce high blood pressure (hypertension). It may also reduce your risk for type 2 diabetes, heart disease, and stroke. The DASH eating plan may also help with weight loss. What are tips for following this plan?  General guidelines  Avoid eating more than 2,300 mg (milligrams) of salt (sodium) a day. If you have hypertension, you may need to reduce your sodium intake to 1,500 mg a day.  Limit alcohol intake to no more than 1 drink a day for nonpregnant women and 2 drinks a day for men. One drink equals 12 oz of beer, 5 oz of wine, or 1 oz of hard liquor.  Work with your health care provider to maintain a healthy body weight or to lose weight. Ask what an ideal weight is for you.  Get at least 30 minutes of exercise that causes your heart to beat faster (aerobic exercise) most days of the week. Activities may include walking, swimming, or biking.  Work with your health care provider or diet and nutrition specialist (dietitian) to adjust your eating plan to your individual calorie needs. Reading food labels   Check food labels for the amount of sodium per serving. Choose foods with less than 5 percent of the Daily Value of sodium. Generally, foods with less than 300 mg of sodium per serving fit into this eating plan.  To find whole grains, look for the word  "whole" as the first word in the ingredient list. Shopping  Buy products labeled as "low-sodium" or "no salt added."  Buy fresh foods. Avoid canned foods and premade or frozen meals. Cooking  Avoid adding salt when cooking. Use salt-free seasonings or herbs instead of table salt or sea salt. Check with your health care provider or pharmacist before using salt substitutes.  Do not fry foods. Cook foods using healthy methods such as baking, boiling, grilling, and broiling instead.  Cook with heart-healthy oils, such as olive, canola, soybean, or sunflower oil. Meal planning  Eat a balanced diet that includes: ? 5 or more servings of fruits and vegetables each day. At each meal, try to fill half of your plate with fruits and vegetables. ? Up to 6-8 servings of whole grains each day. ? Less than 6 oz of lean meat, poultry, or fish each day. A 3-oz serving of meat is about the same size as a deck of cards. One egg equals 1 oz. ? 2 servings of low-fat dairy each day. ? A serving of nuts, seeds, or beans 5 times each week. ? Heart-healthy fats. Healthy fats called Omega-3 fatty acids are found in foods such as flaxseeds and coldwater fish, like sardines, salmon, and mackerel.  Limit how much you eat of the following: ? Canned or prepackaged foods. ? Food that is high in trans fat, such as fried foods. ? Food that is high  in saturated fat, such as fatty meat. ? Sweets, desserts, sugary drinks, and other foods with added sugar. ? Full-fat dairy products.  Do not salt foods before eating.  Try to eat at least 2 vegetarian meals each week.  Eat more home-cooked food and less restaurant, buffet, and fast food.  When eating at a restaurant, ask that your food be prepared with less salt or no salt, if possible. What foods are recommended? The items listed may not be a complete list. Talk with your dietitian about what dietary choices are best for you. Grains Whole-grain or whole-wheat  bread. Whole-grain or whole-wheat pasta. Brown rice. [place]. Bulgur. Whole-grain and low-sodium cereals. Pita bread. Low-fat, low-sodium crackers. Whole-wheat flour tortillas. Vegetables Fresh or frozen vegetables (raw, steamed, roasted, or grilled). Low-sodium or reduced-sodium tomato and vegetable juice. Low-sodium or reduced-sodium tomato sauce and tomato paste. Low-sodium or reduced-sodium canned vegetables. Fruits All fresh, dried, or frozen fruit. Canned fruit in natural juice (without added sugar). Meat and other protein foods Skinless chicken or [place]. Ground chicken or [place]. Pork with fat trimmed off. Fish and seafood. Egg whites. Dried beans, peas, or lentils. Unsalted nuts, nut butters, and seeds. Unsalted canned beans. Lean cuts of beef with fat trimmed off. Low-sodium, lean deli meat. Dairy Low-fat (1%) or fat-free (skim) milk. Fat-free, low-fat, or reduced-fat cheeses. Nonfat, low-sodium ricotta or cottage cheese. Low-fat or nonfat yogurt. Low-fat, low-sodium cheese. Fats and oils Soft margarine without trans fats. Vegetable oil. Low-fat, reduced-fat, or light mayonnaise and salad dressings (reduced-sodium). Canola, safflower, olive, soybean, and sunflower oils. Avocado. Seasoning and other foods Herbs. Spices. Seasoning mixes without salt. Unsalted popcorn and pretzels. Fat-free sweets. What foods are not recommended? The items listed may not be a complete list. Talk with your dietitian about what dietary choices are best for you. Grains Baked goods made with fat, such as croissants, muffins, or some breads. Dry pasta or rice meal packs. Vegetables Creamed or fried vegetables. Vegetables in a cheese sauce. Regular canned vegetables (not low-sodium or reduced-sodium). Regular canned tomato sauce and paste (not low-sodium or reduced-sodium). Regular tomato and vegetable juice (not low-sodium or reduced-sodium). Angie Fava. Olives. Fruits Canned fruit in a light or heavy  syrup. Fried fruit. Fruit in cream or butter sauce. Meat and other protein foods Fatty cuts of meat. Ribs. Fried meat. Berniece Salines. Sausage. Bologna and other processed lunch meats. Salami. Fatback. Hotdogs. Bratwurst. Salted nuts and seeds. Canned beans with added salt. Canned or smoked fish. Whole eggs or egg yolks. Chicken or [place] with skin. Dairy Whole or 2% milk, cream, and half-and-half. Whole or full-fat cream cheese. Whole-fat or sweetened yogurt. Full-fat cheese. Nondairy creamers. Whipped toppings. Processed cheese and cheese spreads. Fats and oils Butter. Stick margarine. Lard. Shortening. Ghee. Bacon fat. Tropical oils, such as coconut, palm kernel, or palm oil. Seasoning and other foods Salted popcorn and pretzels. Onion salt, garlic salt, seasoned salt, table salt, and sea salt. Worcestershire sauce. Tartar sauce. Barbecue sauce. Teriyaki sauce. Soy sauce, including reduced-sodium. Steak sauce. Canned and packaged gravies. Fish sauce. Oyster sauce. Cocktail sauce. Horseradish that you find on the shelf. Ketchup. Mustard. Meat flavorings and tenderizers. Bouillon cubes. Hot sauce and Tabasco sauce. Premade or packaged marinades. Premade or packaged taco seasonings. Relishes. Regular salad dressings. Where to find more information:  National Heart, Lung, and [place]: https://wilson-eaton.com/  American Heart Association: www.heart.org Summary  The DASH eating plan is a healthy eating plan that has been shown to reduce high blood pressure (hypertension). It may  also reduce your risk for type 2 diabetes, heart disease, and stroke.  With the DASH eating plan, you should limit salt (sodium) intake to 2,300 mg a day. If you have hypertension, you may need to reduce your sodium intake to 1,500 mg a day.  When on the DASH eating plan, aim to eat more fresh fruits and vegetables, whole grains, lean proteins, low-fat dairy, and heart-healthy fats.  Work with your health care provider or diet and  nutrition specialist (dietitian) to adjust your eating plan to your individual calorie needs. This information is not intended to replace advice given to you by your health care provider. Make sure you discuss any questions you have with your health care provider. Document Released: 11/25/2011 Document Revised: 11/18/2017 Document Reviewed: 11/29/2016 Elsevier Patient Education  2020 [employer].  Managing Your Hypertension Hypertension is commonly called high blood pressure. This is when the force of your blood pressing against the walls of your arteries is too strong. Arteries are blood vessels that carry blood from your heart throughout your body. Hypertension forces the heart to work harder to pump blood, and may cause the arteries to become narrow or stiff. Having untreated or uncontrolled hypertension can cause heart attack, stroke, kidney disease, and other problems. What are blood pressure readings? A blood pressure reading consists of a higher number over a lower number. Ideally, your blood pressure should be below 120/80. The first ("top") number is called the systolic pressure. It is a measure of the pressure in your arteries as your heart beats. The second ("bottom") number is called the diastolic pressure. It is a measure of the pressure in your arteries as the heart relaxes. What does my blood pressure reading mean? Blood pressure is classified into four stages. Based on your blood pressure reading, your health care provider may use the following stages to determine what type of treatment you need, if any. Systolic pressure and diastolic pressure are measured in a unit called mm Hg. Normal  Systolic pressure: below 123456.  Diastolic pressure: below 80. Elevated  Systolic pressure: [id number].  Diastolic pressure: below 80. Hypertension stage 1  Systolic pressure: 0000000.  Diastolic pressure: [id number]. Hypertension stage 2  Systolic pressure: [id number] or above.  Diastolic pressure:  90 or above. What health risks are associated with hypertension? Managing your hypertension is an important responsibility. Uncontrolled hypertension can lead to:  A heart attack.  A stroke.  A weakened blood vessel (aneurysm).  Heart failure.  Kidney damage.  Eye damage.  Metabolic syndrome.  Memory and concentration problems. What changes can I make to manage my hypertension? Hypertension can be managed by making lifestyle changes and possibly by taking medicines. Your health care provider will help you make a plan to bring your blood pressure within a normal range. Eating and drinking   Eat a diet that is high in fiber and potassium, and low in salt (sodium), added sugar, and fat. An example eating plan is called the DASH (Dietary Approaches to Stop Hypertension) diet. To eat this way: ? Eat plenty of fresh fruits and vegetables. Try to fill half of your plate at each meal with fruits and vegetables. ? Eat whole grains, such as whole wheat pasta, brown rice, or whole grain bread. Fill about one quarter of your plate with whole grains. ? Eat low-fat diary products. ? Avoid fatty cuts of meat, processed or cured meats, and poultry with skin. Fill about one quarter of your plate with lean proteins such as fish, chicken  without skin, beans, eggs, and tofu. ? Avoid premade and processed foods. These tend to be higher in sodium, added sugar, and fat.  Reduce your daily sodium intake. Most people with hypertension should eat less than 1,500 mg of sodium a day.  Limit alcohol intake to no more than 1 drink a day for nonpregnant women and 2 drinks a day for men. One drink equals 12 oz of beer, 5 oz of wine, or 1 oz of hard liquor. Lifestyle  Work with your health care provider to maintain a healthy body weight, or to lose weight. Ask what an ideal weight is for you.  Get at least 30 minutes of exercise that causes your heart to beat faster (aerobic exercise) most days of the week.  Activities may include walking, swimming, or biking.  Include exercise to strengthen your muscles (resistance exercise), such as weight lifting, as part of your weekly exercise routine. Try to do these types of exercises for 30 minutes at least 3 days a week.  Do not use any products that contain nicotine or tobacco, such as cigarettes and e-cigarettes. If you need help quitting, ask your health care provider.  Control any long-term (chronic) conditions you have, such as high cholesterol or diabetes. Monitoring  Monitor your blood pressure at home as told by your health care provider. Your personal target blood pressure may vary depending on your medical conditions, your age, and other factors.  Have your blood pressure checked regularly, as often as told by your health care provider. Working with your health care provider  Review all the medicines you take with your health care provider because there may be side effects or interactions.  Talk with your health care provider about your diet, exercise habits, and other lifestyle factors that may be contributing to hypertension.  Visit your health care provider regularly. Your health care provider can help you create and adjust your plan for managing hypertension. Will I need medicine to control my blood pressure? Your health care provider may prescribe medicine if lifestyle changes are not enough to get your blood pressure under control, and if:  Your systolic blood pressure is 130 or higher.  Your diastolic blood pressure is 80 or higher. Take medicines only as told by your health care provider. Follow the directions carefully. Blood pressure medicines must be taken as prescribed. The medicine does not work as well when you skip doses. Skipping doses also puts you at risk for problems. Contact a health care provider if:  You think you are having a reaction to medicines you have taken.  You have repeated (recurrent) headaches.  You feel  dizzy.  You have swelling in your ankles.  You have trouble with your vision. Get help right away if:  You develop a severe headache or confusion.  You have unusual weakness or numbness, or you feel faint.  You have severe pain in your chest or abdomen.  You vomit repeatedly.  You have trouble breathing. Summary  Hypertension is when the force of blood pumping through your arteries is too strong. If this condition is not controlled, it may put you at risk for serious complications.  Your personal target blood pressure may vary depending on your medical conditions, your age, and other factors. For most people, a normal blood pressure is less than 120/80.  Hypertension is managed by lifestyle changes, medicines, or both. Lifestyle changes include weight loss, eating a healthy, low-sodium diet, exercising more, and limiting alcohol. This information is not intended  to replace advice given to you by your health care provider. Make sure you discuss any questions you have with your health care provider. Document Released: 08/30/2012 Document Revised: 03/30/2019 Document Reviewed: 11/03/2016 Elsevier Patient Education  2020 [employer].

## 2019-12-10 NOTE — Assessment & Plan Note (Addendum)
Mammogram and cologuard UTD. Encourage SBE. Ordered bone density, patient understands schedule Discussed labs, patient politely declines statin therapy at this time.  We discussed low vitamin D and decided to do megadose for 8 weeks and recheck

## 2019-12-10 NOTE — Progress Notes (Signed)
Virtual Visit via Video Note  I connected with@  on 12/10/19 at  8:00 AM EST by a video enabled telemedicine application and verified that I am speaking with the correct person using two identifiers.  Location patient: home Location provider: home office Persons participating in the virtual visit: patient, provider  I discussed the limitations of evaluation and management by telemedicine and the availability of in person appointments. The patient expressed understanding and agreed to proceed.   HPI:  Annual physical. Feels well. Remains frustrated by weight.  Weight stable.  Occasional checks blood pressure at home with wrist cuff.  Elevated cardiovascular risk On 8000 vitamin d   Rides bike 30 minutes every day, walking up and down steps. Walking dog. No CP, SOB. Negative cologuard 2018. Regular bowl movements UTD mammogram. No changes to breast.   ROS: See pertinent positives and negatives per HPI.  Past Medical History:  Diagnosis Date  . Allergy     Past Surgical History:  Procedure Laterality Date  . ABDOMINAL HYSTERECTOMY    . BILATERAL OOPHORECTOMY    . BREAST BIOPSY    . CHOLECYSTECTOMY  1994    Family History  Problem Relation Age of Onset  . Kidney disease Mother        nephrolithiasis  . Dementia Mother   . Hypertension Mother   . Mental retardation Sister 8       possible autism,  now with depression  . Dementia Sister   . Cancer Maternal Aunt        breast cancer  . Dementia Maternal Aunt   . Mental retardation Maternal Aunt   . Breast cancer Maternal Aunt   . Dementia Maternal Uncle   . Cancer Maternal Grandmother        breast cancer  . Breast cancer Maternal Grandmother   . Heart attack Paternal Grandmother   . Colon cancer Neg Hx     SOCIAL HX: former smoker   Current Outpatient Medications:  .  aspirin EC 81 MG tablet, Take by mouth., Disp: , Rfl:  .  BIOTIN PO, Take by mouth., Disp: , Rfl:  .  cetirizine (ZYRTEC) 10 MG tablet, Take  10 mg by mouth daily., Disp: , Rfl:  .  Cholecalciferol (VITAMIN D) 50 MCG (2000 UT) tablet, Take 6,000 Units by mouth daily., Disp: , Rfl:  .  CRANBERRY EXTRACT PO, Take by mouth daily., Disp: , Rfl:  .  Zinc 50 MG CAPS, Take by mouth., Disp: , Rfl:  .  Cholecalciferol 1.25 MG (50000 UT) TABS, 50,000 units PO qwk for 8 weeks., Disp: 8 tablet, Rfl: 0  EXAM:  VITALS per patient if applicable: Vitals:   [id number]  BP: (!) 132/59  Pulse: 74  SpO2: 95%   BP Readings from Last 3 Encounters:  12/10/19 (!) 132/59  11/21/18 140/80  10/23/18 134/68   Wt Readings from Last 3 Encounters:  11/21/18 201 lb (91.2 kg)  10/23/18 234 lb (106.1 kg)  09/27/18 233 lb 12.8 oz (106.1 kg)   Body mass index is 33.97 kg/m.  GENERAL: alert, oriented, appears well and in no acute distress  HEENT: atraumatic, conjunttiva clear, no obvious abnormalities on inspection of external nose and ears  NECK: normal movements of the head and neck  LUNGS: on inspection no signs of respiratory distress, breathing rate appears normal, no obvious gross SOB, gasping or wheezing  CV: no obvious cyanosis  MS: moves all visible extremities without noticeable abnormality  PSYCH/NEURO: pleasant and cooperative,  no obvious depression or anxiety, speech and thought processing grossly intact  ASSESSMENT AND PLAN:  Discussed the following assessment and plan:  Routine general medical examination at a health care facility - Plan: DG Bone Density  Obesity, Class III, BMI 40-49.9 (morbid obesity) ([place]) - Plan: Dr Carolann Littler Ref to Medical Weight Management  Vitamin D deficiency - Plan: Cholecalciferol 1.25 MG (50000 UT) TABS Problem List Items Addressed This Visit      Other   Obesity, Class III, BMI 40-49.9 (morbid obesity) ([place])    DASH Diet, discussed NOOM. Referral to Redgie Grayer.       Relevant Orders   Dr Carolann Littler Ref to Medical Weight Management   Routine general medical examination  at a health care facility - Primary    Mammogram and cologuard UTD. Encourage SBE. Ordered bone density, patient understands schedule Discussed labs, patient politely declines statin therapy at this time.  We discussed low vitamin D and decided to do megadose for 8 weeks and recheck      Relevant Orders   DG Bone Density    Other Visit Diagnoses    Vitamin D deficiency       Relevant Medications   Cholecalciferol 1.25 MG (50000 UT) TABS      -we discussed possible serious and likely etiologies, options for evaluation and workup, limitations of telemedicine visit vs in person visit, treatment, treatment risks and precautions. Pt prefers to treat via telemedicine empirically rather then risking or undertaking an in person visit at this moment. Patient agrees to seek prompt in person care if worsening, new symptoms arise, or if is not improving with treatment.   I discussed the assessment and treatment plan with the patient. The patient was provided an opportunity to ask questions and all were answered. The patient agreed with the plan and demonstrated an understanding of the instructions.   The patient was advised to call back or seek an in-person evaluation if the symptoms worsen or if the condition fails to improve as anticipated.   Mable Paris, FNP

## 2019-12-13 ENCOUNTER — Telehealth: Payer: Self-pay | Admitting: Family

## 2019-12-13 ENCOUNTER — Other Ambulatory Visit: Payer: Self-pay

## 2019-12-13 DIAGNOSIS — E559 Vitamin D deficiency, unspecified: Secondary | ICD-10-CM

## 2019-12-13 MED ORDER — CHOLECALCIFEROL 1.25 MG (50000 UT) PO TABS: ORAL_TABLET | ORAL | 0 refills | Status: DC

## 2019-12-13 NOTE — Telephone Encounter (Signed)
I called and LM that I had sent Vitamin D to patient;s pharmacy.

## 2019-12-13 NOTE — Telephone Encounter (Signed)
Pt has not received Vit D refill. Was seen last week by M. Arnett.

## 2019-12-26 DIAGNOSIS — H524 Presbyopia: Secondary | ICD-10-CM | POA: Diagnosis not present

## 2020-01-01 ENCOUNTER — Encounter: Payer: Self-pay | Admitting: Family

## 2020-01-01 ENCOUNTER — Telehealth: Payer: Self-pay | Admitting: Family

## 2020-01-02 ENCOUNTER — Ambulatory Visit
Admission: RE | Admit: 2020-01-02 | Discharge: 2020-01-02 | Disposition: A | Discharge status: Home / Self Care | Payer: Medicare Other | Source: Ambulatory Visit | Attending: Family | Admitting: Family

## 2020-01-02 DIAGNOSIS — Z0000 Encounter for general adult medical examination without abnormal findings: Secondary | ICD-10-CM

## 2020-01-02 DIAGNOSIS — Z780 Asymptomatic menopausal state: Secondary | ICD-10-CM | POA: Insufficient documentation

## 2020-01-02 DIAGNOSIS — Z13820 Encounter for screening for osteoporosis: Secondary | ICD-10-CM | POA: Diagnosis not present

## 2020-01-02 DIAGNOSIS — M85851 Other specified disorders of bone density and structure, right thigh: Secondary | ICD-10-CM | POA: Diagnosis not present

## 2020-01-02 NOTE — Telephone Encounter (Signed)
err

## 2020-01-23 ENCOUNTER — Encounter: Payer: Self-pay | Admitting: Family

## 2020-01-23 ENCOUNTER — Other Ambulatory Visit: Payer: Self-pay

## 2020-01-23 ENCOUNTER — Ambulatory Visit (INDEPENDENT_AMBULATORY_CARE_PROVIDER_SITE_OTHER): Payer: Medicare Other | Admitting: Family

## 2020-01-23 DIAGNOSIS — M8580 Other specified disorders of bone density and structure, unspecified site: Secondary | ICD-10-CM | POA: Diagnosis not present

## 2020-01-23 DIAGNOSIS — R030 Elevated blood-pressure reading, without diagnosis of hypertension: Secondary | ICD-10-CM

## 2020-01-23 NOTE — Progress Notes (Signed)
Virtual Visit via Video Note  I connected with@  on 01/23/20 at 11:30 AM EST by a video enabled telemedicine application and verified that I am speaking with the correct person using two identifiers.  Location patient: home Location provider:work  Persons participating in the virtual visit: patient, provider  I discussed the limitations of evaluation and management by telemedicine and the availability of in person appointments. The patient expressed understanding and agreed to proceed.   HPI:  Follow-up today to discuss treatment for osteoporosis. Feels well. Wants to discuss side effects of fosamax.  H/o jaw pain from grinding teeth.   On vitamin D mega dose for 8 weeks. Taking Calcium  hasnt been able to check blood pressure . Working on losing weight.  No CP, sob.  Exercising half an hour every day.   ROS: See pertinent positives and negatives per HPI.  Past Medical History:  Diagnosis Date  . Allergy     Past Surgical History:  Procedure Laterality Date  . ABDOMINAL HYSTERECTOMY    . BILATERAL OOPHORECTOMY    . BREAST BIOPSY    . CHOLECYSTECTOMY  1994    Family History  Problem Relation Age of Onset  . Kidney disease Mother        nephrolithiasis  . Dementia Mother   . Hypertension Mother   . Mental retardation Sister 8       possible autism,  now with depression  . Dementia Sister   . Cancer Maternal Aunt        breast cancer  . Dementia Maternal Aunt   . Mental retardation Maternal Aunt   . Breast cancer Maternal Aunt   . Dementia Maternal Uncle   . Cancer Maternal Grandmother        breast cancer  . Breast cancer Maternal Grandmother   . Heart attack Paternal Grandmother   . Colon cancer Neg Hx     SOCIAL HX: former smoker   Current Outpatient Medications:  .  aspirin EC 81 MG tablet, Take by mouth., Disp: , Rfl:  .  BIOTIN PO, Take by mouth., Disp: , Rfl:  .  cetirizine (ZYRTEC) 10 MG tablet, Take 10 mg by mouth daily., Disp: , Rfl:  .   Cholecalciferol (VITAMIN D) 50 MCG (2000 UT) tablet, Take 6,000 Units by mouth daily., Disp: , Rfl:  .  Cholecalciferol 1.25 MG (50000 UT) TABS, 50,000 units PO qwk for 8 weeks., Disp: 8 tablet, Rfl: 0 .  CRANBERRY EXTRACT PO, Take by mouth daily., Disp: , Rfl:  .  UNABLE TO FIND, Take 1 capsule by mouth daily. Beet Root 6000 mg, Disp: , Rfl:  .  Zinc 50 MG CAPS, Take by mouth., Disp: , Rfl:   EXAM:  VITALS per patient if applicable:  GENERAL: alert, oriented, appears well and in no acute distress  HEENT: atraumatic, conjunttiva clear, no obvious abnormalities on inspection of external nose and ears  NECK: normal movements of the head and neck  LUNGS: on inspection no signs of respiratory distress, breathing rate appears normal, no obvious gross SOB, gasping or wheezing  CV: no obvious cyanosis  MS: moves all visible extremities without noticeable abnormality  PSYCH/NEURO: pleasant and cooperative, no obvious depression or anxiety, speech and thought processing grossly intact  ASSESSMENT AND PLAN:  Discussed the following assessment and plan:  Elevated blood pressure reading  Osteopenia, unspecified location Problem List Items Addressed This Visit      Musculoskeletal and Integument   Osteopenia    Discussed  DEXA results at length. No significant worsening. She also shares concerns with regards to side effects of biphosphonates.  She declines medication at this time .we will repeat dexa in 2023.         Other   Elevated blood pressure reading    Unsure if controlled or improved. Advised to send blood pressure readings to ensure goal < 120/80. She actively is working on losing weight.          -we discussed possible serious and likely etiologies, options for evaluation and workup, limitations of telemedicine visit vs in person visit, treatment, treatment risks and precautions. Pt prefers to treat via telemedicine empirically rather then risking or undertaking an in  person visit at this moment. Patient agrees to seek prompt in person care if worsening, new symptoms arise, or if is not improving with treatment.   I discussed the assessment and treatment plan with the patient. The patient was provided an opportunity to ask questions and all were answered. The patient agreed with the plan and demonstrated an understanding of the instructions.   The patient was advised to call back or seek an in-person evaluation if the symptoms worsen or if the condition fails to improve as anticipated.   Mable Paris, FNP

## 2020-01-23 NOTE — Patient Instructions (Addendum)
  Stay safe.    As discussed:  For post menopausal women, guidelines recommend a diet with 1200 mg of Calcium per day. If you are eating calcium rich foods, you do not need a calcium supplement. The body better absorbs the calcium that you eat over supplementation. If you do supplement, I recommend not supplementing the full 1200 mg/ day as this can lead to increased risk of cardiovascular disease. I recommend Calcium Citrate over the counter, and you may take a total of 600 to 800 mg per day in divided doses with meals for best absorption.   For bone health, you need adequate vitamin D, and I recommend you supplement as it is harder to do so with diet alone. I recommend cholecalciferol 800 units daily.  Also, please ensure you are following a diet high in calcium -- research shows better outcomes with dietary sources including kale, yogurt, broccolii, cheese, okra, almonds- to name a few.     Also remember that exercise is a great medicine for maintain and preserve bone health. Advise moderate exercise for 30 minutes , 3 times per week.

## 2020-01-23 NOTE — Assessment & Plan Note (Addendum)
Unsure if controlled or improved. Advised to send blood pressure readings to ensure goal < 120/80. She actively is working on losing weight.

## 2020-01-23 NOTE — Assessment & Plan Note (Addendum)
Discussed DEXA results at length. No significant worsening. She also shares concerns with regards to side effects of biphosphonates.  She declines medication at this time .we will repeat dexa in 2023.

## 2020-04-02 ENCOUNTER — Telehealth: Payer: Self-pay | Admitting: Family

## 2020-04-02 NOTE — Telephone Encounter (Signed)
Patient wants come in the winter months  Note per ofc from referral to [place]

## 2020-04-15 DIAGNOSIS — Z0120 Encounter for dental examination and cleaning without abnormal findings: Secondary | ICD-10-CM | POA: Diagnosis not present

## 2020-04-24 DIAGNOSIS — H2513 Age-related nuclear cataract, bilateral: Secondary | ICD-10-CM | POA: Diagnosis not present

## 2020-04-24 DIAGNOSIS — H40033 Anatomical narrow angle, bilateral: Secondary | ICD-10-CM | POA: Diagnosis not present

## 2020-05-29 ENCOUNTER — Encounter: Payer: Self-pay | Admitting: Family

## 2020-06-02 ENCOUNTER — Telehealth: Payer: Self-pay | Admitting: Family

## 2020-06-02 DIAGNOSIS — M8580 Other specified disorders of bone density and structure, unspecified site: Secondary | ICD-10-CM

## 2020-06-02 NOTE — Telephone Encounter (Signed)
Call pt Please sch in office visit so we can assess BP I have ordered vit D and covid abs which she may have done before or same day as visit

## 2020-06-02 NOTE — Telephone Encounter (Signed)
Patient scheduled lab visit for 06/10/20. Patient is currently going to the beach, and then leaving for a convention and then going to the beach again. Krista Gentry states that she will call to schedule an office visit once her schedule clears.

## 2020-06-10 ENCOUNTER — Other Ambulatory Visit: Payer: Self-pay

## 2020-06-10 ENCOUNTER — Other Ambulatory Visit (INDEPENDENT_AMBULATORY_CARE_PROVIDER_SITE_OTHER): Payer: Medicare Other

## 2020-06-10 DIAGNOSIS — M8580 Other specified disorders of bone density and structure, unspecified site: Secondary | ICD-10-CM

## 2020-06-10 LAB — VITAMIN D 25 HYDROXY (VIT D DEFICIENCY, FRACTURES): VITD: 38.55 ng/mL (ref 30.00–100.00)

## 2020-06-11 LAB — SARS COV-2 SEROLOGY(COVID-19)AB(IGG,IGM),IMMUNOASSAY
SARS CoV-2 AB IgG: NEGATIVE
SARS CoV-2 IgM: NEGATIVE

## 2020-07-25 ENCOUNTER — Other Ambulatory Visit: Payer: Self-pay

## 2020-07-25 ENCOUNTER — Ambulatory Visit: Payer: Medicare Other | Admitting: Family

## 2020-07-25 VITALS — BP 140/80 | HR 87 | Temp 98.6°F | Resp 16 | Wt 229.0 lb

## 2020-07-25 DIAGNOSIS — R202 Paresthesia of skin: Secondary | ICD-10-CM

## 2020-07-25 DIAGNOSIS — I10 Essential (primary) hypertension: Secondary | ICD-10-CM

## 2020-07-25 DIAGNOSIS — R200 Anesthesia of skin: Secondary | ICD-10-CM

## 2020-07-25 MED ORDER — GABAPENTIN 100 MG PO CAPS: 100.0000 mg | ORAL_CAPSULE | Freq: Every day | ORAL | 0 refills | Status: DC

## 2020-07-25 NOTE — Assessment & Plan Note (Signed)
Presentation consistent with carpal tunnel.  Advised to use carpal tunnel splint which she already has at home.  Advised Tylenol arthritis and gabapentin at bedtime.  If no improvement conservative management will consult [facility]

## 2020-07-25 NOTE — Patient Instructions (Addendum)
Trial carpal tunnel splint that you have  Trial gabapentin 100mg  at bedtime  PLEASE monitor you blood pressure  Monitor blood pressure at home and me 5-6 reading on separate days. Goal is less than 120/80, based on newest guidelines, however we certainly want to be less than 130/80;  if persistently higher, please make sooner follow up appointment so we can recheck you blood pressure and manage/ adjust medications.  Carpal Tunnel Syndrome  Carpal tunnel syndrome is a condition that causes pain in your hand and arm. The carpal tunnel is a narrow area that is on the palm side of your wrist. Repeated wrist motion or certain diseases may cause swelling in the tunnel. This swelling can pinch the main nerve in the wrist (median nerve). What are the causes? This condition may be caused by:  Repeated wrist motions.  Wrist injuries.  Arthritis.  A sac of fluid (cyst) or abnormal growth (tumor) in the carpal tunnel.  Fluid buildup during pregnancy. Sometimes the cause is not known. What increases the risk? The following factors may make you more likely to develop this condition:  Having a job in which you move your wrist in the same way many times. This includes jobs like being a Software engineer or a Scientist, water quality.  Being a woman.  Having other health conditions, such as: ? Diabetes. ? Obesity. ? A thyroid gland that is not active enough (hypothyroidism). ? Kidney failure. What are the signs or symptoms? Symptoms of this condition include:  A tingling feeling in your fingers.  Tingling or a loss of feeling (numbness) in your hand.  Pain in your entire arm. This pain may get worse when you bend your wrist and elbow for a long time.  Pain in your wrist that goes up your arm to your shoulder.  Pain that goes down into your palm or fingers.  A weak feeling in your hands. You may find it hard to grab and hold items. You may feel worse at night. How is this diagnosed? This condition is  diagnosed with a medical history and physical exam. You may also have tests, such as:  Electromyogram (EMG). This test checks the signals that the nerves send to the muscles.  Nerve conduction study. This test checks how well signals pass through your nerves.  Imaging tests, such as X-rays, ultrasound, and MRI. These tests check for what might be the cause of your condition. How is this treated? This condition may be treated with:  Lifestyle changes. You will be asked to stop or change the activity that caused your problem.  Doing exercise and activities that make bones and muscles stronger (physical therapy).  Learning how to use your hand again (occupational therapy).  Medicines for pain and swelling (inflammation). You may have injections in your wrist.  A wrist splint.  Surgery. Follow these instructions at home: If you have a splint:  Wear the splint as told by your doctor. Remove it only as told by your doctor.  Loosen the splint if your fingers: ? Tingle. ? Lose feeling (become numb). ? Turn cold and blue.  Keep the splint clean.  If the splint is not waterproof: ? Do not let it get wet. ? Cover it with a watertight covering when you take a bath or a shower. Managing pain, stiffness, and swelling   If told, put ice on the painful area: ? If you have a removable splint, remove it as told by your doctor. ? Put ice in a plastic bag. ?  Place a towel between your skin and the bag. ? Leave the ice on for 20 minutes, 2-3 times per day. General instructions  Take over-the-counter and prescription medicines only as told by your doctor.  Rest your wrist from any activity that may cause pain. If needed, talk with your boss at work about changes that can help your wrist heal.  Do any exercises as told by your doctor, physical therapist, or occupational therapist.  Keep all follow-up visits as told by your doctor. This is important. Contact a doctor if:  You have new  symptoms.  Medicine does not help your pain.  Your symptoms get worse. Get help right away if:  You have very bad numbness or tingling in your wrist or hand. Summary  Carpal tunnel syndrome is a condition that causes pain in your hand and arm.  It is often caused by repeated wrist motions.  Lifestyle changes and medicines are used to treat this problem. Surgery may help in very bad cases.  Follow your doctor's instructions about wearing a splint, resting your wrist, keeping follow-up visits, and calling for help. This information is not intended to replace advice given to you by your health care provider. Make sure you discuss any questions you have with your health care provider. Document Revised: 04/14/2018 Document Reviewed: 04/14/2018 Elsevier Patient Education  [place] Your Hypertension Hypertension is commonly called high blood pressure. This is when the force of your blood pressing against the walls of your arteries is too strong. Arteries are blood vessels that carry blood from your heart throughout your body. Hypertension forces the heart to work harder to pump blood, and may cause the arteries to become narrow or stiff. Having untreated or uncontrolled hypertension can cause heart attack, stroke, kidney disease, and other problems. What are blood pressure readings? A blood pressure reading consists of a higher number over a lower number. Ideally, your blood pressure should be below 120/80. The first ("top") number is called the systolic pressure. It is a measure of the pressure in your arteries as your heart beats. The second ("bottom") number is called the diastolic pressure. It is a measure of the pressure in your arteries as the heart relaxes. What does my blood pressure reading mean? Blood pressure is classified into four stages. Based on your blood pressure reading, your health care provider may use the following stages to determine what type of  treatment you need, if any. Systolic pressure and diastolic pressure are measured in a unit called mm Hg. Normal  Systolic pressure: below 161.  Diastolic pressure: below 80. Elevated  Systolic pressure: 096-045.  Diastolic pressure: below 80. Hypertension stage 1  Systolic pressure: 409-811.  Diastolic pressure: 91-47. Hypertension stage 2  Systolic pressure: 829 or above.  Diastolic pressure: 90 or above. What health risks are associated with hypertension? Managing your hypertension is an important responsibility. Uncontrolled hypertension can lead to:  A heart attack.  A stroke.  A weakened blood vessel (aneurysm).  Heart failure.  Kidney damage.  Eye damage.  Metabolic syndrome.  Memory and concentration problems. What changes can I make to manage my hypertension? Hypertension can be managed by making lifestyle changes and possibly by taking medicines. Your health care provider will help you make a plan to bring your blood pressure within a normal range. Eating and drinking   Eat a diet that is high in fiber and potassium, and low in salt (sodium), added sugar, and fat. An example  eating plan is called the DASH (Dietary Approaches to Stop Hypertension) diet. To eat this way: ? Eat plenty of fresh fruits and vegetables. Try to fill half of your plate at each meal with fruits and vegetables. ? Eat whole grains, such as whole wheat pasta, brown rice, or whole grain bread. Fill about one quarter of your plate with whole grains. ? Eat low-fat diary products. ? Avoid fatty cuts of meat, processed or cured meats, and poultry with skin. Fill about one quarter of your plate with lean proteins such as fish, chicken without skin, beans, eggs, and tofu. ? Avoid premade and processed foods. These tend to be higher in sodium, added sugar, and fat.  Reduce your daily sodium intake. Most people with hypertension should eat less than 1,500 mg of sodium a day.  Limit alcohol  intake to no more than 1 drink a day for nonpregnant women and 2 drinks a day for men. One drink equals 12 oz of beer, 5 oz of wine, or 1 oz of hard liquor. Lifestyle  Work with your health care provider to maintain a healthy body weight, or to lose weight. Ask what an ideal weight is for you.  Get at least 30 minutes of exercise that causes your heart to beat faster (aerobic exercise) most days of the week. Activities may include walking, swimming, or biking.  Include exercise to strengthen your muscles (resistance exercise), such as weight lifting, as part of your weekly exercise routine. Try to do these types of exercises for 30 minutes at least 3 days a week.  Do not use any products that contain nicotine or tobacco, such as cigarettes and e-cigarettes. If you need help quitting, ask your health care provider.  Control any long-term (chronic) conditions you have, such as high cholesterol or diabetes. Monitoring  Monitor your blood pressure at home as told by your health care provider. Your personal target blood pressure may vary depending on your medical conditions, your age, and other factors.  Have your blood pressure checked regularly, as often as told by your health care provider. Working with your health care provider  Review all the medicines you take with your health care provider because there may be side effects or interactions.  Talk with your health care provider about your diet, exercise habits, and other lifestyle factors that may be contributing to hypertension.  Visit your health care provider regularly. Your health care provider can help you create and adjust your plan for managing hypertension. Will I need medicine to control my blood pressure? Your health care provider may prescribe medicine if lifestyle changes are not enough to get your blood pressure under control, and if:  Your systolic blood pressure is 130 or higher.  Your diastolic blood pressure is 80 or  higher. Take medicines only as told by your health care provider. Follow the directions carefully. Blood pressure medicines must be taken as prescribed. The medicine does not work as well when you skip doses. Skipping doses also puts you at risk for problems. Contact a health care provider if:  You think you are having a reaction to medicines you have taken.  You have repeated (recurrent) headaches.  You feel dizzy.  You have swelling in your ankles.  You have trouble with your vision. Get help right away if:  You develop a severe headache or confusion.  You have unusual weakness or numbness, or you feel faint.  You have severe pain in your chest or abdomen.  You vomit  repeatedly.  You have trouble breathing. Summary  Hypertension is when the force of blood pumping through your arteries is too strong. If this condition is not controlled, it may put you at risk for serious complications.  Your personal target blood pressure may vary depending on your medical conditions, your age, and other factors. For most people, a normal blood pressure is less than 120/80.  Hypertension is managed by lifestyle changes, medicines, or both. Lifestyle changes include weight loss, eating a healthy, low-sodium diet, exercising more, and limiting alcohol. This information is not intended to replace advice given to you by your health care provider. Make sure you discuss any questions you have with your health care provider. Document Revised: 03/30/2019 Document Reviewed: 11/03/2016 Elsevier Patient Education  [place].

## 2020-07-25 NOTE — Progress Notes (Signed)
Subjective:    Patient ID: Krista Gentry, female    DOB: April 02, 1946, 74 y.o.   MRN: [id number]  CC: Krista Gentry is a 74 y.o. female who presents today for follow up.   HPI: Continues to work on weight loss Fasts for 16 hours.  Blood pressure ( with wrist cuff) at home around 141/80. No cp.  She has never been treated for hypertension, history of low blood pressure.  She thinks blood pressures related to weight gain  Complains of numbness in 2nd, 3rd, 4th digit for several weeks, unchanged. Numbness from wrist down. Quilting with a machine.  Has not tried medication for this. No neck pain    right handed.  HISTORY:  Past Medical History:  Diagnosis Date  . Allergy    Past Surgical History:  Procedure Laterality Date  . ABDOMINAL HYSTERECTOMY    . BILATERAL OOPHORECTOMY    . BREAST BIOPSY    . CHOLECYSTECTOMY  1994   Family History  Problem Relation Age of Onset  . Kidney disease Mother        nephrolithiasis  . Dementia Mother   . Hypertension Mother   . Mental retardation Sister 8       possible autism,  now with depression  . Dementia Sister   . Cancer Maternal Aunt        breast cancer  . Dementia Maternal Aunt   . Mental retardation Maternal Aunt   . Breast cancer Maternal Aunt   . Dementia Maternal Uncle   . Cancer Maternal Grandmother        breast cancer  . Breast cancer Maternal Grandmother   . Heart attack Paternal Grandmother   . Colon cancer Neg Hx     Allergies: Patient has no known allergies. Current Outpatient Medications on File Prior to Visit  Medication Sig Dispense Refill  . Menaquinone-7 (VITAMIN K2 PO) Take 600 mcg by mouth daily.    Marland Kitchen aspirin EC 81 MG tablet Take by mouth.    Marland Kitchen BIOTIN PO Take by mouth.    . cetirizine (ZYRTEC) 10 MG tablet Take 10 mg by mouth daily.    . Cholecalciferol (VITAMIN D) 50 MCG (2000 UT) tablet Take 6,000 Units by mouth daily.    Marland Kitchen CRANBERRY EXTRACT PO Take by mouth daily.    Marland Kitchen UNABLE TO FIND Take 1  capsule by mouth daily. Beet Root 6000 mg    . Zinc 50 MG CAPS Take by mouth.     No current facility-administered medications on file prior to visit.    Social History   Tobacco Use  . Smoking status: Former Smoker    Packs/day: 1.00    Years: 5.00    Pack years: 5.00    Types: Cigarettes    Quit date: 04/14/1970    Years since quitting: 50.3  . Smokeless tobacco: Never Used  Vaping Use  . Vaping Use: Never used  Substance Use Topics  . Alcohol use: Yes    Alcohol/week: 6.0 standard drinks    Types: 2 Glasses of wine, 1 Cans of beer, 3 Shots of liquor per week    Comment: occassion  . Drug use: No    Review of Systems  Constitutional: Negative for chills, fever and unexpected weight change.  Respiratory: Negative for cough.   Cardiovascular: Negative for chest pain and palpitations.  Gastrointestinal: Negative for nausea and vomiting.  Neurological: Positive for numbness. Negative for headaches.      Objective:  BP 140/80   Pulse 87   Temp 98.6 F (37 C) (Oral)   Resp 16   Wt 229 lb (103.9 kg)   SpO2 97%   BMI 38.70 kg/m  BP Readings from Last 3 Encounters:  07/25/20 140/80  12/10/19 (!) 132/59  11/21/18 140/80   Wt Readings from Last 3 Encounters:  07/25/20 229 lb (103.9 kg)  01/23/20 238 lb (108 kg)  11/21/18 201 lb (91.2 kg)    Physical Exam Vitals reviewed.  Constitutional:      Appearance: She is well-developed.  Eyes:     Conjunctiva/sclera: Conjunctivae normal.  Cardiovascular:     Rate and Rhythm: Normal rate and regular rhythm.     Pulses: Normal pulses.     Heart sounds: Normal heart sounds.  Pulmonary:     Effort: Pulmonary effort is normal.     Breath sounds: Normal breath sounds. No wheezing, rhonchi or rales.  Musculoskeletal:     Right wrist: Normal. No swelling, bony tenderness or snuff box tenderness.     Comments: Grip strength normal. Palpable radial pulses and sensation intact. Negative phalen and tinel test. No pain or  limited ROM with  okay sign. No tenderness of CMC or snuffbox tenderness noted.  No tenderness or bony step off along ulnar or radial border of wrist.  No pain with resisted wrist dorsiflexion.   Skin:    General: Skin is warm and dry.  Neurological:     Mental Status: She is alert.  Psychiatric:        Speech: Speech normal.        Behavior: Behavior normal.        Thought Content: Thought content normal.        Assessment & Plan:   Problem List Items Addressed This Visit      Cardiovascular and Mediastinum   HTN (hypertension)    Third separate reading in which blood pressure is elevated.  advised today start low-dose amlodipine.  Patient quite declines that she is working very hard on her weight loss.  She would like to continue working on weight loss and monitor her blood pressure at home.  Advised to return in 6 weeks.        Other   Numbness and tingling in right hand - Primary    Presentation consistent with carpal tunnel.  Advised to use carpal tunnel splint which she already has at home.  Advised Tylenol arthritis and gabapentin at bedtime.  If no improvement conservative management will consult [facility]      Relevant Medications   gabapentin (NEURONTIN) 100 MG capsule       I have discontinued Foy [place]. Fines's Cholecalciferol. I am also having her start on gabapentin. Additionally, I am having her maintain her BIOTIN PO, CRANBERRY EXTRACT PO, aspirin EC, cetirizine, Zinc, Vitamin D, UNABLE TO FIND, and Menaquinone-7 (VITAMIN K2 PO).   Meds ordered this encounter  Medications  . gabapentin (NEURONTIN) 100 MG capsule    Sig: Take 1 capsule (100 mg total) by mouth at bedtime.    Dispense:  90 capsule    Refill:  0    Order Specific Question:   Supervising Provider    Answer:   Crecencio Mc [[id number]]    Return precautions given.   Risks, benefits, and alternatives of the medications and treatment plan prescribed today were discussed, and patient  expressed understanding.   Education regarding symptom management and diagnosis given to patient on AVS.  Continue to follow  with Burnard Hawthorne, FNP for routine health maintenance.   Johney Frame and I agreed with plan.   Mable Paris, FNP

## 2020-07-25 NOTE — Assessment & Plan Note (Signed)
Third separate reading in which blood pressure is elevated.  advised today start low-dose amlodipine.  Patient quite declines that she is working very hard on her weight loss.  She would like to continue working on weight loss and monitor her blood pressure at home.  Advised to return in 6 weeks.

## 2020-07-30 ENCOUNTER — Encounter: Payer: Self-pay | Admitting: Family

## 2020-09-08 ENCOUNTER — Encounter: Payer: Self-pay | Admitting: Family

## 2020-09-08 ENCOUNTER — Other Ambulatory Visit: Payer: Self-pay

## 2020-09-08 ENCOUNTER — Telehealth (INDEPENDENT_AMBULATORY_CARE_PROVIDER_SITE_OTHER): Payer: Medicare Other | Admitting: Family

## 2020-09-08 VITALS — BP 120/82 | HR 87 | Ht 64.0 in | Wt 227.0 lb

## 2020-09-08 DIAGNOSIS — Z7982 Long term (current) use of aspirin: Secondary | ICD-10-CM

## 2020-09-08 DIAGNOSIS — I10 Essential (primary) hypertension: Secondary | ICD-10-CM | POA: Diagnosis not present

## 2020-09-08 DIAGNOSIS — R202 Paresthesia of skin: Secondary | ICD-10-CM

## 2020-09-08 DIAGNOSIS — R200 Anesthesia of skin: Secondary | ICD-10-CM | POA: Diagnosis not present

## 2020-09-08 MED ORDER — AMLODIPINE BESYLATE 2.5 MG PO TABS: 2.5000 mg | ORAL_TABLET | Freq: Every day | ORAL | 3 refills | Status: DC

## 2020-09-08 NOTE — Assessment & Plan Note (Addendum)
Due to age, advised discontinuation of aspirin to increased risk of fatal GI bleed > 31yrs. Patient felt comfortable staying on low dose and places more emphasis on concern for mi, ischemic stroke, or concern of thromboembolism with COVID. Will continue to discuss ASA 81mg .

## 2020-09-08 NOTE — Assessment & Plan Note (Addendum)
Elevated. Agreeable to starting amlodipine 2.5mg 

## 2020-09-08 NOTE — Patient Instructions (Addendum)
It is imperative that you are seen AT least twice per year for labs and monitoring. Monitor blood pressure at home and me 5-6 reading on separate days. Goal is less than 120/80, based on newest guidelines, however we certainly want to be less than 130/80;  if persistently higher, please make sooner follow up appointment so we can recheck you blood pressure and manage/ adjust medications.   Referral to orthopedic.   As discussed, for now we will continue aspirin 81mg  with close attention to benefit versus risk especially during pandemic.   Benefit being reduced risk of ischemic stroke, non fatal heart attack and colorectal cancer ( if used for 10 years). However these benefits are most apparent in adults aged 59 to 34 years with a ?10% 10-year cardiovasular risk . Persons who are not at increased risk for bleeding, have a life expectancy of at least 10 years, and are willing to take low-dose aspirin daily for at least 10 years are more likely to benefit. Persons who place a higher value on the potential benefits than the potential harms may choose to initiate low-dose aspirin.  At 70 years and above the risk of gastrointestinal bleeding, falls,  hemorrhagic stroke increases therefore becoming a very personal discussion in regards to whether you continue aspirin 81 mg.   For now , we have opted to continue however please bring to my attention at follow so we can continue to have this discussion.

## 2020-09-08 NOTE — Assessment & Plan Note (Signed)
Unchanged. Quite bothersome. Referral to orthopedic for further evaluation.

## 2020-09-08 NOTE — Progress Notes (Signed)
Virtual Visit via Video Note  I connected with@  on 09/08/20 at  2:30 PM EDT by a video enabled telemedicine application and verified that I am speaking with the correct person using two identifiers.  Location patient: home Location provider:work  Persons participating in the virtual visit: patient, provider  I discussed the limitations of evaluation and management by telemedicine and the availability of in person appointments. The patient expressed understanding and agreed to proceed.   HPI: Right hand numbness unchanged and most bothersome. No numbness in arm. Numbness most in 1st, 2nd and 3rd finger. Some numbness in right thumb as well. Didn't start gabapentin. No longer wearing splint.  Right handed.   Mostly around 130-145/ 71-87. Not typically around 120/82.   No cp,sob.   Taking 81mg  asa.  No family h/o colon cancer.  Would like to stay on aspirin during pandemic due to concern for blood clot   ROS: See pertinent positives and negatives per HPI.    EXAM:  VITALS per patient if applicable: BP Readings from Last 3 Encounters:  09/08/20 120/82  07/25/20 140/80  12/10/19 (!) 132/59    GENERAL: alert, oriented, appears well and in no acute distress  HEENT: atraumatic, conjunttiva clear, no obvious abnormalities on inspection of external nose and ears  NECK: normal movements of the head and neck  LUNGS: on inspection no signs of respiratory distress, breathing rate appears normal, no obvious gross SOB, gasping or wheezing  CV: no obvious cyanosis  MS: moves all visible extremities without noticeable abnormality  PSYCH/NEURO: pleasant and cooperative, no obvious depression or anxiety, speech and thought processing grossly intact  ASSESSMENT AND PLAN:  Discussed the following assessment and plan:  Problem List Items Addressed This Visit      Cardiovascular and Mediastinum   HTN (hypertension) - Primary    Elevated. Agreeable to starting amlodipine 2.5mg          Other   Encounter for long-term (current) use of aspirin    Due to age, advised discontinuation of aspirin to increased risk of fatal GI bleed > 54yrs. Patient felt comfortable staying on low dose and places more emphasis on concern for mi, ischemic stroke, or concern of thromboembolism with COVID. Will continue to discuss ASA 81mg .       Numbness and tingling in right hand    Unchanged. Quite bothersome. Referral to orthopedic for further evaluation.       Relevant Orders   Ambulatory referral to Orthopedic Surgery      -we discussed possible serious and likely etiologies, options for evaluation and workup, limitations of telemedicine visit vs in person visit, treatment, treatment risks and precautions. Pt prefers to treat via telemedicine empirically rather then risking or undertaking an in person visit at this moment.  .   I discussed the assessment and treatment plan with the patient. The patient was provided an opportunity to ask questions and all were answered. The patient agreed with the plan and demonstrated an understanding of the instructions.   The patient was advised to call back or seek an in-person evaluation if the symptoms worsen or if the condition fails to improve as anticipated.   Mable Paris, FNP

## 2020-09-12 ENCOUNTER — Telehealth: Payer: Self-pay | Admitting: Family

## 2020-09-12 NOTE — Telephone Encounter (Signed)
Rejection Reason - Patient did not respond - We have contacted this patient 2 times, left 2 messages and mailed a letter. This patient has not contacted [place] back to schedule. Referral is being closed due to time sensitivity but pt has our info to call and schedule. We will still be happy to assist your office and the patient. Thank you!" [facility] [place] said on Sep 12, 2020 9:04 AM

## 2020-09-22 ENCOUNTER — Telehealth: Payer: Self-pay

## 2020-09-22 NOTE — Telephone Encounter (Signed)
Pt would like to inquire about cologuard.

## 2020-09-23 NOTE — Telephone Encounter (Signed)
Called and spoke to [place], Krista Gentry states that she would like for [place] to order the Cologuard. She states that she is due for it and would like it done now.

## 2020-09-24 NOTE — Telephone Encounter (Signed)
Please order cologuard and let pt know once you have done so

## 2020-09-24 NOTE — Telephone Encounter (Signed)
Pt called and made aware that order was placed.

## 2020-10-01 DIAGNOSIS — M65312 Trigger thumb, left thumb: Secondary | ICD-10-CM | POA: Diagnosis not present

## 2020-10-01 DIAGNOSIS — G5603 Carpal tunnel syndrome, bilateral upper limbs: Secondary | ICD-10-CM | POA: Diagnosis not present

## 2020-10-02 DIAGNOSIS — G5603 Carpal tunnel syndrome, bilateral upper limbs: Secondary | ICD-10-CM | POA: Diagnosis not present

## 2020-10-03 ENCOUNTER — Ambulatory Visit (INDEPENDENT_AMBULATORY_CARE_PROVIDER_SITE_OTHER): Payer: Medicare Other

## 2020-10-03 VITALS — Ht 64.0 in | Wt 227.0 lb

## 2020-10-03 DIAGNOSIS — Z0000 Encounter for general adult medical examination without abnormal findings: Secondary | ICD-10-CM

## 2020-10-03 NOTE — Progress Notes (Addendum)
Subjective:   Krista Gentry is a 74 y.o. female who presents for Medicare Annual (Subsequent) preventive examination.  Review of Systems    No ROS.  Medicare Wellness Virtual Visit.    Cardiac Risk Factors include: advanced age (>14men, >69 women)     Objective:    Today's Vitals   10/03/20 0832  Weight: 227 lb (103 kg)  Height: 5\' 4"  (1.626 m)   Body mass index is 38.96 kg/m.  Advanced Directives 10/03/2020 10/03/2019 09/27/2018  Does Patient Have a Medical Advance Directive? Yes Yes Yes  Type of Paramedic of [place];Living will [place];Living will [place]  Does patient want to make changes to medical advance directive? No - Patient declined No - Patient declined No - Patient declined  Copy of [place] in Chart? No - copy requested No - copy requested No - copy requested    Current Medications (verified) Outpatient Encounter Medications as of 10/03/2020  Medication Sig  . amLODipine (NORVASC) 2.5 MG tablet Take 1 tablet (2.5 mg total) by mouth daily.  Marland Kitchen aspirin EC 81 MG tablet Take by mouth.  Marland Kitchen BIOTIN PO Take by mouth.  . cetirizine (ZYRTEC) 10 MG tablet Take 10 mg by mouth daily.  . Cholecalciferol (VITAMIN D) 50 MCG (2000 UT) tablet Take 6,000 Units by mouth daily.  Marland Kitchen CRANBERRY EXTRACT PO Take by mouth daily.  . Menaquinone-7 (VITAMIN K2 PO) Take 600 mcg by mouth daily.  . Multiple Minerals-Vitamins (CITRACAL MAXIMUM PLUS) TABS   . Potassium 99 MG TABS Take 1 tablet by mouth daily.  . Quercetin 250 MG TABS Take 2 tablets by mouth daily. Take two tablets 500mg  by mouth daily.  Marland Kitchen UNABLE TO FIND Take 1 capsule by mouth daily. Beet Root 6000 mg  . Zinc 50 MG CAPS Take by mouth.   No facility-administered encounter medications on file as of 10/03/2020.    Allergies (verified) Patient has no known allergies.   History: Past Medical History:  Diagnosis Date  . Allergy    Past  Surgical History:  Procedure Laterality Date  . ABDOMINAL HYSTERECTOMY    . BILATERAL OOPHORECTOMY    . BREAST BIOPSY    . CHOLECYSTECTOMY  1994   Family History  Problem Relation Age of Onset  . Kidney disease Mother        nephrolithiasis  . Dementia Mother   . Hypertension Mother   . Kidney Stones Mother   . Mental retardation Sister 8       possible autism,  now with depression  . Dementia Sister   . Cancer Maternal Aunt        breast cancer  . Dementia Maternal Aunt   . Mental retardation Maternal Aunt   . Breast cancer Maternal Aunt   . Dementia Maternal Uncle   . Cancer Maternal Grandmother        breast cancer  . Breast cancer Maternal Grandmother   . Heart attack Paternal Grandmother   . Kidney Stones Brother   . Cancer Maternal Grandfather        Stomach cancer  . Colon cancer Neg Hx    Social History   Socioeconomic History  . Marital status: Married    Spouse name: Not on file  . Number of children: Not on file  . Years of education: Not on file  . Highest education level: Not on file  Occupational History  . Not on file  Tobacco Use  . Smoking status: Former Smoker    Packs/day: 1.00    Years: 5.00    Pack years: 5.00    Types: Cigarettes    Quit date: 04/14/1970    Years since quitting: 50.5  . Smokeless tobacco: Never Used  Vaping Use  . Vaping Use: Never used  Substance and Sexual Activity  . Alcohol use: Yes    Alcohol/week: 6.0 standard drinks    Types: 2 Glasses of wine, 1 Cans of beer, 3 Shots of liquor per week    Comment: occassion  . Drug use: No  . Sexual activity: Not on file  Other Topics Concern  . Not on file  Social History Narrative  . Not on file   Social Determinants of Health   Financial Resource Strain: Low Risk   . Difficulty of Paying Living Expenses: Not hard at all  Food Insecurity: No Food Insecurity  . Worried About Charity fundraiser in the Last Year: Never true  . Ran Out of Food in the Last Year: Never  true  Transportation Needs: No Transportation Needs  . Lack of Transportation (Medical): No  . Lack of Transportation (Non-Medical): No  Physical Activity:   . Days of Exercise per Week: Not on file  . Minutes of Exercise per Session: Not on file  Stress: No Stress Concern Present  . Feeling of Stress : Not at all  Social Connections:   . Frequency of Communication with Friends and Family: Not on file  . Frequency of Social Gatherings with Friends and Family: Not on file  . Attends Religious Services: Not on file  . Active Member of Clubs or Organizations: Not on file  . Attends Archivist Meetings: Not on file  . Marital Status: Not on file    Tobacco Counseling Counseling given: Not Answered   Clinical Intake:                       Activities of Daily Living In your present state of health, do you have any difficulty performing the following activities: 10/03/2020  Hearing? N  Vision? N  Difficulty concentrating or making decisions? N  Walking or climbing stairs? N  Dressing or bathing? N  Doing errands, shopping? N  Preparing Food and eating ? N  Using the Toilet? N  In the past six months, have you accidently leaked urine? N  Do you have problems with loss of bowel control? N  Managing your Medications? N  Managing your Finances? N  Housekeeping or managing your Housekeeping? N  Some recent data might be hidden    Patient Care Team: Burnard Hawthorne, FNP as PCP - General (Family Medicine)  Indicate any recent Medical Services you may have received from other than [facility] providers in the past year (date may be approximate).     Assessment:   This is a routine wellness examination for [place].  I connected with  today by telephone and verified that I am speaking with the correct person using two identifiers. Location patient: home Location provider: work Persons participating in the virtual visit: patient, Marine scientist.    I discussed the  limitations, risks, security and privacy concerns of performing an evaluation and management service by telephone and the availability of in person appointments. The patient expressed understanding and verbally consented to this telephonic visit.    Interactive audio and video telecommunications were attempted between this provider and patient, however failed, due to  patient having technical difficulties OR patient did not have access to video capability.  We continued and completed visit with audio only.  Some vital signs may be absent or patient reported.   Hearing/Vision screen  Hearing Screening   125Hz  250Hz  500Hz  1000Hz  2000Hz  3000Hz  4000Hz  6000Hz  8000Hz   Right ear:           Left ear:           Comments: Patient is able to hear conversational tones without difficulty. No issues reported.  Vision Screening Comments: Wears corrective lenses  Visual acuity not assessed, virtual visit. They have seen their ophthalmologist in the last 12 months.   Dietary issues and exercise activities discussed: Current Exercise Habits: Home exercise routine, Type of exercise: calisthenics, Time (Minutes): 30, Frequency (Times/Week): 5, Weekly Exercise (Minutes/Week): 150, Intensity: Mild  Regular diet Good water intake  Goals      Patient Stated   .  DIET - INCREASE WATER INTAKE (pt-stated)      Depression Screen PHQ 2/9 Scores 10/03/2020 10/03/2019 09/27/2018 09/29/2017 07/26/2017 12/15/2016 09/18/2013  PHQ - 2 Score 0 0 0 0 0 0 0    Fall Risk Fall Risk  10/03/2020 01/23/2020 12/10/2019 10/03/2019 09/27/2018  Falls in the past year? 0 0 0 0 No  Number falls in past yr: 0 - - 0 -  Injury with Fall? - - - - -  Comment - - - - -  Risk Factor Category  - - - - -  Risk for fall due to : - - - - -  Risk for fall due to: Comment - - - - -  Follow up Falls evaluation completed Falls evaluation completed Falls evaluation completed - -   Handrails in use when climbing stairs? Yes Home free of loose  throw rugs in walkways, pet beds, electrical cords, etc? Yes  Adequate lighting in your home to reduce risk of falls? Yes   ASSISTIVE DEVICES UTILIZED TO PREVENT FALLS: Use of a cane, walker or w/c? No   TIMED UP AND GO: Was the test performed? No .  Virtual visit.   Cognitive Function: Patient is alert and oriented x3.  Denies difficulty focusing, making decisions, memory loss.  Enjoys words with friends and other brain challenging activities.   MMSE - Mini Mental State Exam 09/27/2018  Orientation to time 5  Orientation to Place 5  Registration 3  Attention/ Calculation 5  Recall 3  Language- name 2 objects 2  Language- repeat 1  Language- follow 3 step command 3  Language- read & follow direction 1  Write a sentence 1  Copy design 1  Total score 30     6CIT Screen 10/03/2019  What Year? 0 points  What month? 0 points  What time? 0 points  Count back from 20 0 points  Months in reverse 0 points  Repeat phrase 0 points  Total Score 0   Immunizations Immunization History  Administered Date(s) Administered  . Influenza Split 10/05/2014  . Influenza, High Dose Seasonal PF 10/23/2018  . Influenza-Unspecified 09/06/2013  . PPD Test 08/15/2013  . Pneumococcal Conjugate-13 08/03/2017  . Pneumococcal Polysaccharide-23 08/15/2013  . Tdap 09/06/2013   Health Maintenance Health Maintenance  Topic Date Due  . Fecal DNA (Cologuard)  09/27/2020  . INFLUENZA VACCINE  10/25/2020 (Originally 07/20/2020)  . MAMMOGRAM  10/10/2021  . TETANUS/TDAP  09/07/2023  . DEXA SCAN  Completed  . Hepatitis C Screening  Completed  . PNA vac Low Risk Adult  Completed  . COVID-19 Vaccine  Discontinued   Dental Screening: Recommended annual dental exams for proper oral hygiene.  Community Resource Referral / Chronic Care Management: CRR required this visit?  No   CCM required this visit?  No      Plan:   Keep all routine maintenance appointments.   I have personally reviewed and  noted the following in the patient's chart:   . Medical and social history . Use of alcohol, tobacco or illicit drugs  . Current medications and supplements . Functional ability and status . Nutritional status . Physical activity . Advanced directives . List of other physicians . Hospitalizations, surgeries, and ER visits in previous 12 months . Vitals . Screenings to include cognitive, depression, and falls . Referrals and appointments  In addition, I have reviewed and discussed with patient certain preventive protocols, quality metrics, and best practice recommendations. A written personalized care plan for preventive services as well as general preventive health recommendations were provided to patient via mychart.     Varney Biles, LPN   [id number]/[id number]     Agree with plan. Mable Paris, NP

## 2020-10-03 NOTE — Patient Instructions (Addendum)
Krista Gentry , Thank you for taking time to come for your Medicare Wellness Visit. I appreciate your ongoing commitment to your health goals. Please review the following plan we discussed and let me know if I can assist you in the future.   These are the goals we discussed: Goals      Patient Stated   .  DIET - INCREASE WATER INTAKE (pt-stated)       This is a list of the screening recommended for you and due dates:  Health Maintenance  Topic Date Due  . Cologuard (Stool DNA test)  09/27/2020  . Flu Shot  10/25/2020*  . Mammogram  10/10/2021  . Tetanus Vaccine  09/07/2023  . DEXA scan (bone density measurement)  Completed  .  Hepatitis C: One time screening is recommended by Center for Disease Control  (CDC) for  adults born from [birth year] through 1965.   Completed  . Pneumonia vaccines  Completed  . COVID-19 Vaccine  Discontinued  *Topic was postponed. The date shown is not the original due date.    Immunizations Immunization History  Administered Date(s) Administered  . Influenza Split 10/05/2014  . Influenza, High Dose Seasonal PF 10/23/2018  . Influenza-Unspecified 09/06/2013  . PPD Test 08/15/2013  . Pneumococcal Conjugate-13 08/03/2017  . Pneumococcal Polysaccharide-23 08/15/2013  . Tdap 09/06/2013   Advanced directives: End of life planning; Advance aging; Advanced directives discussed.  Copy of current HCPOA/Living Will requested.    Conditions/risks identified: none new  Follow up in one year for your annual wellness visit.   Preventive Care 58 Years and Older, Female Preventive care refers to lifestyle choices and visits with your health care provider that can promote health and wellness. What does preventive care include?  A yearly physical exam. This is also called an annual well check.  Dental exams once or twice a year.  Routine eye exams. Ask your health care provider how often you should have your eyes checked.  Personal lifestyle choices,  including:  Daily care of your teeth and gums.  Regular physical activity.  Eating a healthy diet.  Avoiding tobacco and drug use.  Limiting alcohol use.  Practicing safe sex.  Taking low-dose aspirin every day.  Taking vitamin and mineral supplements as recommended by your health care provider. What happens during an annual well check? The services and screenings done by your health care provider during your annual well check will depend on your age, overall health, lifestyle risk factors, and family history of disease. Counseling  Your health care provider may ask you questions about your:  Alcohol use.  Tobacco use.  Drug use.  Emotional well-being.  Home and relationship well-being.  Sexual activity.  Eating habits.  History of falls.  Memory and ability to understand (cognition).  Work and work Statistician.  Reproductive health. Screening  You may have the following tests or measurements:  Height, weight, and BMI.  Blood pressure.  Lipid and cholesterol levels. These may be checked every 5 years, or more frequently if you are over 10 years old.  Skin check.  Lung cancer screening. You may have this screening every year starting at age 44 if you have a 30-pack-year history of smoking and currently smoke or have quit within the past 15 years.  Fecal occult blood test (FOBT) of the stool. You may have this test every year starting at age 70.  Flexible sigmoidoscopy or colonoscopy. You may have a sigmoidoscopy every 5 years or a colonoscopy every 10 years  starting at age 4.  Hepatitis C blood test.  Hepatitis B blood test.  Sexually transmitted disease (STD) testing.  Diabetes screening. This is done by checking your blood sugar (glucose) after you have not eaten for a while (fasting). You may have this done every 1-3 years.  Bone density scan. This is done to screen for osteoporosis. You may have this done starting at age 66.  Mammogram. This  may be done every 1-2 years. Talk to your health care provider about how often you should have regular mammograms. Talk with your health care provider about your test results, treatment options, and if necessary, the need for more tests. Vaccines  Your health care provider may recommend certain vaccines, such as:  Influenza vaccine. This is recommended every year.  Tetanus, diphtheria, and acellular pertussis (Tdap, Td) vaccine. You may need a Td booster every 10 years.  Zoster vaccine. You may need this after age 60.  Pneumococcal 13-valent conjugate (PCV13) vaccine. One dose is recommended after age 29.  Pneumococcal polysaccharide (PPSV23) vaccine. One dose is recommended after age 53. Talk to your health care provider about which screenings and vaccines you need and how often you need them. This information is not intended to replace advice given to you by your health care provider. Make sure you discuss any questions you have with your health care provider. Document Released: 01/02/2016 Document Revised: 08/25/2016 Document Reviewed: 10/07/2015 Elsevier Interactive Patient Education  2017 [employer] Prevention in the Home Falls can cause injuries. They can happen to people of all ages. There are many things you can do to make your home safe and to help prevent falls. What can I do on the outside of my home?  Regularly fix the edges of walkways and driveways and fix any cracks.  Remove anything that might make you trip as you walk through a door, such as a raised step or threshold.  Trim any bushes or trees on the path to your home.  Use bright outdoor lighting.  Clear any walking paths of anything that might make someone trip, such as rocks or tools.  Regularly check to see if handrails are loose or broken. Make sure that both sides of any steps have handrails.  Any raised decks and porches should have guardrails on the edges.  Have any leaves, snow, or ice cleared  regularly.  Use sand or salt on walking paths during winter.  Clean up any spills in your garage right away. This includes oil or grease spills. What can I do in the bathroom?  Use night lights.  Install grab bars by the toilet and in the tub and shower. Do not use towel bars as grab bars.  Use non-skid mats or decals in the tub or shower.  If you need to sit down in the shower, use a plastic, non-slip stool.  Keep the floor dry. Clean up any water that spills on the floor as soon as it happens.  Remove soap buildup in the tub or shower regularly.  Attach bath mats securely with double-sided non-slip rug tape.  Do not have throw rugs and other things on the floor that can make you trip. What can I do in the bedroom?  Use night lights.  Make sure that you have a light by your bed that is easy to reach.  Do not use any sheets or blankets that are too big for your bed. They should not hang down onto the floor.  Have a  firm chair that has side arms. You can use this for support while you get dressed.  Do not have throw rugs and other things on the floor that can make you trip. What can I do in the kitchen?  Clean up any spills right away.  Avoid walking on wet floors.  Keep items that you use a lot in easy-to-reach places.  If you need to reach something above you, use a strong step stool that has a grab bar.  Keep electrical cords out of the way.  Do not use floor polish or wax that makes floors slippery. If you must use wax, use non-skid floor wax.  Do not have throw rugs and other things on the floor that can make you trip. What can I do with my stairs?  Do not leave any items on the stairs.  Make sure that there are handrails on both sides of the stairs and use them. Fix handrails that are broken or loose. Make sure that handrails are as long as the stairways.  Check any carpeting to make sure that it is firmly attached to the stairs. Fix any carpet that is loose  or worn.  Avoid having throw rugs at the top or bottom of the stairs. If you do have throw rugs, attach them to the floor with carpet tape.  Make sure that you have a light switch at the top of the stairs and the bottom of the stairs. If you do not have them, ask someone to add them for you. What else can I do to help prevent falls?  Wear shoes that:  Do not have high heels.  Have rubber bottoms.  Are comfortable and fit you well.  Are closed at the toe. Do not wear sandals.  If you use a stepladder:  Make sure that it is fully opened. Do not climb a closed stepladder.  Make sure that both sides of the stepladder are locked into place.  Ask someone to hold it for you, if possible.  Clearly mark and make sure that you can see:  Any grab bars or handrails.  First and last steps.  Where the edge of each step is.  Use tools that help you move around (mobility aids) if they are needed. These include:  Canes.  Walkers.  Scooters.  Crutches.  Turn on the lights when you go into a dark area. Replace any light bulbs as soon as they burn out.  Set up your furniture so you have a clear path. Avoid moving your furniture around.  If any of your floors are uneven, fix them.  If there are any pets around you, be aware of where they are.  Review your medicines with your doctor. Some medicines can make you feel dizzy. This can increase your chance of falling. Ask your doctor what other things that you can do to help prevent falls. This information is not intended to replace advice given to you by your health care provider. Make sure you discuss any questions you have with your health care provider. Document Released: 10/02/2009 Document Revised: 05/13/2016 Document Reviewed: 01/10/2015 Elsevier Interactive Patient Education  2017 [employer].

## 2020-10-06 DIAGNOSIS — M65312 Trigger thumb, left thumb: Secondary | ICD-10-CM | POA: Diagnosis not present

## 2020-10-06 DIAGNOSIS — G5603 Carpal tunnel syndrome, bilateral upper limbs: Secondary | ICD-10-CM | POA: Diagnosis not present

## 2020-10-08 ENCOUNTER — Other Ambulatory Visit: Payer: Self-pay | Admitting: Family

## 2020-10-08 ENCOUNTER — Other Ambulatory Visit: Payer: Self-pay

## 2020-10-08 ENCOUNTER — Ambulatory Visit: Payer: Medicare Other | Admitting: Dermatology

## 2020-10-08 DIAGNOSIS — L309 Dermatitis, unspecified: Secondary | ICD-10-CM

## 2020-10-08 DIAGNOSIS — L821 Other seborrheic keratosis: Secondary | ICD-10-CM

## 2020-10-08 DIAGNOSIS — L719 Rosacea, unspecified: Secondary | ICD-10-CM

## 2020-10-08 DIAGNOSIS — C44712 Basal cell carcinoma of skin of right lower limb, including hip: Secondary | ICD-10-CM

## 2020-10-08 DIAGNOSIS — D485 Neoplasm of uncertain behavior of skin: Secondary | ICD-10-CM

## 2020-10-08 DIAGNOSIS — Z1283 Encounter for screening for malignant neoplasm of skin: Secondary | ICD-10-CM | POA: Diagnosis not present

## 2020-10-08 DIAGNOSIS — C4491 Basal cell carcinoma of skin, unspecified: Secondary | ICD-10-CM

## 2020-10-08 DIAGNOSIS — D2372 Other benign neoplasm of skin of left lower limb, including hip: Secondary | ICD-10-CM | POA: Diagnosis not present

## 2020-10-08 DIAGNOSIS — D1800 Hemangioma unspecified site: Secondary | ICD-10-CM

## 2020-10-08 DIAGNOSIS — Z1231 Encounter for screening mammogram for malignant neoplasm of breast: Secondary | ICD-10-CM

## 2020-10-08 DIAGNOSIS — R202 Paresthesia of skin: Secondary | ICD-10-CM

## 2020-10-08 DIAGNOSIS — L814 Other melanin hyperpigmentation: Secondary | ICD-10-CM

## 2020-10-08 DIAGNOSIS — D239 Other benign neoplasm of skin, unspecified: Secondary | ICD-10-CM

## 2020-10-08 DIAGNOSIS — D229 Melanocytic nevi, unspecified: Secondary | ICD-10-CM

## 2020-10-08 DIAGNOSIS — L738 Other specified follicular disorders: Secondary | ICD-10-CM

## 2020-10-08 DIAGNOSIS — L578 Other skin changes due to chronic exposure to nonionizing radiation: Secondary | ICD-10-CM

## 2020-10-08 HISTORY — DX: Basal cell carcinoma of skin, unspecified: C44.91

## 2020-10-08 MED ORDER — TRIAMCINOLONE ACETONIDE 0.1 % EX CREA: TOPICAL_CREAM | CUTANEOUS | 1 refills | Status: DC

## 2020-10-08 MED ORDER — MUPIROCIN 2 % EX OINT: 1.0000 "application " | TOPICAL_OINTMENT | Freq: Every day | CUTANEOUS | 0 refills | Status: DC

## 2020-10-08 NOTE — Progress Notes (Signed)
New Patient Visit  Subjective  Krista Gentry is a 74 y.o. female who presents for the following: FBSE.  Patient here for full body skin exam and skin cancer screening. She has no h/o skin cancer. There are some spots between breasts she would like checked and has some itching at the back.  Patient concerned about all of the sun exposure that she has had.   The following portions of the chart were reviewed this encounter and updated as appropriate:  Tobacco  Allergies  Meds  Problems  Med Hx  Surg Hx  Fam Hx      Review of Systems:  No other skin or systemic complaints except as noted in HPI or Assessment and Plan.  Objective  Well appearing patient in no apparent distress; mood and affect are within normal limits.  A full examination was performed including scalp, head, eyes, ears, nose, lips, neck, chest, axillae, abdomen, back, buttocks, bilateral upper extremities, bilateral lower extremities, hands, feet, fingers, toes, fingernails, and toenails. All findings within normal limits unless otherwise noted below.  Objective  Left lateral calf: Firm pink/brown papulenodule with dimple sign.   Objective  Right lateral calf: 0.8cm scaly pink papule R/o SCC       Objective  Face: Mild erythema mid face  Objective  Face: Yellow papules  Objective  Right Upper Back: Hyperpigmented patch  Objective  Right Upper Back: Erythematous excoriated patch   Assessment & Plan  Dermatofibroma Left lateral calf  Benign-appearing.  Observation.  Call clinic for new or changing lesions.  Recommend daily use of broad spectrum spf 30+ sunscreen to sun-exposed areas.    Neoplasm of uncertain behavior of skin Right lateral calf  Skin / nail biopsy Type of biopsy: tangential   Informed consent: discussed and consent obtained   Timeout: patient name, date of birth, surgical site, and procedure verified   Patient was prepped and draped in usual sterile fashion: Area  prepped with isopropyl alcohol. Anesthesia: the lesion was anesthetized in a standard fashion   Anesthetic:  1% lidocaine w/ epinephrine 1-100,000 buffered w/ 8.4% NaHCO3 Instrument used: flexible razor blade   Hemostasis achieved with: aluminum chloride   Outcome: patient tolerated procedure well   Post-procedure details: wound care instructions given   Additional details:  Mupirocin and a bandage applied  Specimen 1 - Surgical pathology Differential Diagnosis: R/o SCC Check Margins: No 0.8cm scaly pink papule   Start mupirocin daily  Ordered Medications: mupirocin ointment (BACTROBAN) 2 %  Rosacea Face  No grittiness or irritation of the eyes per patient. Treatment deferred.  Sebaceous hyperplasia Face  Benign-appearing.  Observation.  Call clinic for new or changing lesions.  Recommend daily use of broad spectrum spf 30+ sunscreen to sun-exposed areas.    Notalgia paresthetica Right Upper Back  Recommend Gold Bond Rapid Relief Anti-Itch cream up to 3 times per day to areas that are itchy. May also try Capsaicin 5-6 times daily for a month then decrease. Can mix with Vaseline if burns.   Ordered Medications: triamcinolone cream (KENALOG) 0.1 %  Dermatitis Right Upper Back  Start TMC 0.1% cream 1-2 times daily up to 2 weeks at a time as needed for itch. Avoid applying to face, groin, and axilla. Use as directed. Risk of skin atrophy with long-term use reviewed.   Topical steroids (such as triamcinolone, fluocinolone, fluocinonide, mometasone, clobetasol, halobetasol, betamethasone, hydrocortisone) can cause thinning and lightening of the skin if they are used for too long in the same  area. Your physician has selected the right strength medicine for your problem and area affected on the body. Please use your medication only as directed by your physician to prevent side effects.     Lentigines - Scattered tan macules - Discussed due to sun exposure - Benign,  observe - Call for any changes  Seborrheic Keratoses - Stuck-on, waxy, tan-brown papules and plaques  - Discussed benign etiology and prognosis. - Observe - Call for any changes  Melanocytic Nevi - Tan-brown and/or pink-flesh-colored symmetric macules and papules - Benign appearing on exam today - Observation - Call clinic for new or changing moles - Recommend daily use of broad spectrum spf 30+ sunscreen to sun-exposed areas.   Hemangiomas - Red papules - Discussed benign nature - Observe - Call for any changes  Actinic Damage - diffuse scaly erythematous macules with underlying dyspigmentation - Recommend daily broad spectrum sunscreen SPF 30+ to sun-exposed areas, reapply every 2 hours as needed.  - Call for new or changing lesions.  Skin cancer screening performed today.  Return in about 1 year (around 10/08/2021) for TBSE.  Graciella Belton, RMA, am acting as scribe for [name], MD .  Documentation: I have reviewed the above documentation for accuracy and completeness, and I agree with the above.  [name], MD

## 2020-10-08 NOTE — Patient Instructions (Addendum)
Melanoma ABCDEs  Melanoma is the most dangerous type of skin cancer, and is the leading cause of death from skin disease.  You are more likely to develop melanoma if you:  Have light-colored skin, light-colored eyes, or red or blond hair  Spend a lot of time in the sun  Tan regularly, either outdoors or in a tanning bed  Have had blistering sunburns, especially during childhood  Have a close family member who has had a melanoma  Have atypical moles or large birthmarks  Early detection of melanoma is key since treatment is typically straightforward and cure rates are extremely high if we catch it early.   The first sign of melanoma is often a change in a mole or a new dark spot.  The ABCDE system is a way of remembering the signs of melanoma.  A for asymmetry:  The two halves do not match. B for border:  The edges of the growth are irregular. C for color:  A mixture of colors are present instead of an even brown color. D for diameter:  Melanomas are usually (but not always) greater than 6mm - the size of a pencil eraser. E for evolution:  The spot keeps changing in size, shape,   and color.  Please check your skin once per month between visits. You can use a small mirror in front and a large mirror behind you to keep an eye on the back side or your body.   If you see any new or changing lesions before your next follow-up, please call to schedule a visit.  Please continue daily skin protection including broad spectrum sunscreen SPF 30+ to sun-exposed areas, reapplying every 2 hours as needed when you're outdoors.      Recommend taking Heliocare sun protection supplement daily in sunny weather for additional sun protection. For maximum protection on the sunniest days, you can take up to 2 capsules of regular Heliocare OR take 1 capsule of Heliocare Ultra. For prolonged exposure (such as a full day in the sun), you can repeat your dose of the supplement 4 hours after your first  dose. Heliocare can be purchased at [facility] or at [email or web address].      Wound Care Instructions  1. Cleanse wound gently with soap and water once a day then pat dry with clean gauze. Apply a thing coat of Petrolatum (petroleum jelly, "Vaseline") over the wound (unless you have an allergy to this). We recommend that you use a new, sterile tube of Vaseline. Do not pick or remove scabs. Do not remove the yellow or white "healing tissue" from the base of the wound.  2. Cover the wound with fresh, clean, nonstick gauze and secure with paper tape. You may use Band-Aids in place of gauze and tape if the would is small enough, but would recommend trimming much of the tape off as there is often too much. Sometimes Band-Aids can irritate the skin.  3. You should call the office for your biopsy report after 1 week if you have not already been contacted.  4. If you experience any problems, such as abnormal amounts of bleeding, swelling, significant bruising, significant pain, or evidence of infection, please call the office immediately.  5. FOR ADULT SURGERY PATIENTS: If you need something for pain relief you may take 1 extra strength Tylenol (acetaminophen) AND 2 Ibuprofen (200mg each) together every 4 hours as needed for pain. (do not take these if you are allergic to them or if you have   you may only need pain medication for 1 to 3 days.   For itch at back - Recommend Gold Bond Rapid Relief Anti-Itch cream up to 3 times per day to areas that are itchy. May also try Capsaicin 5-6 times daily for a month then decrease. Can mix with Vaseline if burns.  Start triamcinolone 0.1% cream 1-2 times daily up to 2 weeks at a time as needed for itch. Avoid applying to face, groin, and axilla. Use as directed. Risk of skin atrophy with long-term use reviewed.

## 2020-10-13 ENCOUNTER — Encounter: Payer: Self-pay | Admitting: Family

## 2020-10-13 DIAGNOSIS — Z1211 Encounter for screening for malignant neoplasm of colon: Secondary | ICD-10-CM | POA: Diagnosis not present

## 2020-10-13 LAB — COLOGUARD: Cologuard: NEGATIVE

## 2020-10-14 ENCOUNTER — Other Ambulatory Visit: Payer: Self-pay | Admitting: Orthopedic Surgery

## 2020-10-14 NOTE — Progress Notes (Signed)
Skin , right lateral calf BASAL CELL CARCINOMA, NODULAR PATTERN --> excision vs ED&C; Pt prefers ED&C  Dr.  Jerilynn Mages discussed with pt 10/14/20 at 11:50 AM.  MAs please call and schedule for [facility] in mid to late December (after she's healed from [facility] and before end of year) and FBSE in 6 months

## 2020-10-15 ENCOUNTER — Encounter: Payer: Self-pay | Admitting: Family

## 2020-10-15 ENCOUNTER — Encounter: Payer: Self-pay | Admitting: Dermatology

## 2020-10-15 ENCOUNTER — Other Ambulatory Visit: Payer: Self-pay

## 2020-10-15 ENCOUNTER — Encounter
Admission: RE | Admit: 2020-10-15 | Discharge: 2020-10-15 | Disposition: A | Discharge status: Home / Self Care | Payer: Medicare Other | Source: Ambulatory Visit | Attending: Orthopedic Surgery | Admitting: Orthopedic Surgery

## 2020-10-15 ENCOUNTER — Telehealth: Payer: Self-pay

## 2020-10-15 DIAGNOSIS — C4491 Basal cell carcinoma of skin, unspecified: Secondary | ICD-10-CM | POA: Insufficient documentation

## 2020-10-15 HISTORY — DX: Family history of other specified conditions: Z84.89

## 2020-10-15 NOTE — Telephone Encounter (Signed)
-----   Message from [place], MD sent at 10/14/2020 11:56 AM EDT ----- Skin , right lateral calf BASAL CELL CARCINOMA, NODULAR PATTERN --> excision vs ED&C; Pt prefers [facility]  Dr.  Jerilynn Mages discussed with pt 10/14/20 at 11:50 AM.  MAs please call and schedule for [facility] in mid to late December (after she's healed from [facility] and before end of year) and FBSE in 6 months

## 2020-10-15 NOTE — Patient Instructions (Addendum)
Your procedure is scheduled on: Thursday October 23, 2020. Report to Day Surgery inside [facility] 2nd floor. To find out your arrival time please call [phone] between 1PM - 3PM on Wednesday October 22, 2020.  Remember: Instructions that are not followed completely may result in serious medical risk,  up to and including death, or upon the discretion of your surgeon and anesthesiologist your  surgery may need to be rescheduled.     _X__ 1. Do not eat food after midnight the night before your procedure.                 No chewing gum or hard candies. You may drink clear liquids up to 2 hours                 before you are scheduled to arrive for your surgery- DO not drink clear                 liquids within 2 hours of the start of your surgery.                 Clear Liquids include:  water, apple juice without pulp, clear Gatorade, G2 or                  Gatorade Zero (avoid Red/Purple/Blue), Black Coffee or Tea (Do not add                 anything to coffee or tea).   __X__2.  On the morning of surgery brush your teeth with toothpaste and water, you                may rinse your mouth with mouthwash if you wish.  Do not swallow any toothpaste of mouthwash.     _X__ 3.  No Alcohol for 24 hours before or after surgery.    _X__ 4.  Do Not Smoke or use e-cigarettes For 24 Hours Prior to Your Surgery.                 Do not use any chewable tobacco products for at least 6 hours prior to                 Surgery.  _X__  5.  Do not use any recreational drugs (marijuana, cocaine, heroin, ecstasy, MDMA or other)                For at least one week prior to your surgery.  Combination of these drugs with anesthesia                May have life threatening results.   __x__ 6.  Notify your doctor if there is any change in your medical condition      (cold, fever, infections).     Do not wear jewelry, make-up, hairpins, clips or nail polish. Do not wear lotions,  powders, or perfumes. You may wear deodorant. Do not shave 48 hours prior to surgery. Men may shave face and neck. Do not bring valuables to the hospital.    [facility] is not responsible for any belongings or valuables.  Contacts, dentures or bridgework may not be worn into surgery. Leave your suitcase in the car. After surgery it may be brought to your room. For patients admitted to the hospital, discharge time is determined by your treatment team.   Patients discharged the day of surgery will not be allowed to drive home.   Make arrangements for  someone to be with you for the first 24 hours of your Same Day Discharge.   __X__ Take these medicines the morning of surgery with A SIP OF WATER:    1. cetirizine (ZYRTEC) 10 MG    ____ Fleet Enema (as directed)   __x__ Use CHG Soap (or wipes) as directed  ____ Use Benzoyl Peroxide Gel as instructed  ____ Use inhalers on the day of surgery  ____ Stop metformin 2 days prior to surgery    ____ Take 1/2 of usual insulin dose the night before surgery. No insulin the morning          of surgery.   ____ Stop Coumadin/Plavix/aspirin   __x__ Stop Anti-inflammatories such as Ibuprofen, Aleve, Advil, naproxen, aspirin and or BC powders.    __x__ Stop supplements until after surgery.  Barberry-Oreg Grape-Goldenseal (BERBERINE COMPLEX, CRANBERRY EXTRACT, melatonin 5 MG, Quercetin 250 MG, TURMERIC-GINGER   __x__ Do not start any herbal supplements before your procedure.   If you have any questions regarding your pre-procedure instructions,  Please call Pre-admit Testing at [phone].

## 2020-10-15 NOTE — Telephone Encounter (Signed)
Patient advised and scheduled for 12/03/20 at 3pm.

## 2020-10-16 ENCOUNTER — Encounter
Admission: RE | Admit: 2020-10-16 | Discharge: 2020-10-16 | Disposition: A | Discharge status: Home / Self Care | Payer: Medicare Other | Source: Ambulatory Visit | Attending: Orthopedic Surgery | Admitting: Orthopedic Surgery

## 2020-10-16 DIAGNOSIS — Z01812 Encounter for preprocedural laboratory examination: Secondary | ICD-10-CM | POA: Insufficient documentation

## 2020-10-16 DIAGNOSIS — Z01810 Encounter for preprocedural cardiovascular examination: Secondary | ICD-10-CM | POA: Diagnosis not present

## 2020-10-16 LAB — BASIC METABOLIC PANEL
Anion gap: 9 (ref 5–15)
BUN: 17 mg/dL (ref 8–23)
CO2: 28 mmol/L (ref 22–32)
Calcium: 9.3 mg/dL (ref 8.9–10.3)
Chloride: 104 mmol/L (ref 98–111)
Creatinine, Ser: 0.83 mg/dL (ref 0.44–1.00)
GFR, Estimated: >60 mL/min (ref >60)
Glucose, Bld: 94 mg/dL (ref 70–99)
Potassium: 4 mmol/L (ref 3.5–5.1)
Sodium: 141 mmol/L (ref 135–145)

## 2020-10-16 LAB — CBC WITH DIFFERENTIAL/PLATELET
Abs Immature Granulocytes: 0.04 10*3/uL (ref 0.00–0.07)
Basophils Absolute: 0.1 10*3/uL (ref 0.0–0.1)
Basophils Relative: 1 %
Eosinophils Absolute: 0.2 10*3/uL (ref 0.0–0.5)
Eosinophils Relative: 3 %
HCT: 42.2 % (ref 36.0–46.0)
Hemoglobin: 13.3 g/dL (ref 12.0–15.0)
Immature Granulocytes: 1 %
Lymphocytes Relative: 21 %
Lymphs Abs: 1.6 10*3/uL (ref 0.7–4.0)
MCH: 27.4 pg (ref 26.0–34.0)
MCHC: 31.5 g/dL (ref 30.0–36.0)
MCV: 86.8 fL (ref 80.0–100.0)
Monocytes Absolute: 0.7 10*3/uL (ref 0.1–1.0)
Monocytes Relative: 9 %
Neutro Abs: 4.8 10*3/uL (ref 1.7–7.7)
Neutrophils Relative %: 65 %
Platelets: 219 10*3/uL (ref 150–400)
RBC: 4.86 MIL/uL (ref 3.87–5.11)
RDW: 14.6 % (ref 11.5–15.5)
WBC: 7.4 10*3/uL (ref 4.0–10.5)
nRBC: 0 % (ref 0.0–0.2)

## 2020-10-16 LAB — PROTIME-INR
INR: 0.9 (ref 0.8–1.2)
Prothrombin Time: 11.9 seconds (ref 11.4–15.2)

## 2020-10-16 LAB — APTT: aPTT: 28 seconds (ref 24–36)

## 2020-10-19 DIAGNOSIS — M65319 Trigger thumb, unspecified thumb: Secondary | ICD-10-CM | POA: Insufficient documentation

## 2020-10-21 ENCOUNTER — Other Ambulatory Visit
Admission: RE | Admit: 2020-10-21 | Discharge: 2020-10-21 | Disposition: A | Discharge status: Home / Self Care | Payer: Medicare Other | Source: Ambulatory Visit | Attending: Orthopedic Surgery | Admitting: Orthopedic Surgery

## 2020-10-21 ENCOUNTER — Other Ambulatory Visit: Payer: Self-pay

## 2020-10-21 DIAGNOSIS — Z01812 Encounter for preprocedural laboratory examination: Secondary | ICD-10-CM | POA: Insufficient documentation

## 2020-10-21 DIAGNOSIS — Z20822 Contact with and (suspected) exposure to covid-19: Secondary | ICD-10-CM | POA: Insufficient documentation

## 2020-10-21 LAB — COLOGUARD
COLOGUARD: NEGATIVE
Cologuard: NEGATIVE

## 2020-10-21 LAB — SARS CORONAVIRUS 2 (TAT 6-24 HRS): SARS Coronavirus 2: NEGATIVE

## 2020-10-21 LAB — EXTERNAL GENERIC LAB PROCEDURE: COLOGUARD: NEGATIVE

## 2020-10-23 ENCOUNTER — Ambulatory Visit: Payer: Medicare Other | Admitting: Urgent Care

## 2020-10-23 ENCOUNTER — Encounter: Payer: Self-pay | Admitting: Orthopedic Surgery

## 2020-10-23 ENCOUNTER — Encounter
Admission: RE | Disposition: A | Discharge status: Home / Self Care | Payer: Self-pay | Source: Home / Self Care | Attending: Orthopedic Surgery

## 2020-10-23 ENCOUNTER — Other Ambulatory Visit: Payer: Self-pay

## 2020-10-23 ENCOUNTER — Ambulatory Visit
Admission: RE | Admit: 2020-10-23 | Discharge: 2020-10-23 | Disposition: A | Discharge status: Home / Self Care | Payer: Medicare Other | Attending: Orthopedic Surgery | Admitting: Orthopedic Surgery

## 2020-10-23 DIAGNOSIS — Z79899 Other long term (current) drug therapy: Secondary | ICD-10-CM | POA: Diagnosis not present

## 2020-10-23 DIAGNOSIS — G5601 Carpal tunnel syndrome, right upper limb: Secondary | ICD-10-CM | POA: Diagnosis not present

## 2020-10-23 DIAGNOSIS — Z841 Family history of disorders of kidney and ureter: Secondary | ICD-10-CM | POA: Insufficient documentation

## 2020-10-23 DIAGNOSIS — Z87891 Personal history of nicotine dependence: Secondary | ICD-10-CM | POA: Insufficient documentation

## 2020-10-23 DIAGNOSIS — Z810 Family history of intellectual disabilities: Secondary | ICD-10-CM | POA: Diagnosis not present

## 2020-10-23 DIAGNOSIS — Z803 Family history of malignant neoplasm of breast: Secondary | ICD-10-CM | POA: Diagnosis not present

## 2020-10-23 DIAGNOSIS — Z8249 Family history of ischemic heart disease and other diseases of the circulatory system: Secondary | ICD-10-CM | POA: Diagnosis not present

## 2020-10-23 DIAGNOSIS — Z800 Family history of malignant neoplasm of digestive organs: Secondary | ICD-10-CM | POA: Insufficient documentation

## 2020-10-23 DIAGNOSIS — M65312 Trigger thumb, left thumb: Secondary | ICD-10-CM | POA: Insufficient documentation

## 2020-10-23 DIAGNOSIS — Z820 Family history of epilepsy and other diseases of the nervous system: Secondary | ICD-10-CM | POA: Diagnosis not present

## 2020-10-23 HISTORY — PX: CARPAL TUNNEL RELEASE: SHX101

## 2020-10-23 SURGERY — CARPAL TUNNEL RELEASE
Anesthesia: General | Laterality: Right

## 2020-10-23 MED ORDER — ONDANSETRON HCL 4 MG/2ML IJ SOLN: 4.0000 mg | Freq: Once | INTRAMUSCULAR | Status: DC | PRN

## 2020-10-23 MED ORDER — EPHEDRINE 5 MG/ML INJ
INTRAVENOUS | Status: AC
  Filled 2020-10-23: qty 10

## 2020-10-23 MED ORDER — ONDANSETRON HCL 4 MG/2ML IJ SOLN
INTRAMUSCULAR | Status: AC
  Filled 2020-10-23: qty 2

## 2020-10-23 MED ORDER — FAMOTIDINE 20 MG PO TABS: 20.0000 mg | ORAL_TABLET | Freq: Once | ORAL | Status: AC

## 2020-10-23 MED ORDER — LIDOCAINE HCL (PF) 1 % IJ SOLN
INTRAMUSCULAR | Status: DC | PRN
  Administered 2020-10-23: 1 mL

## 2020-10-23 MED ORDER — OXYCODONE HCL 5 MG PO TABS
5.0000 mg | ORAL_TABLET | ORAL | 0 refills | Status: DC | PRN
Start: 2020-10-23 — End: 2021-04-16

## 2020-10-23 MED ORDER — FENTANYL CITRATE (PF) 100 MCG/2ML IJ SOLN
INTRAMUSCULAR | Status: DC | PRN
  Administered 2020-10-23 (×4): 25 ug via INTRAVENOUS

## 2020-10-23 MED ORDER — PROPOFOL 10 MG/ML IV BOLUS
INTRAVENOUS | Status: DC | PRN
  Administered 2020-10-23: 130 mg via INTRAVENOUS

## 2020-10-23 MED ORDER — FENTANYL CITRATE (PF) 100 MCG/2ML IJ SOLN
INTRAMUSCULAR | Status: AC
  Filled 2020-10-23: qty 2

## 2020-10-23 MED ORDER — ACETAMINOPHEN 325 MG PO TABS
325.0000 mg | ORAL_TABLET | Freq: Four times a day (QID) | ORAL | 1 refills | Status: DC | PRN
Start: 2020-10-23 — End: 2023-05-11

## 2020-10-23 MED ORDER — BETAMETHASONE SOD PHOS & ACET 6 (3-3) MG/ML IJ SUSP
INTRAMUSCULAR | Status: DC | PRN
  Administered 2020-10-23: .5 mL via INTRA_ARTICULAR

## 2020-10-23 MED ORDER — ONDANSETRON HCL 4 MG/2ML IJ SOLN
INTRAMUSCULAR | Status: DC | PRN
  Administered 2020-10-23: 4 mg via INTRAVENOUS

## 2020-10-23 MED ORDER — CHLORHEXIDINE GLUCONATE CLOTH 2 % EX PADS
6.0000 | MEDICATED_PAD | Freq: Once | CUTANEOUS | Status: AC
  Administered 2020-10-23: 6 via TOPICAL

## 2020-10-23 MED ORDER — FENTANYL CITRATE (PF) 100 MCG/2ML IJ SOLN: 25.0000 ug | INTRAMUSCULAR | Status: DC | PRN

## 2020-10-23 MED ORDER — LACTATED RINGERS IV SOLN: INTRAVENOUS | Status: DC

## 2020-10-23 MED ORDER — LIDOCAINE HCL (CARDIAC) PF 100 MG/5ML IV SOSY
PREFILLED_SYRINGE | INTRAVENOUS | Status: DC | PRN
  Administered 2020-10-23: 80 mg via INTRAVENOUS

## 2020-10-23 MED ORDER — DEXAMETHASONE SODIUM PHOSPHATE 10 MG/ML IJ SOLN
INTRAMUSCULAR | Status: DC | PRN
  Administered 2020-10-23: 10 mg via INTRAVENOUS

## 2020-10-23 MED ORDER — CHLORHEXIDINE GLUCONATE 0.12 % MT SOLN: 15.0000 mL | Freq: Once | OROMUCOSAL | Status: AC

## 2020-10-23 MED ORDER — CEFAZOLIN SODIUM-DEXTROSE 2-4 GM/100ML-% IV SOLN
2.0000 g | INTRAVENOUS | Status: AC
  Administered 2020-10-23: 2 g via INTRAVENOUS

## 2020-10-23 MED ORDER — EPHEDRINE SULFATE 50 MG/ML IJ SOLN
INTRAMUSCULAR | Status: DC | PRN
  Administered 2020-10-23 (×2): 10 mg via INTRAVENOUS

## 2020-10-23 MED ORDER — FAMOTIDINE 20 MG PO TABS
ORAL_TABLET | ORAL | Status: AC
  Administered 2020-10-23: 20 mg via ORAL
  Filled 2020-10-23: qty 1

## 2020-10-23 MED ORDER — ORAL CARE MOUTH RINSE: 15.0000 mL | Freq: Once | OROMUCOSAL | Status: AC

## 2020-10-23 MED ORDER — ACETAMINOPHEN 500 MG PO TABS
ORAL_TABLET | ORAL | Status: AC
  Administered 2020-10-23: 1000 mg via ORAL
  Filled 2020-10-23: qty 2

## 2020-10-23 MED ORDER — DEXAMETHASONE SODIUM PHOSPHATE 10 MG/ML IJ SOLN
INTRAMUSCULAR | Status: AC
  Filled 2020-10-23: qty 1

## 2020-10-23 MED ORDER — ONDANSETRON HCL 4 MG PO TABS: 4.0000 mg | ORAL_TABLET | Freq: Three times a day (TID) | ORAL | 0 refills | Status: DC | PRN

## 2020-10-23 MED ORDER — ACETAMINOPHEN 500 MG PO TABS: 1000.0000 mg | ORAL_TABLET | ORAL | Status: AC

## 2020-10-23 MED ORDER — BETAMETHASONE SOD PHOS & ACET 6 (3-3) MG/ML IJ SUSP
12.0000 mg | Freq: Once | INTRAMUSCULAR | Status: DC
  Filled 2020-10-23: qty 2

## 2020-10-23 MED ORDER — PROPOFOL 10 MG/ML IV BOLUS
INTRAVENOUS | Status: AC
  Filled 2020-10-23: qty 20

## 2020-10-23 MED ORDER — CHLORHEXIDINE GLUCONATE 0.12 % MT SOLN
OROMUCOSAL | Status: AC
  Administered 2020-10-23: 15 mL via OROMUCOSAL
  Filled 2020-10-23: qty 15

## 2020-10-23 MED ORDER — NEOMYCIN-POLYMYXIN B GU 40-200000 IR SOLN
Status: DC | PRN
  Administered 2020-10-23: 2 mL

## 2020-10-23 MED ORDER — CEFAZOLIN SODIUM-DEXTROSE 2-4 GM/100ML-% IV SOLN
INTRAVENOUS | Status: AC
  Filled 2020-10-23: qty 100

## 2020-10-23 SURGICAL SUPPLY — 47 items
BLADE SURG MINI STRL (BLADE) ×2 IMPLANT
BNDG CMPR STD VLCR NS LF 5.8X4 (GAUZE/BANDAGES/DRESSINGS) ×2
BNDG ELASTIC 4X5.8 VLCR NS LF (GAUZE/BANDAGES/DRESSINGS) ×4 IMPLANT
BNDG ESMARK 4X12 TAN STRL LF (GAUZE/BANDAGES/DRESSINGS) ×2 IMPLANT
CANISTER SUCT 1200ML W/VALVE (MISCELLANEOUS) ×2 IMPLANT
CORD BIP STRL DISP 12FT (MISCELLANEOUS) ×2 IMPLANT
COVER WAND RF STERILE (DRAPES) ×2 IMPLANT
CUFF TOURN SGL QUICK 18X4 (TOURNIQUET CUFF) ×1 IMPLANT
DRAPE SPLIT 6X30 W/TAPE (DRAPES) ×2 IMPLANT
DRAPE SURG 17X11 SM STRL (DRAPES) ×2 IMPLANT
DRSG GAUZE FLUFF 36X18 (GAUZE/BANDAGES/DRESSINGS) ×2 IMPLANT
DURAPREP 26ML APPLICATOR (WOUND CARE) ×4 IMPLANT
ELECT REM PT RETURN 9FT ADLT (ELECTROSURGICAL) ×2
ELECTRODE REM PT RTRN 9FT ADLT (ELECTROSURGICAL) ×1 IMPLANT
FORCEPS JEWEL BIP 4-3/4 STR (INSTRUMENTS) ×2 IMPLANT
GAUZE SPONGE 4X4 12PLY STRL (GAUZE/BANDAGES/DRESSINGS) ×2 IMPLANT
GAUZE XEROFORM 1X8 LF (GAUZE/BANDAGES/DRESSINGS) ×2 IMPLANT
GLOVE BIOGEL PI IND STRL 9 (GLOVE) ×1 IMPLANT
GLOVE BIOGEL PI INDICATOR 9 (GLOVE) ×1
GLOVE SURG 9.0 ORTHO LTXF (GLOVE) ×4 IMPLANT
GOWN STRL REUS TWL 2XL XL LVL4 (GOWN DISPOSABLE) ×2 IMPLANT
GOWN STRL REUS W/ TWL LRG LVL3 (GOWN DISPOSABLE) ×1 IMPLANT
GOWN STRL REUS W/TWL LRG LVL3 (GOWN DISPOSABLE) ×2
KIT TURNOVER KIT A (KITS) ×2 IMPLANT
NDL FILTER BLUNT 18X1 1/2 (NEEDLE) ×1 IMPLANT
NEEDLE FILTER BLUNT 18X 1/2SAF (NEEDLE) ×1
NEEDLE FILTER BLUNT 18X1 1/2 (NEEDLE) ×1 IMPLANT
NS IRRIG 500ML POUR BTL (IV SOLUTION) ×2 IMPLANT
PACK EXTREMITY (MISCELLANEOUS) ×2 IMPLANT
PAD ABD DERMACEA PRESS 5X9 (GAUZE/BANDAGES/DRESSINGS) ×2 IMPLANT
PAD CAST CTTN 4X4 STRL (SOFTGOODS) ×2 IMPLANT
PADDING CAST 4IN STRL (MISCELLANEOUS) ×3
PADDING CAST BLEND 4X4 STRL (MISCELLANEOUS) ×3 IMPLANT
PADDING CAST COTTON 4X4 STRL (SOFTGOODS) ×4
SLING ARM LRG DEEP (SOFTGOODS) ×2 IMPLANT
SLING ARM M TX990204 (SOFTGOODS) ×2 IMPLANT
SPLINT CAST 1 STEP 3X12 (MISCELLANEOUS) ×2 IMPLANT
STOCKINETTE 48X4 2 PLY STRL (GAUZE/BANDAGES/DRESSINGS) ×1 IMPLANT
STOCKINETTE STRL 4IN 9604848 (GAUZE/BANDAGES/DRESSINGS) ×2 IMPLANT
STRIP CLOSURE SKIN 1/2X4 (GAUZE/BANDAGES/DRESSINGS) ×2 IMPLANT
SUT ETHILON 3-0 FS-10 30 BLK (SUTURE) ×2
SUT ETHILON 4-0 (SUTURE) ×2
SUT ETHILON 4-0 FS2 18XMFL BLK (SUTURE) ×1
SUT ETHILON 5-0 FS-2 18 BLK (SUTURE) ×3 IMPLANT
SUTURE EHLN 3-0 FS-10 30 BLK (SUTURE) ×1 IMPLANT
SUTURE ETHLN 4-0 FS2 18XMF BLK (SUTURE) ×1 IMPLANT
SYR 3ML LL SCALE MARK (SYRINGE) ×2 IMPLANT

## 2020-10-23 NOTE — Op Note (Signed)
10/23/2020  11:30 AM  PATIENT:  Krista Gentry    PRE-OPERATIVE DIAGNOSIS:  Right Carpal Tunnel Syndrome and left trigger thumb  POST-OPERATIVE DIAGNOSIS:  Same  PROCEDURE:  RIGHT CARPAL TUNNEL RELEASE and LEFT THUMB TRIGGER INJECTION  SURGEON:  Thornton Park, MD  ANESTHESIA:   General  PREOPERATIVE INDICATIONS:  Krista Gentry is a  74 y.o. female with a diagnosis of Right Carpal Tunnel Syndrome and left trigger thumb who failed conservative measures and elected for surgical management.    I discussed the risks and benefits of surgery. The risks include but are not limited to infection, bleeding, nerve or blood vessel injury, joint stiffness or loss of motion, persistent pain, weakness, recurrence of symptoms and the need for further surgery. Medical risks include but are not limited to DVT and pulmonary embolism, myocardial infarction, stroke, pneumonia, respiratory failure and death. Patient understood these risks and wished to proceed.   OPERATIVE FINDINGS: Significant median nerve compression at the carpal tunnel, right upper extremity.    OPERATIVE PROCEDURE: Patient was met in the preoperative area. I signed the right wrist and left thumb with my initials and the word yes according the hospital's correct site of surgery protocol. The H&P was performed at the bedside. I answered all the patient's questions.  She was then brought to the operating room where she underwent general anesthesia.  Patient was positioned supine on the operative table. The right arm was placed on a hand table. A tourniquet was applied to the right upper extremity.  The right upper extremity was prepped and draped in a sterile fashion. A timeout was performed to verify the patient's name, date of birth, medical record number, correct site of surgery correct procedure to be performed. The time out was also used to confirm the patient received antibiotics that all necessary instruments were available in the room.  The right upper extremity was then exsanguinated with an Esmarch and the tourniquet inflated to 250 mmHg.   An incision following the palmar crease was made. This was made in line with the web space between the middle and ring fingers and the distal extent of the incision was where it intersected Kaplan's cardinal line. Bleeding vessels were cauterized with a bipolar.  The subcutaneous tissue was carefully dissected out with a Metzenbaum scissor and pickup until the palmar fascia was encountered. The distal extent of the transverse carpal ligament was then identified. A Freer elevator was placed under the transverse carpal ligament running distally to proximally. A micro-Beaver blade was then used to incise the transverse carpal ligament taking care to avoid injury to any neurovascular structures. The carpal tunnel was found to be extremely constricted. There was significant compression on the median nerve. The transverse carpal ligament was completely released. The nerve was visualized in its entirety and the carpal tunnel. The wound was copiously irrigated. The skin was then approximated with 5-0 nylon. Xeroform was placed over the incision. A dry sterile dressing was applied along with a volar fiberglass splint. Patient was overwrapped with an Ace wrap. The tourniquet was deflated at 30 minutes.  Sling was placed on the right upper extremity.   The patient's left thumb was then prepped with ChloraPrep.  A timeout was performed to confirm that the patient had a left trigger thumb.  Once all in attendance were in agreement, the patient was injected in the area of the A1 pulley of the left thumb using a 21-gauge needle with 3 mg of Celestone and 1 cc  of 1% lidocaine plain.  The injection site was then cleaned with alcohol and a Band-Aid was applied.  The patient was extubated and brought to the PACU in stable condition. I was scrubbed and present the entire case and all sharp, sponge and instrument counts were  correct at the conclusion the case.   Kevin L. Krasinski, MD 

## 2020-10-23 NOTE — Discharge Instructions (Signed)

## 2020-10-23 NOTE — Transfer of Care (Signed)
Immediate Anesthesia Transfer of Care Note  Patient: Krista Gentry  Procedure(s) Performed: RIGHT CARPAL TUNNEL RELEASE and LEFT THUMB TRIGGER INJECTION (Right )  Patient Location: PACU  Anesthesia Type:General  Level of Consciousness: awake, alert  and oriented  Airway & Oxygen Therapy: Patient Spontanous Breathing and Patient connected to face mask oxygen  Post-op Assessment: Report given to RN and Post -op Vital signs reviewed and stable  Post vital signs: stable  Last Vitals:  Vitals Value Taken Time  BP 105/64 10/23/20 1125  Temp    Pulse 77 10/23/20 1128  Resp 11 10/23/20 1128  SpO2 100 % 10/23/20 1128  Vitals shown include unvalidated device data.  Last Pain:  Vitals:   10/23/20 0814  TempSrc: Temporal  PainSc: 3          Complications: No complications documented.

## 2020-10-23 NOTE — Anesthesia Postprocedure Evaluation (Signed)
Anesthesia Post Note  Patient: Krista Gentry  Procedure(s) Performed: RIGHT CARPAL TUNNEL RELEASE and LEFT THUMB TRIGGER INJECTION (Right )  Patient location during evaluation: PACU Anesthesia Type: General Level of consciousness: awake and alert Pain management: pain level controlled Vital Signs Assessment: post-procedure vital signs reviewed and stable Respiratory status: spontaneous breathing, nonlabored ventilation, respiratory function stable and patient connected to nasal cannula oxygen Cardiovascular status: blood pressure returned to baseline and stable Postop Assessment: no apparent nausea or vomiting Anesthetic complications: no   No complications documented.   Last Vitals:  Vitals:   10/23/20 1211 10/23/20 1228  BP: 110/60 (!) 132/56  Pulse: 65 67  Resp: 12 14  Temp: 36.6 C (!) 36.3 C  SpO2: 100% 100%    Last Pain:  Vitals:   10/23/20 1228  TempSrc: Tympanic  PainSc: 1                  Martha Clan

## 2020-10-23 NOTE — H&P (Signed)
PREOPERATIVE H&P  Chief Complaint: Right Carpal Tunnel Syndrome and left trigger thumb  HPI: Krista Gentry is a 74 y.o. female who presents for preoperative history and physical with a diagnosis of Right Carpal Tunnel Syndrome and left trigger thumb. Symptoms of numbness and tingling in the right thumb index middle and ring finger and triggering of the left thumb.  Patient has an EMG and nerve conduction study of the right upper extremity showing advanced carpal tunnel syndrome in the right wrist.  Patient is experiencing numbness in the right hand and is dropping things which is significantly impairing activities of daily living.  Her left thumb is getting stuck in flexion which is also affecting her ADLs.  She wished to proceed with open carpal tunnel release the right wrist and a corticosteroid injection in the area of the left thumb A1 pulley.  Past Medical History:  Diagnosis Date  . Allergy   . Basal cell carcinoma 10/08/2020   right lateral calf  . Family history of adverse reaction to anesthesia    sister had difficult waking up from anesthesia . had pseudo cholinestrase   Past Surgical History:  Procedure Laterality Date  . ABDOMINAL HYSTERECTOMY    . BILATERAL OOPHORECTOMY    . BREAST BIOPSY    . CHOLECYSTECTOMY  1994   Social History   Socioeconomic History  . Marital status: Married    Spouse name: Not on file  . Number of children: Not on file  . Years of education: Not on file  . Highest education level: Not on file  Occupational History  . Not on file  Tobacco Use  . Smoking status: Former Smoker    Packs/day: 1.00    Years: 5.00    Pack years: 5.00    Types: Cigarettes    Quit date: 04/14/1970    Years since quitting: 50.5  . Smokeless tobacco: Never Used  Vaping Use  . Vaping Use: Never used  Substance and Sexual Activity  . Alcohol use: Yes    Alcohol/week: 3.0 standard drinks    Types: 2 Glasses of wine, 1 Cans of beer per week    Comment: occassion   . Drug use: No  . Sexual activity: Not on file  Other Topics Concern  . Not on file  Social History Narrative  . Not on file   Social Determinants of Health   Financial Resource Strain: Low Risk   . Difficulty of Paying Living Expenses: Not hard at all  Food Insecurity: No Food Insecurity  . Worried About Charity fundraiser in the Last Year: Never true  . Ran Out of Food in the Last Year: Never true  Transportation Needs: No Transportation Needs  . Lack of Transportation (Medical): No  . Lack of Transportation (Non-Medical): No  Physical Activity:   . Days of Exercise per Week: Not on file  . Minutes of Exercise per Session: Not on file  Stress: No Stress Concern Present  . Feeling of Stress : Not at all  Social Connections:   . Frequency of Communication with Friends and Family: Not on file  . Frequency of Social Gatherings with Friends and Family: Not on file  . Attends Religious Services: Not on file  . Active Member of Clubs or Organizations: Not on file  . Attends Archivist Meetings: Not on file  . Marital Status: Not on file   Family History  Problem Relation Age of Onset  . Kidney disease Mother  nephrolithiasis  . Dementia Mother   . Hypertension Mother   . Kidney Stones Mother   . Mental retardation Sister 8       possible autism,  now with depression  . Dementia Sister   . Cancer Maternal Aunt        breast cancer  . Dementia Maternal Aunt   . Mental retardation Maternal Aunt   . Breast cancer Maternal Aunt   . Dementia Maternal Uncle   . Cancer Maternal Grandmother        breast cancer  . Breast cancer Maternal Grandmother   . Heart attack Paternal Grandmother   . Kidney Stones Brother   . Cancer Maternal Grandfather        Stomach cancer  . Colon cancer Neg Hx    No Known Allergies Prior to Admission medications   Medication Sig Start Date End Date Taking? Authorizing Provider  Barberry-Oreg Grape-Goldenseal (BERBERINE COMPLEX  PO) Take 500 mcg by mouth daily.   Yes [provider]  cetirizine (ZYRTEC) 10 MG tablet Take 10 mg by mouth daily as needed for allergies.    Yes [provider]  Cholecalciferol (VITAMIN D) 50 MCG (2000 UT) tablet Take 6,000 Units by mouth daily.   Yes [provider]  CRANBERRY EXTRACT PO Take 4,200 mg by mouth daily.    Yes [provider]  Cyanocobalamin (B-12) 5000 MCG SUBL Place 5,000 mcg under the tongue daily.   Yes [provider]  Multiple Minerals-Vitamins (CITRACAL MAXIMUM PLUS) TABS Take 2 tablets by mouth daily.    Yes [provider]  Multiple Vitamins-Minerals (IMMUNE SUPPORT PO) Take 2 tablets by mouth daily.   Yes [provider]  mupirocin ointment (BACTROBAN) 2 % Apply 1 application topically daily. 10/08/20  Yes Moye, [place], MD  OVER THE COUNTER MEDICATION Take 1 tablet by mouth daily. Nutratherm otc supplement   Yes [provider]  Potassium 99 MG TABS Take 99 mg by mouth daily.  05/20/20  Yes [provider]  Quercetin 250 MG TABS Take 250 mg by mouth daily.    Yes [provider]  Zinc 50 MG CAPS Take 50 mg by mouth daily.    Yes [provider]  ACETYLCARNITINE PO Take 1,500 mg by mouth.    [provider]  amLODipine (NORVASC) 2.5 MG tablet Take 1 tablet (2.5 mg total) by mouth daily. Patient not taking: Reported on 10/15/2020 09/08/20   Burnard Hawthorne, FNP  melatonin 5 MG TABS Take 5 mg by mouth.    [provider]  Menaquinone-7 (VITAMIN K2 PO) Take 600 mcg by mouth daily. Patient not taking: Reported on 10/23/2020 12/21/19   [provider]  triamcinolone cream (KENALOG) 0.1 % Apply 1-2 times daily up to 2 weeks at a time as needed for itch. Avoid applying to face, groin, and axilla. Use as directed. Risk of skin atrophy with long-term use reviewed. Patient not taking: Reported on 10/15/2020 10/08/20   Alfonso Patten, MD  TURMERIC-GINGER PO  Take by mouth.    [provider]     Positive ROS: All other systems have been reviewed and were otherwise negative with the exception of those mentioned in the HPI and as above.  Physical Exam: General: Alert, no acute distress Cardiovascular: Regular rate and rhythm, no murmurs rubs or gallops.  No pedal edema Respiratory: Clear to auscultation bilaterally, no wheezes rales or rhonchi. No cyanosis, no use of accessory musculature GI: No organomegaly, abdomen is  soft and non-tender nondistended with positive bowel sounds. Skin: Skin intact, no lesions within the operative field. Neurologic: Sensation intact distally Psychiatric: Patient is competent for consent with normal mood and affect Lymphatic: No cervical lymphadenopathy  MUSCULOSKELETAL: Right hand: Patient is intact skin.  There is no erythema ecchymosis or swelling.  Patient does not have significant thenar or hyperthenar atrophy.  Her hand is well-perfused.  She has diminished sensation light touch to the thumb index middle and radial half of the ring finger compared to left hand.  She does not demonstrate obvious weakness to pinch strength.  Left hand: Patient demonstrates active triggering of the left thumb for which she has to actively extend the IP joint with her right hand.  Patient has tenderness over the A1 pulley.  Thumb is neurovascular intact.  Assessment: Right Carpal Tunnel Syndrome and left trigger thumb  Plan: Plan for Procedure(s): RIGHT CARPAL TUNNEL RELEASE and LEFT THUMB TRIGGER INJECTION  I reviewed the details of the operation as well as the postoperative course with the patient.  I discussed the risks and benefits of surgery. The risks include but are not limited to infection, bleeding, nerve or blood vessel injury to the median nerve, joint stiffness or loss of motion, persistent pain, weakness or instability, recurrent symptoms of carpal tunnel or left trigger thumb and the need for further  surgery. Medical risks include but are not limited to DVT and pulmonary embolism, myocardial infarction, stroke, pneumonia, respiratory failure and death. Patient understood these risks and wished to proceed.    Thornton Park, MD   10/23/2020 10:10 AM

## 2020-10-23 NOTE — Anesthesia Preprocedure Evaluation (Signed)
Anesthesia Evaluation  Patient identified by MRN, date of birth, ID band Patient awake    Reviewed: Allergy & Precautions, H&P , NPO status , Patient's Chart, lab work & pertinent test results, reviewed documented beta blocker date and time   History of Anesthesia Complications (+) Family history of anesthesia reaction and history of anesthetic complications (sister with pseudocholinesterase deficiency)  Airway Mallampati: I  TM Distance: >3 FB Neck ROM: full    Dental  (+) Dental Advidsory Given, Caps, Teeth Intact   Pulmonary neg pulmonary ROS, former smoker,    Pulmonary exam normal breath sounds clear to auscultation       Cardiovascular Exercise Tolerance: Good hypertension, (-) angina(-) Past MI and (-) Cardiac Stents Normal cardiovascular exam(-) dysrhythmias (-) Valvular Problems/Murmurs Rhythm:regular Rate:Normal     Neuro/Psych negative neurological ROS  negative psych ROS   GI/Hepatic Neg liver ROS, GERD  ,  Endo/Other  negative endocrine ROS  Renal/GU negative Renal ROS  negative genitourinary   Musculoskeletal   Abdominal   Peds  Hematology negative hematology ROS (+)   Anesthesia Other Findings Past Medical History: No date: Allergy 10/08/2020: Basal cell carcinoma     Comment:  right lateral calf No date: Family history of adverse reaction to anesthesia     Comment:  sister had difficult waking up from anesthesia . had               pseudo cholinestrase   Reproductive/Obstetrics negative OB ROS                             Anesthesia Physical Anesthesia Plan  ASA: II  Anesthesia Plan: General   Post-op Pain Management:    Induction: Intravenous  PONV Risk Score and Plan: 3 and Ondansetron and Dexamethasone  Airway Management Planned: LMA  Additional Equipment:   Intra-op Plan:   Post-operative Plan: Extubation in OR  Informed Consent: I have reviewed the  patients History and Physical, chart, labs and discussed the procedure including the risks, benefits and alternatives for the proposed anesthesia with the patient or authorized representative who has indicated his/her understanding and acceptance.     Dental Advisory Given  Plan Discussed with: Anesthesiologist, CRNA and Surgeon  Anesthesia Plan Comments:         Anesthesia Quick Evaluation

## 2020-10-24 ENCOUNTER — Encounter: Payer: Self-pay | Admitting: Orthopedic Surgery

## 2020-10-29 ENCOUNTER — Encounter: Payer: Self-pay | Admitting: Family

## 2020-10-29 DIAGNOSIS — G5603 Carpal tunnel syndrome, bilateral upper limbs: Secondary | ICD-10-CM | POA: Diagnosis not present

## 2020-10-31 ENCOUNTER — Telehealth: Payer: Self-pay | Admitting: Family

## 2020-10-31 NOTE — Telephone Encounter (Signed)
Patient notified of negative results.

## 2020-10-31 NOTE — Telephone Encounter (Signed)
Call pt     let  know Cologuard NEGATIVE which indicates lower likelihood of cancer or pre cancer. You will need to repeat test in 3 years or sooner if any symptoms including blood in stool, change in bowel habits.   

## 2020-11-04 DIAGNOSIS — G5601 Carpal tunnel syndrome, right upper limb: Secondary | ICD-10-CM | POA: Diagnosis not present

## 2020-11-11 DIAGNOSIS — G5601 Carpal tunnel syndrome, right upper limb: Secondary | ICD-10-CM | POA: Diagnosis not present

## 2020-11-12 ENCOUNTER — Ambulatory Visit
Admission: RE | Admit: 2020-11-12 | Discharge: 2020-11-12 | Disposition: A | Discharge status: Home / Self Care | Payer: Medicare Other | Source: Ambulatory Visit | Attending: Family | Admitting: Family

## 2020-11-12 ENCOUNTER — Other Ambulatory Visit: Payer: Self-pay

## 2020-11-12 DIAGNOSIS — Z1231 Encounter for screening mammogram for malignant neoplasm of breast: Secondary | ICD-10-CM | POA: Insufficient documentation

## 2020-11-18 DIAGNOSIS — G5601 Carpal tunnel syndrome, right upper limb: Secondary | ICD-10-CM | POA: Diagnosis not present

## 2020-11-27 DIAGNOSIS — G5601 Carpal tunnel syndrome, right upper limb: Secondary | ICD-10-CM | POA: Diagnosis not present

## 2020-12-03 ENCOUNTER — Ambulatory Visit: Payer: Medicare Other | Admitting: Dermatology

## 2020-12-03 ENCOUNTER — Other Ambulatory Visit: Payer: Self-pay

## 2020-12-03 DIAGNOSIS — D2372 Other benign neoplasm of skin of left lower limb, including hip: Secondary | ICD-10-CM | POA: Diagnosis not present

## 2020-12-03 DIAGNOSIS — D239 Other benign neoplasm of skin, unspecified: Secondary | ICD-10-CM

## 2020-12-03 DIAGNOSIS — C44712 Basal cell carcinoma of skin of right lower limb, including hip: Secondary | ICD-10-CM | POA: Diagnosis not present

## 2020-12-03 NOTE — Patient Instructions (Addendum)
Wound Care Instructions  1. Cleanse wound gently with soap and water once a day then pat dry with clean gauze. Apply a thing coat of Petrolatum (petroleum jelly, "Vaseline") over the wound (unless you have an allergy to this). We recommend that you use a new, sterile tube of Vaseline. Do not pick or remove scabs. Do not remove the yellow or white "healing tissue" from the base of the wound.  2. Cover the wound with fresh, clean, nonstick gauze and secure with paper tape. You may use Band-Aids in place of gauze and tape if the would is small enough, but would recommend trimming much of the tape off as there is often too much. Sometimes Band-Aids can irritate the skin.  3. You should call the office for your biopsy report after 1 week if you have not already been contacted.  4. If you experience any problems, such as abnormal amounts of bleeding, swelling, significant bruising, significant pain, or evidence of infection, please call the office immediately.  5. FOR ADULT SURGERY PATIENTS: If you need something for pain relief you may take 1 extra strength Tylenol (acetaminophen) AND 2 Ibuprofen (200mg each) together every 4 hours as needed for pain. (do not take these if you are allergic to them or if you have a reason you should not take them.) Typically, you may only need pain medication for 1 to 3 days.   Melanoma ABCDEs  Melanoma is the most dangerous type of skin cancer, and is the leading cause of death from skin disease.  You are more likely to develop melanoma if you:  Have light-colored skin, light-colored eyes, or red or blond hair  Spend a lot of time in the sun  Tan regularly, either outdoors or in a tanning bed  Have had blistering sunburns, especially during childhood  Have a close family member who has had a melanoma  Have atypical moles or large birthmarks  Early detection of melanoma is key since treatment is typically straightforward and cure rates are extremely high if we  catch it early.   The first sign of melanoma is often a change in a mole or a new dark spot.  The ABCDE system is a way of remembering the signs of melanoma.  A for asymmetry:  The two halves do not match. B for border:  The edges of the growth are irregular. C for color:  A mixture of colors are present instead of an even brown color. D for diameter:  Melanomas are usually (but not always) greater than 6mm - the size of a pencil eraser. E for evolution:  The spot keeps changing in size, shape, and color.  Please check your skin once per month between visits. You can use a small mirror in front and a large mirror behind you to keep an eye on the back side or your body.   If you see any new or changing lesions before your next follow-up, please call to schedule a visit.  Please continue daily skin protection including broad spectrum sunscreen SPF 30+ to sun-exposed areas, reapplying every 2 hours as needed when you're outdoors.     

## 2020-12-03 NOTE — Progress Notes (Signed)
   Follow-Up Visit   Subjective  Krista Gentry is a 74 y.o. female who presents for the following: Procedure (Patient here today for [facility] at right lateral calf for bx proven BCC. ) She also notes a spot at her left leg.  The following portions of the chart were reviewed this encounter and updated as appropriate:   Tobacco  Allergies  Meds  Problems  Med Hx  Surg Hx  Fam Hx      Review of Systems:  No other skin or systemic complaints except as noted in HPI or Assessment and Plan.  Objective  Well appearing patient in no apparent distress; mood and affect are within normal limits.  A focused examination was performed including legs. Relevant physical exam findings are noted in the Assessment and Plan.  Objective  Right lateral calf: Healing biopsy site  Objective  Left lateral calf: Firm pink/brown papulenodule with dimple sign.    Assessment & Plan  Basal cell carcinoma (BCC) of skin of right lower extremity including hip Right lateral calf  Destruction of lesion  Destruction method: electrodesiccation and curettage   Informed consent: discussed and consent obtained   Timeout:  patient name, date of birth, surgical site, and procedure verified Patient was prepped and draped in usual sterile fashion: area prepped with isopropyl alcohol. Anesthesia: the lesion was anesthetized in a standard fashion   Anesthetic:  1% lidocaine w/ epinephrine 1-100,000 buffered w/ 8.4% NaHCO3 Curettage performed in three different directions: Yes   Electrodesiccation performed over the curetted area: Yes   Curettage cycles:  3 Final wound size (cm):  1.2 Hemostasis achieved with:  electrodesiccation Outcome: patient tolerated procedure well with no complications   Post-procedure details: sterile dressing applied and wound care instructions given   Dressing type: petrolatum   Additional details:  Mupirocin and a pressure dressing applied  Apply mupirocin once daily and cover with  bandaid.   Dermatofibroma Left lateral calf  Benign-appearing.  Observation.  Call clinic for new or changing lesions.  Recommend daily use of broad spectrum spf 30+ sunscreen to sun-exposed areas.    Return for as scheduled, TBSE.  Graciella Belton, RMA, am acting as scribe for [name], MD .  Documentation: I have reviewed the above documentation for accuracy and completeness, and I agree with the above.  [name], MD

## 2020-12-05 DIAGNOSIS — M5431 Sciatica, right side: Secondary | ICD-10-CM | POA: Diagnosis not present

## 2020-12-05 DIAGNOSIS — M9905 Segmental and somatic dysfunction of pelvic region: Secondary | ICD-10-CM | POA: Diagnosis not present

## 2020-12-05 DIAGNOSIS — M4306 Spondylolysis, lumbar region: Secondary | ICD-10-CM | POA: Diagnosis not present

## 2020-12-05 DIAGNOSIS — M9903 Segmental and somatic dysfunction of lumbar region: Secondary | ICD-10-CM | POA: Diagnosis not present

## 2020-12-08 DIAGNOSIS — M9903 Segmental and somatic dysfunction of lumbar region: Secondary | ICD-10-CM | POA: Diagnosis not present

## 2020-12-08 DIAGNOSIS — M9905 Segmental and somatic dysfunction of pelvic region: Secondary | ICD-10-CM | POA: Diagnosis not present

## 2020-12-08 DIAGNOSIS — M4306 Spondylolysis, lumbar region: Secondary | ICD-10-CM | POA: Diagnosis not present

## 2020-12-08 DIAGNOSIS — M5431 Sciatica, right side: Secondary | ICD-10-CM | POA: Diagnosis not present

## 2020-12-09 DIAGNOSIS — G5601 Carpal tunnel syndrome, right upper limb: Secondary | ICD-10-CM | POA: Diagnosis not present

## 2020-12-10 ENCOUNTER — Telehealth: Payer: Self-pay

## 2020-12-10 ENCOUNTER — Encounter: Payer: Self-pay | Admitting: Dermatology

## 2020-12-10 DIAGNOSIS — M9903 Segmental and somatic dysfunction of lumbar region: Secondary | ICD-10-CM | POA: Diagnosis not present

## 2020-12-10 DIAGNOSIS — M4306 Spondylolysis, lumbar region: Secondary | ICD-10-CM | POA: Diagnosis not present

## 2020-12-10 DIAGNOSIS — M9905 Segmental and somatic dysfunction of pelvic region: Secondary | ICD-10-CM | POA: Diagnosis not present

## 2020-12-10 DIAGNOSIS — M5431 Sciatica, right side: Secondary | ICD-10-CM | POA: Diagnosis not present

## 2020-12-10 NOTE — Telephone Encounter (Signed)
Spoke with pt of concerns about redness around area at right lateral calf treated with EDC on 12/06/2020. Advised pt to watch for redness spreading out from site, area being warm to touch, and discharge and to continue mupirocin and cover with bandage. Also advised pt to send MyChart message with picture for Dr. Laurence Ferrari to see.   Informed pt that we are leaving for the holiday and we won't have a Dr. in the office until the first week in January. Advised if she feels it is getting worse to contact her PCP or go to an urgent care if needed.

## 2020-12-11 ENCOUNTER — Encounter: Payer: Self-pay | Admitting: Dermatology

## 2020-12-11 DIAGNOSIS — M5431 Sciatica, right side: Secondary | ICD-10-CM | POA: Diagnosis not present

## 2020-12-11 DIAGNOSIS — M9905 Segmental and somatic dysfunction of pelvic region: Secondary | ICD-10-CM | POA: Diagnosis not present

## 2020-12-11 DIAGNOSIS — M9903 Segmental and somatic dysfunction of lumbar region: Secondary | ICD-10-CM | POA: Diagnosis not present

## 2020-12-11 DIAGNOSIS — M4306 Spondylolysis, lumbar region: Secondary | ICD-10-CM | POA: Diagnosis not present

## 2020-12-11 MED ORDER — DOXYCYCLINE HYCLATE 100 MG PO CAPS: 100.0000 mg | ORAL_CAPSULE | Freq: Two times a day (BID) | ORAL | 0 refills | Status: AC

## 2020-12-16 DIAGNOSIS — M5431 Sciatica, right side: Secondary | ICD-10-CM | POA: Diagnosis not present

## 2020-12-16 DIAGNOSIS — S8392XA Sprain of unspecified site of left knee, initial encounter: Secondary | ICD-10-CM | POA: Diagnosis not present

## 2020-12-16 DIAGNOSIS — M9905 Segmental and somatic dysfunction of pelvic region: Secondary | ICD-10-CM | POA: Diagnosis not present

## 2020-12-16 DIAGNOSIS — S8391XA Sprain of unspecified site of right knee, initial encounter: Secondary | ICD-10-CM | POA: Diagnosis not present

## 2020-12-16 DIAGNOSIS — M9903 Segmental and somatic dysfunction of lumbar region: Secondary | ICD-10-CM | POA: Diagnosis not present

## 2020-12-16 DIAGNOSIS — M4306 Spondylolysis, lumbar region: Secondary | ICD-10-CM | POA: Diagnosis not present

## 2020-12-23 DIAGNOSIS — M1711 Unilateral primary osteoarthritis, right knee: Secondary | ICD-10-CM | POA: Diagnosis not present

## 2020-12-24 DIAGNOSIS — M1710 Unilateral primary osteoarthritis, unspecified knee: Secondary | ICD-10-CM | POA: Insufficient documentation

## 2020-12-24 DIAGNOSIS — M9905 Segmental and somatic dysfunction of pelvic region: Secondary | ICD-10-CM | POA: Diagnosis not present

## 2020-12-24 DIAGNOSIS — M4306 Spondylolysis, lumbar region: Secondary | ICD-10-CM | POA: Diagnosis not present

## 2020-12-24 DIAGNOSIS — M179 Osteoarthritis of knee, unspecified: Secondary | ICD-10-CM | POA: Insufficient documentation

## 2020-12-24 DIAGNOSIS — M5431 Sciatica, right side: Secondary | ICD-10-CM | POA: Diagnosis not present

## 2020-12-24 DIAGNOSIS — M9903 Segmental and somatic dysfunction of lumbar region: Secondary | ICD-10-CM | POA: Diagnosis not present

## 2020-12-24 NOTE — Telephone Encounter (Signed)
Spoke with patient through My Chart. 

## 2020-12-25 DIAGNOSIS — M9905 Segmental and somatic dysfunction of pelvic region: Secondary | ICD-10-CM | POA: Diagnosis not present

## 2020-12-25 DIAGNOSIS — M5431 Sciatica, right side: Secondary | ICD-10-CM | POA: Diagnosis not present

## 2020-12-25 DIAGNOSIS — M9903 Segmental and somatic dysfunction of lumbar region: Secondary | ICD-10-CM | POA: Diagnosis not present

## 2020-12-25 DIAGNOSIS — M4306 Spondylolysis, lumbar region: Secondary | ICD-10-CM | POA: Diagnosis not present

## 2020-12-26 DIAGNOSIS — M9905 Segmental and somatic dysfunction of pelvic region: Secondary | ICD-10-CM | POA: Diagnosis not present

## 2020-12-26 DIAGNOSIS — M9903 Segmental and somatic dysfunction of lumbar region: Secondary | ICD-10-CM | POA: Diagnosis not present

## 2020-12-26 DIAGNOSIS — M4306 Spondylolysis, lumbar region: Secondary | ICD-10-CM | POA: Diagnosis not present

## 2020-12-26 DIAGNOSIS — M5431 Sciatica, right side: Secondary | ICD-10-CM | POA: Diagnosis not present

## 2020-12-29 DIAGNOSIS — M9905 Segmental and somatic dysfunction of pelvic region: Secondary | ICD-10-CM | POA: Diagnosis not present

## 2020-12-29 DIAGNOSIS — M9903 Segmental and somatic dysfunction of lumbar region: Secondary | ICD-10-CM | POA: Diagnosis not present

## 2020-12-29 DIAGNOSIS — M4306 Spondylolysis, lumbar region: Secondary | ICD-10-CM | POA: Diagnosis not present

## 2020-12-29 DIAGNOSIS — M5431 Sciatica, right side: Secondary | ICD-10-CM | POA: Diagnosis not present

## 2020-12-30 DIAGNOSIS — M4306 Spondylolysis, lumbar region: Secondary | ICD-10-CM | POA: Diagnosis not present

## 2020-12-30 DIAGNOSIS — M9903 Segmental and somatic dysfunction of lumbar region: Secondary | ICD-10-CM | POA: Diagnosis not present

## 2020-12-30 DIAGNOSIS — M9905 Segmental and somatic dysfunction of pelvic region: Secondary | ICD-10-CM | POA: Diagnosis not present

## 2020-12-30 DIAGNOSIS — M5431 Sciatica, right side: Secondary | ICD-10-CM | POA: Diagnosis not present

## 2021-01-01 DIAGNOSIS — M9905 Segmental and somatic dysfunction of pelvic region: Secondary | ICD-10-CM | POA: Diagnosis not present

## 2021-01-01 DIAGNOSIS — M9903 Segmental and somatic dysfunction of lumbar region: Secondary | ICD-10-CM | POA: Diagnosis not present

## 2021-01-01 DIAGNOSIS — M4306 Spondylolysis, lumbar region: Secondary | ICD-10-CM | POA: Diagnosis not present

## 2021-01-01 DIAGNOSIS — M5431 Sciatica, right side: Secondary | ICD-10-CM | POA: Diagnosis not present

## 2021-01-06 DIAGNOSIS — M4306 Spondylolysis, lumbar region: Secondary | ICD-10-CM | POA: Diagnosis not present

## 2021-01-06 DIAGNOSIS — M9903 Segmental and somatic dysfunction of lumbar region: Secondary | ICD-10-CM | POA: Diagnosis not present

## 2021-01-06 DIAGNOSIS — M9905 Segmental and somatic dysfunction of pelvic region: Secondary | ICD-10-CM | POA: Diagnosis not present

## 2021-01-06 DIAGNOSIS — M5431 Sciatica, right side: Secondary | ICD-10-CM | POA: Diagnosis not present

## 2021-01-08 DIAGNOSIS — M9903 Segmental and somatic dysfunction of lumbar region: Secondary | ICD-10-CM | POA: Diagnosis not present

## 2021-01-08 DIAGNOSIS — M5431 Sciatica, right side: Secondary | ICD-10-CM | POA: Diagnosis not present

## 2021-01-08 DIAGNOSIS — M4306 Spondylolysis, lumbar region: Secondary | ICD-10-CM | POA: Diagnosis not present

## 2021-01-08 DIAGNOSIS — M9905 Segmental and somatic dysfunction of pelvic region: Secondary | ICD-10-CM | POA: Diagnosis not present

## 2021-01-13 DIAGNOSIS — M5431 Sciatica, right side: Secondary | ICD-10-CM | POA: Diagnosis not present

## 2021-01-13 DIAGNOSIS — M9905 Segmental and somatic dysfunction of pelvic region: Secondary | ICD-10-CM | POA: Diagnosis not present

## 2021-01-13 DIAGNOSIS — M9903 Segmental and somatic dysfunction of lumbar region: Secondary | ICD-10-CM | POA: Diagnosis not present

## 2021-01-13 DIAGNOSIS — M4306 Spondylolysis, lumbar region: Secondary | ICD-10-CM | POA: Diagnosis not present

## 2021-01-15 DIAGNOSIS — M4306 Spondylolysis, lumbar region: Secondary | ICD-10-CM | POA: Diagnosis not present

## 2021-01-15 DIAGNOSIS — M9903 Segmental and somatic dysfunction of lumbar region: Secondary | ICD-10-CM | POA: Diagnosis not present

## 2021-01-15 DIAGNOSIS — M9905 Segmental and somatic dysfunction of pelvic region: Secondary | ICD-10-CM | POA: Diagnosis not present

## 2021-01-15 DIAGNOSIS — M5431 Sciatica, right side: Secondary | ICD-10-CM | POA: Diagnosis not present

## 2021-01-19 DIAGNOSIS — M25661 Stiffness of right knee, not elsewhere classified: Secondary | ICD-10-CM | POA: Diagnosis not present

## 2021-01-19 DIAGNOSIS — M25561 Pain in right knee: Secondary | ICD-10-CM | POA: Diagnosis not present

## 2021-01-20 DIAGNOSIS — M5431 Sciatica, right side: Secondary | ICD-10-CM | POA: Diagnosis not present

## 2021-01-20 DIAGNOSIS — M9903 Segmental and somatic dysfunction of lumbar region: Secondary | ICD-10-CM | POA: Diagnosis not present

## 2021-01-20 DIAGNOSIS — M4306 Spondylolysis, lumbar region: Secondary | ICD-10-CM | POA: Diagnosis not present

## 2021-01-20 DIAGNOSIS — M9905 Segmental and somatic dysfunction of pelvic region: Secondary | ICD-10-CM | POA: Diagnosis not present

## 2021-01-22 DIAGNOSIS — M9905 Segmental and somatic dysfunction of pelvic region: Secondary | ICD-10-CM | POA: Diagnosis not present

## 2021-01-22 DIAGNOSIS — M5431 Sciatica, right side: Secondary | ICD-10-CM | POA: Diagnosis not present

## 2021-01-22 DIAGNOSIS — M9903 Segmental and somatic dysfunction of lumbar region: Secondary | ICD-10-CM | POA: Diagnosis not present

## 2021-01-22 DIAGNOSIS — M4306 Spondylolysis, lumbar region: Secondary | ICD-10-CM | POA: Diagnosis not present

## 2021-01-26 DIAGNOSIS — M9903 Segmental and somatic dysfunction of lumbar region: Secondary | ICD-10-CM | POA: Diagnosis not present

## 2021-01-26 DIAGNOSIS — M9905 Segmental and somatic dysfunction of pelvic region: Secondary | ICD-10-CM | POA: Diagnosis not present

## 2021-01-26 DIAGNOSIS — M4306 Spondylolysis, lumbar region: Secondary | ICD-10-CM | POA: Diagnosis not present

## 2021-01-26 DIAGNOSIS — M5431 Sciatica, right side: Secondary | ICD-10-CM | POA: Diagnosis not present

## 2021-01-27 ENCOUNTER — Encounter: Payer: Self-pay | Admitting: *Deleted

## 2021-01-27 DIAGNOSIS — M25661 Stiffness of right knee, not elsewhere classified: Secondary | ICD-10-CM | POA: Diagnosis not present

## 2021-01-27 DIAGNOSIS — M25561 Pain in right knee: Secondary | ICD-10-CM | POA: Diagnosis not present

## 2021-01-29 DIAGNOSIS — M5431 Sciatica, right side: Secondary | ICD-10-CM | POA: Diagnosis not present

## 2021-01-29 DIAGNOSIS — M4306 Spondylolysis, lumbar region: Secondary | ICD-10-CM | POA: Diagnosis not present

## 2021-01-29 DIAGNOSIS — M9905 Segmental and somatic dysfunction of pelvic region: Secondary | ICD-10-CM | POA: Diagnosis not present

## 2021-01-29 DIAGNOSIS — M9903 Segmental and somatic dysfunction of lumbar region: Secondary | ICD-10-CM | POA: Diagnosis not present

## 2021-02-02 DIAGNOSIS — M9905 Segmental and somatic dysfunction of pelvic region: Secondary | ICD-10-CM | POA: Diagnosis not present

## 2021-02-02 DIAGNOSIS — M9903 Segmental and somatic dysfunction of lumbar region: Secondary | ICD-10-CM | POA: Diagnosis not present

## 2021-02-02 DIAGNOSIS — M4306 Spondylolysis, lumbar region: Secondary | ICD-10-CM | POA: Diagnosis not present

## 2021-02-02 DIAGNOSIS — M5431 Sciatica, right side: Secondary | ICD-10-CM | POA: Diagnosis not present

## 2021-02-03 DIAGNOSIS — M25661 Stiffness of right knee, not elsewhere classified: Secondary | ICD-10-CM | POA: Diagnosis not present

## 2021-02-03 DIAGNOSIS — M25561 Pain in right knee: Secondary | ICD-10-CM | POA: Diagnosis not present

## 2021-02-05 DIAGNOSIS — M9905 Segmental and somatic dysfunction of pelvic region: Secondary | ICD-10-CM | POA: Diagnosis not present

## 2021-02-05 DIAGNOSIS — M5431 Sciatica, right side: Secondary | ICD-10-CM | POA: Diagnosis not present

## 2021-02-05 DIAGNOSIS — M4306 Spondylolysis, lumbar region: Secondary | ICD-10-CM | POA: Diagnosis not present

## 2021-02-05 DIAGNOSIS — M9903 Segmental and somatic dysfunction of lumbar region: Secondary | ICD-10-CM | POA: Diagnosis not present

## 2021-02-12 DIAGNOSIS — M25561 Pain in right knee: Secondary | ICD-10-CM | POA: Diagnosis not present

## 2021-02-12 DIAGNOSIS — M5431 Sciatica, right side: Secondary | ICD-10-CM | POA: Diagnosis not present

## 2021-02-12 DIAGNOSIS — M4306 Spondylolysis, lumbar region: Secondary | ICD-10-CM | POA: Diagnosis not present

## 2021-02-12 DIAGNOSIS — M25661 Stiffness of right knee, not elsewhere classified: Secondary | ICD-10-CM | POA: Diagnosis not present

## 2021-02-12 DIAGNOSIS — M9903 Segmental and somatic dysfunction of lumbar region: Secondary | ICD-10-CM | POA: Diagnosis not present

## 2021-02-12 DIAGNOSIS — M9905 Segmental and somatic dysfunction of pelvic region: Secondary | ICD-10-CM | POA: Diagnosis not present

## 2021-02-19 DIAGNOSIS — M9903 Segmental and somatic dysfunction of lumbar region: Secondary | ICD-10-CM | POA: Diagnosis not present

## 2021-02-19 DIAGNOSIS — M25661 Stiffness of right knee, not elsewhere classified: Secondary | ICD-10-CM | POA: Diagnosis not present

## 2021-02-19 DIAGNOSIS — M5431 Sciatica, right side: Secondary | ICD-10-CM | POA: Diagnosis not present

## 2021-02-19 DIAGNOSIS — M9905 Segmental and somatic dysfunction of pelvic region: Secondary | ICD-10-CM | POA: Diagnosis not present

## 2021-02-19 DIAGNOSIS — M25561 Pain in right knee: Secondary | ICD-10-CM | POA: Diagnosis not present

## 2021-02-19 DIAGNOSIS — M4306 Spondylolysis, lumbar region: Secondary | ICD-10-CM | POA: Diagnosis not present

## 2021-02-25 DIAGNOSIS — M1711 Unilateral primary osteoarthritis, right knee: Secondary | ICD-10-CM | POA: Diagnosis not present

## 2021-03-03 DIAGNOSIS — M5431 Sciatica, right side: Secondary | ICD-10-CM | POA: Diagnosis not present

## 2021-03-03 DIAGNOSIS — M9903 Segmental and somatic dysfunction of lumbar region: Secondary | ICD-10-CM | POA: Diagnosis not present

## 2021-03-03 DIAGNOSIS — M9905 Segmental and somatic dysfunction of pelvic region: Secondary | ICD-10-CM | POA: Diagnosis not present

## 2021-03-03 DIAGNOSIS — M4306 Spondylolysis, lumbar region: Secondary | ICD-10-CM | POA: Diagnosis not present

## 2021-03-11 DIAGNOSIS — H52223 Regular astigmatism, bilateral: Secondary | ICD-10-CM | POA: Diagnosis not present

## 2021-03-26 DIAGNOSIS — M4306 Spondylolysis, lumbar region: Secondary | ICD-10-CM | POA: Diagnosis not present

## 2021-03-26 DIAGNOSIS — M9905 Segmental and somatic dysfunction of pelvic region: Secondary | ICD-10-CM | POA: Diagnosis not present

## 2021-03-26 DIAGNOSIS — M9903 Segmental and somatic dysfunction of lumbar region: Secondary | ICD-10-CM | POA: Diagnosis not present

## 2021-03-26 DIAGNOSIS — M5431 Sciatica, right side: Secondary | ICD-10-CM | POA: Diagnosis not present

## 2021-04-14 ENCOUNTER — Encounter: Payer: Self-pay | Admitting: Family

## 2021-04-14 NOTE — Telephone Encounter (Signed)
Patient scheduled Friday at 3p to discuss.

## 2021-04-16 ENCOUNTER — Other Ambulatory Visit: Payer: Self-pay

## 2021-04-16 ENCOUNTER — Encounter: Payer: Self-pay | Admitting: Dermatology

## 2021-04-16 ENCOUNTER — Other Ambulatory Visit: Payer: Self-pay | Admitting: Dermatology

## 2021-04-16 ENCOUNTER — Ambulatory Visit (INDEPENDENT_AMBULATORY_CARE_PROVIDER_SITE_OTHER): Payer: Medicare Other | Admitting: Dermatology

## 2021-04-16 DIAGNOSIS — L821 Other seborrheic keratosis: Secondary | ICD-10-CM

## 2021-04-16 DIAGNOSIS — Z1283 Encounter for screening for malignant neoplasm of skin: Secondary | ICD-10-CM

## 2021-04-16 DIAGNOSIS — H61002 Unspecified perichondritis of left external ear: Secondary | ICD-10-CM | POA: Diagnosis not present

## 2021-04-16 DIAGNOSIS — D225 Melanocytic nevi of trunk: Secondary | ICD-10-CM | POA: Diagnosis not present

## 2021-04-16 DIAGNOSIS — L578 Other skin changes due to chronic exposure to nonionizing radiation: Secondary | ICD-10-CM | POA: Diagnosis not present

## 2021-04-16 DIAGNOSIS — Z85828 Personal history of other malignant neoplasm of skin: Secondary | ICD-10-CM | POA: Diagnosis not present

## 2021-04-16 DIAGNOSIS — M5431 Sciatica, right side: Secondary | ICD-10-CM | POA: Diagnosis not present

## 2021-04-16 DIAGNOSIS — D229 Melanocytic nevi, unspecified: Secondary | ICD-10-CM

## 2021-04-16 DIAGNOSIS — M9905 Segmental and somatic dysfunction of pelvic region: Secondary | ICD-10-CM | POA: Diagnosis not present

## 2021-04-16 DIAGNOSIS — D489 Neoplasm of uncertain behavior, unspecified: Secondary | ICD-10-CM

## 2021-04-16 DIAGNOSIS — M4306 Spondylolysis, lumbar region: Secondary | ICD-10-CM | POA: Diagnosis not present

## 2021-04-16 DIAGNOSIS — D1800 Hemangioma unspecified site: Secondary | ICD-10-CM

## 2021-04-16 DIAGNOSIS — L814 Other melanin hyperpigmentation: Secondary | ICD-10-CM

## 2021-04-16 DIAGNOSIS — M9903 Segmental and somatic dysfunction of lumbar region: Secondary | ICD-10-CM | POA: Diagnosis not present

## 2021-04-16 DIAGNOSIS — D239 Other benign neoplasm of skin, unspecified: Secondary | ICD-10-CM

## 2021-04-16 HISTORY — DX: Other benign neoplasm of skin, unspecified: D23.9

## 2021-04-16 NOTE — Progress Notes (Signed)
Follow-Up Visit   Subjective  Krista Gentry is a 75 y.o. female who presents for the following: tbse (Patient has concerns today with spot on edge of left ear, noticed when laying on left side and has been bothering for several weeks. ).  Patient here for full body skin exam and skin cancer screening.  The following portions of the chart were reviewed this encounter and updated as appropriate:  Tobacco  Allergies  Meds  Problems  Med Hx  Surg Hx  Fam Hx      Objective  Well appearing patient in no apparent distress; mood and affect are within normal limits.  A full examination was performed including scalp, head, eyes, ears, nose, lips, neck, chest, axillae, abdomen, back, buttocks, bilateral upper extremities, bilateral lower extremities, hands, feet, fingers, toes, fingernails, and toenails. All findings within normal limits unless otherwise noted below.  Objective  right inferior buttock: 0.4 cm medium to dark brown papule      Assessment & Plan  Neoplasm of uncertain behavior right inferior buttock  Epidermal / dermal shaving  Lesion diameter (cm):  0.4 Informed consent: discussed and consent obtained   Timeout: patient name, date of birth, surgical site, and procedure verified   Patient was prepped and draped in usual sterile fashion: area prepped with isopropyl alcohol. Anesthesia: the lesion was anesthetized in a standard fashion   Anesthetic:  0.5% bupivicaine w/ epinephrine 1-100,000 local infiltration Instrument used: DermaBlade   Hemostasis achieved with: aluminum chloride   Outcome: patient tolerated procedure well   Post-procedure details: wound care instructions given   Additional details:  Mupirocin and a bandage applied  Specimen 1 - Surgical pathology Differential Diagnosis: r/o atypia  Check Margins: No 0.4 cm medium to dark brown papule  R/o atypia   chondrodermatitis nodularis helicis - left ear - no skin change, just tenderness  today - Benign appearing - recommend avoiding sleeping on that side, avoid pressure to ear   Lentigines - Scattered tan macules - Due to sun exposure - Benign-appering, observe - Recommend daily broad spectrum sunscreen SPF 30+ to sun-exposed areas, reapply every 2 hours as needed. - Call for any changes  Seborrheic Keratoses - Stuck-on, waxy, tan-brown papules and/or plaques  - Benign-appearing - Discussed benign etiology and prognosis. - Observe - Call for any changes  Melanocytic Nevi - Tan-brown and/or pink-flesh-colored symmetric macules and papules - Benign appearing on exam today - Observation - Call clinic for new or changing moles - Recommend daily use of broad spectrum spf 30+ sunscreen to sun-exposed areas.   Hemangiomas - Red papules - Discussed benign nature - Observe - Call for any changes  Actinic Damage - Chronic condition, secondary to cumulative UV/sun exposure - diffuse scaly erythematous macules with underlying dyspigmentation - Recommend daily broad spectrum sunscreen SPF 30+ to sun-exposed areas, reapply every 2 hours as needed.  - Staying in the shade or wearing long sleeves, sun glasses (UVA+UVB protection) and wide brim hats (4-inch brim around the entire circumference of the hat) are also recommended for sun protection.  - Call for new or changing lesions.  History of Basal Cell Carcinoma of the Skin - No evidence of recurrence today on right lateral calf - Recommend regular full body skin exams - Recommend daily broad spectrum sunscreen SPF 30+ to sun-exposed areas, reapply every 2 hours as needed.  - Call if any new or changing lesions are noted between office visits   Skin cancer screening performed today.  Return in  about 1 year (around 04/16/2022) for tbse.  I, Ruthell Rummage, CMA, am acting as scribe for [name], MD.  Documentation: I have reviewed the above documentation for accuracy and completeness, and I agree with the  above.  [name], MD

## 2021-04-16 NOTE — Patient Instructions (Addendum)
Seborrheic Keratosis  What causes seborrheic keratoses? Seborrheic keratoses are harmless, common skin growths that first appear during adult life.  As time goes by, more growths appear.  Some people may develop a large number of them.  Seborrheic keratoses appear on both covered and uncovered body parts.  They are not caused by sunlight.  The tendency to develop seborrheic keratoses can be inherited.  They vary in color from skin-colored to gray, brown, or even black.  They can be either smooth or have a rough, warty surface.   Seborrheic keratoses are superficial and look as if they were stuck on the skin.  Under the microscope this type of keratosis looks like layers upon layers of skin.  That is why at times the top layer may seem to fall off, but the rest of the growth remains and re-grows.    Treatment Seborrheic keratoses do not need to be treated, but can easily be removed in the office.  Seborrheic keratoses often cause symptoms when they rub on clothing or jewelry.  Lesions can be in the way of shaving.  If they become inflamed, they can cause itching, soreness, or burning.  Removal of a seborrheic keratosis can be accomplished by freezing, burning, or surgery. If any spot bleeds, scabs, or grows rapidly, please return to have it checked, as these can be an indication of a skin cancer.  Melanoma ABCDEs  Melanoma is the most dangerous type of skin cancer, and is the leading cause of death from skin disease.  You are more likely to develop melanoma if you:  Have light-colored skin, light-colored eyes, or red or blond hair  Spend a lot of time in the sun  Tan regularly, either outdoors or in a tanning bed  Have had blistering sunburns, especially during childhood  Have a close family member who has had a melanoma  Have atypical moles or large birthmarks  Early detection of melanoma is key since treatment is typically straightforward and cure rates are extremely high if we catch it  early.   The first sign of melanoma is often a change in a mole or a new dark spot.  The ABCDE system is a way of remembering the signs of melanoma.  A for asymmetry:  The two halves do not match. B for border:  The edges of the growth are irregular. C for color:  A mixture of colors are present instead of an even brown color. D for diameter:  Melanomas are usually (but not always) greater than 3mm - the size of a pencil eraser. E for evolution:  The spot keeps changing in size, shape, and color.  Please check your skin once per month between visits. You can use a small mirror in front and a large mirror behind you to keep an eye on the back side or your body.   If you see any new or changing lesions before your next follow-up, please call to schedule a visit.  Please continue daily skin protection including broad spectrum sunscreen SPF 30+ to sun-exposed areas, reapplying every 2 hours as needed when you're outdoors.   Staying in the shade or wearing long sleeves, sun glasses (UVA+UVB protection) and wide brim hats (4-inch brim around the entire circumference of the hat) are also recommended for sun protection.   Recommend taking Heliocare sun protection supplement daily in sunny weather for additional sun protection. For maximum protection on the sunniest days, you can take up to 2 capsules of regular Heliocare OR take  1 capsule of Heliocare Ultra. For prolonged exposure (such as a full day in the sun), you can repeat your dose of the supplement 4 hours after your first dose. Heliocare can be purchased at [facility] or at [email or web address].   Biopsy Wound Care Instructions  1. Leave the original bandage on for 24 hours if possible.  If the bandage becomes soaked or soiled before that time, it is OK to remove it and examine the wound.  A small amount of post-operative bleeding is normal.  If excessive bleeding occurs, remove the bandage, place gauze over the site and apply continuous  pressure (no peeking) over the area for 30 minutes. If this does not work, please call our clinic as soon as possible or page your doctor if it is after hours.   2. Once a day, cleanse the wound with soap and water. It is fine to shower. If a thick crust develops you may use a Q-tip dipped into dilute hydrogen peroxide (mix 1:1 with water) to dissolve it.  Hydrogen peroxide can slow the healing process, so use it only as needed.    3. After washing, apply petroleum jelly (Vaseline) or an antibiotic ointment if your doctor prescribed one for you, followed by a bandage.    4. For best healing, the wound should be covered with a layer of ointment at all times. If you are not able to keep the area covered with a bandage to hold the ointment in place, this may mean re-applying the ointment several times a day.  Continue this wound care until the wound has healed and is no longer open.   Itching and mild discomfort is normal during the healing process. However, if you develop pain or severe itching, please call our office.   If you have any discomfort, you can take Tylenol (acetaminophen) or ibuprofen as directed on the bottle. (Please do not take these if you have an allergy to them or cannot take them for another reason).  Some redness, tenderness and white or yellow material in the wound is normal healing.  If the area becomes very sore and red, or develops a thick yellow-green material (pus), it may be infected; please notify us.    If you have stitches, return to clinic as directed to have the stitches removed. You will continue wound care for 2-3 days after the stitches are removed.   Wound healing continues for up to one year following surgery. It is not unusual to experience pain in the scar from time to time during the interval.  If the pain becomes severe or the scar thickens, you should notify the office.    A slight amount of redness in a scar is expected for the first six months.  After six  months, the redness will fade and the scar will soften and fade.  The color difference becomes less noticeable with time.  If there are any problems, return for a post-op surgery check at your earliest convenience.  To improve the appearance of the scar, you can use silicone scar gel, cream, or sheets (such as Mederma or Serica) every night for up to one year. These are available over the counter (without a prescription).  Please call our office at [phone] for any questions or concerns.

## 2021-04-17 ENCOUNTER — Ambulatory Visit (INDEPENDENT_AMBULATORY_CARE_PROVIDER_SITE_OTHER): Payer: Medicare Other | Admitting: Family

## 2021-04-17 ENCOUNTER — Encounter: Payer: Self-pay | Admitting: Family

## 2021-04-17 DIAGNOSIS — R5383 Other fatigue: Secondary | ICD-10-CM

## 2021-04-17 DIAGNOSIS — I10 Essential (primary) hypertension: Secondary | ICD-10-CM | POA: Diagnosis not present

## 2021-04-17 NOTE — Assessment & Plan Note (Signed)
Controlled without medication.  Will follow. 

## 2021-04-17 NOTE — Progress Notes (Signed)
Subjective:    Patient ID: Krista Gentry, female    DOB: 21-Apr-1946, 75 y.o.   MRN: [id number]  CC: Krista Gentry is a 75 y.o. female who presents today for follow up.   HPI: Primary concern of depression and fatigue  She is more tearful.  Less motivation.  She has trouble falling and staying asleep. Endorses nocturia. She doesn't snore .  No si/hi  Following with Dr Nicola Police for right knee pain and recent carpal tunnel release 10/2020. She has steroid injection. She has started tart cherry, CBD oil with some relief.   No h/o seizure, anorexia, bulimia. No heavy alcohol use.   Mammogram UTD Cologuard UTD 10/21/20  HISTORY:  Past Medical History:  Diagnosis Date  . Allergy   . Basal cell carcinoma 10/08/2020   right lateral calf  . Family history of adverse reaction to anesthesia    sister had difficult waking up from anesthesia . had pseudo cholinestrase   Past Surgical History:  Procedure Laterality Date  . ABDOMINAL HYSTERECTOMY  1994   for noncancerous reason, fibroids. She has bilateral oophorectomy.  Marland Kitchen BILATERAL OOPHORECTOMY    . BREAST BIOPSY    . CARPAL TUNNEL RELEASE Right 10/23/2020   Procedure: RIGHT CARPAL TUNNEL RELEASE and LEFT THUMB TRIGGER INJECTION;  Surgeon: Thornton Park, MD;  Location: [facility] ORS;  Service: Orthopedics;  Laterality: Right;  . CHOLECYSTECTOMY  1994   Family History  Problem Relation Age of Onset  . Kidney disease Mother        nephrolithiasis  . Dementia Mother   . Hypertension Mother   . Kidney Stones Mother   . Mental retardation Sister 8       possible autism,  now with depression  . Dementia Sister   . Cancer Maternal Aunt        breast cancer  . Dementia Maternal Aunt   . Mental retardation Maternal Aunt   . Breast cancer Maternal Aunt   . Dementia Maternal Uncle   . Cancer Maternal Grandmother        breast cancer  . Breast cancer Maternal Grandmother   . Heart attack Paternal Grandmother   . Kidney Stones Brother    . Cancer Maternal Grandfather        Stomach cancer  . Colon cancer Neg Hx     Allergies: Patient has no known allergies. Current Outpatient Medications on File Prior to Visit  Medication Sig Dispense Refill  . acetaminophen (TYLENOL) 325 MG tablet Take 1-2 tablets (325-650 mg total) by mouth every 6 (six) hours as needed. 60 tablet 1  . ACETYLCARNITINE PO Take 1,500 mg by mouth.    Jolyne Loa Grape-Goldenseal (BERBERINE COMPLEX PO) Take 500 mcg by mouth daily.    . cetirizine (ZYRTEC) 10 MG tablet Take 10 mg by mouth daily as needed for allergies.     . Cholecalciferol (VITAMIN D) 50 MCG (2000 UT) tablet Take 6,000 Units by mouth daily.    Marland Kitchen CRANBERRY EXTRACT PO Take 4,200 mg by mouth daily.     . Cyanocobalamin (B-12) 5000 MCG SUBL Place 5,000 mcg under the tongue daily.    . melatonin 5 MG TABS Take 5 mg by mouth.    . Menaquinone-7 (VITAMIN K2 PO) Take 600 mcg by mouth daily.    . Multiple Minerals-Vitamins (CITRACAL MAXIMUM PLUS) TABS Take 2 tablets by mouth daily.     . Multiple Vitamins-Minerals (IMMUNE SUPPORT PO) Take 2 tablets by mouth daily.    Marland Kitchen  mupirocin ointment (BACTROBAN) 2 % Apply 1 application topically daily. 22 g 0  . OVER THE COUNTER MEDICATION Take 1 tablet by mouth daily. Nutratherm otc supplement    . Potassium 99 MG TABS Take 99 mg by mouth daily.     . Quercetin 250 MG TABS Take 250 mg by mouth daily.     . TURMERIC-GINGER PO Take by mouth.    Marland Kitchen UNABLE TO FIND cbd 20 mg    . Zinc 50 MG CAPS Take 50 mg by mouth daily.      No current facility-administered medications on file prior to visit.    Social History   Tobacco Use  . Smoking status: Former Smoker    Packs/day: 1.00    Years: 5.00    Pack years: 5.00    Types: Cigarettes    Quit date: 04/14/1970    Years since quitting: 51.0  . Smokeless tobacco: Never Used  Vaping Use  . Vaping Use: Never used  Substance Use Topics  . Alcohol use: Yes    Alcohol/week: 3.0 standard drinks    Types:  2 Glasses of wine, 1 Cans of beer per week    Comment: occassion  . Drug use: No    Review of Systems  Constitutional: Positive for fatigue. Negative for chills, fever and unexpected weight change.  Respiratory: Negative for cough.   Cardiovascular: Negative for chest pain and palpitations.  Gastrointestinal: Negative for nausea and vomiting.      Objective:    BP 124/72 (BP Location: Left Arm, Patient Position: Sitting, Cuff Size: Large)   Pulse 77   Temp 98.2 F (36.8 C) (Oral)   Ht 5\' 4"  (1.626 m)   Wt 234 lb (106.1 kg)   SpO2 99%   BMI 40.17 kg/m  BP Readings from Last 3 Encounters:  04/17/21 124/72  10/23/20 (!) 132/56  09/08/20 120/82   Wt Readings from Last 3 Encounters:  04/17/21 234 lb (106.1 kg)  10/15/20 223 lb (101.2 kg)  10/03/20 227 lb (103 kg)    Physical Exam Vitals reviewed.  Constitutional:      Appearance: She is well-developed.  Eyes:     Conjunctiva/sclera: Conjunctivae normal.  Cardiovascular:     Rate and Rhythm: Normal rate and regular rhythm.     Pulses: Normal pulses.     Heart sounds: Normal heart sounds.  Pulmonary:     Effort: Pulmonary effort is normal.     Breath sounds: Normal breath sounds. No wheezing, rhonchi or rales.  Skin:    General: Skin is warm and dry.  Neurological:     Mental Status: She is alert.  Psychiatric:        Speech: Speech normal.        Behavior: Behavior normal.        Thought Content: Thought content normal.        Assessment & Plan:   Problem List Items Addressed This Visit      Cardiovascular and Mediastinum   HTN (hypertension)    Controlled without medication. Will follow        Other   Fatigue    Suspect multifactorial including depression, weight gain. Discussed trial of cymbalta, prozac, or wellbutrin. She would like to start with labs and consider a medication.       Relevant Orders   Hemoglobin A1c   B12 and Folate Panel   CBC with Differential/Platelet   Comprehensive  metabolic panel   TSH   VITAMIN D 25  Hydroxy (Vit-D Deficiency, Fractures)   Iron, TIBC and Ferritin Panel       I am having Krista Gentry. Bachar maintain her CRANBERRY EXTRACT PO, cetirizine, Zinc, Vitamin D, Menaquinone-7 (VITAMIN K2 PO), Potassium, Quercetin, Citracal Maximum Plus, mupirocin ointment, Multiple Vitamins-Minerals (IMMUNE SUPPORT PO), OVER THE COUNTER MEDICATION, B-12, Barberry-Oreg Grape-Goldenseal (BERBERINE COMPLEX PO), TURMERIC-GINGER PO, melatonin, ACETYLCARNITINE PO, acetaminophen, and UNABLE TO FIND.   No orders of the defined types were placed in this encounter.   Return precautions given.   Risks, benefits, and alternatives of the medications and treatment plan prescribed today were discussed, and patient expressed understanding.   Education regarding symptom management and diagnosis given to patient on AVS.  Continue to follow with Burnard Hawthorne, FNP for routine health maintenance.   Johney Frame and I agreed with plan.   Mable Paris, FNP

## 2021-04-17 NOTE — Patient Instructions (Addendum)
Consider cymbalta, wellbutrin or prozac.  Let me know what you are thinking.

## 2021-04-17 NOTE — Assessment & Plan Note (Signed)
Suspect multifactorial including depression, weight gain. Discussed trial of cymbalta, prozac, or wellbutrin. She would like to start with labs and consider a medication.

## 2021-04-18 ENCOUNTER — Encounter: Payer: Self-pay | Admitting: Family

## 2021-04-21 NOTE — Progress Notes (Signed)
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 On the spectrum from normal mole to melanoma skin cancer, this is in between the two. - We need to recheck this area sometime in the next 6 months to be sure there is no evidence of the atypical mole coming back. If there is any color coming back, we would recommend repeating the biopsy to be sure the cells look normal.  - People who have a history of atypical moles do have a slightly increased risk of developing melanoma somewhere on the body, so a yearly full body skin exam by a dermatologist is recommended.  - Monthly self skin checks and daily sun protection are also recommended.  - Please call if you notice a dark spot coming back where this biopsy was taken.  - Please also call if you notice any new or changing spots anywhere else on the body before your follow-up visit.   Dr. Laurence Ferrari discussed with patient 04/20/2021 at 1:50 PM. All questions answered. Will recheck site at follow-up in October.

## 2021-04-22 ENCOUNTER — Other Ambulatory Visit: Payer: Self-pay

## 2021-04-22 DIAGNOSIS — R7989 Other specified abnormal findings of blood chemistry: Secondary | ICD-10-CM

## 2021-04-23 NOTE — Addendum Note (Signed)
Addended by: Leeanne Rio on: 04/23/2021 08:46 AM   Modules accepted: Orders

## 2021-04-24 ENCOUNTER — Telehealth: Payer: Self-pay

## 2021-04-24 LAB — COMPREHENSIVE METABOLIC PANEL
AG Ratio: 1.4 (calc) (ref 1.0–2.5)
ALT: 10 U/L (ref 6–29)
AST: 13 U/L (ref 10–35)
Albumin: 4 g/dL (ref 3.6–5.1)
Alkaline phosphatase (APISO): 59 U/L (ref 37–153)
BUN/Creatinine Ratio: 19 (calc) (ref 6–22)
BUN: 19 mg/dL (ref 7–25)
CO2: 31 mmol/L (ref 20–32)
Calcium: 9.9 mg/dL (ref 8.6–10.4)
Chloride: 100 mmol/L (ref 98–110)
Creat: 0.98 mg/dL — ABNORMAL HIGH (ref 0.60–0.93)
Globulin: 2.8 g/dL (calc) (ref 1.9–3.7)
Glucose, Bld: 82 mg/dL (ref 65–99)
Potassium: 4.2 mmol/L (ref 3.5–5.3)
Sodium: 139 mmol/L (ref 135–146)
Total Bilirubin: 0.3 mg/dL (ref 0.2–1.2)
Total Protein: 6.8 g/dL (ref 6.1–8.1)

## 2021-04-24 LAB — IRON,TIBC AND FERRITIN PANEL
%SAT: 12 % (calc) — ABNORMAL LOW (ref 16–45)
Ferritin: 66 ng/mL (ref 16–288)
Iron: 41 ug/dL — ABNORMAL LOW (ref 45–160)
TIBC: 334 mcg/dL (calc) (ref 250–450)

## 2021-04-24 LAB — CBC WITH DIFFERENTIAL/PLATELET
Absolute Monocytes: 599 cells/uL (ref 200–950)
Basophils Absolute: 58 cells/uL (ref 0–200)
Basophils Relative: 0.8 %
Eosinophils Absolute: 161 cells/uL (ref 15–500)
Eosinophils Relative: 2.2 %
HCT: 39.2 % (ref 35.0–45.0)
Hemoglobin: 12.5 g/dL (ref 11.7–15.5)
Lymphs Abs: 1526 cells/uL (ref 850–3900)
MCH: 27.7 pg (ref 27.0–33.0)
MCHC: 31.9 g/dL — ABNORMAL LOW (ref 32.0–36.0)
MCV: 86.7 fL (ref 80.0–100.0)
MPV: 12.7 fL — ABNORMAL HIGH (ref 7.5–12.5)
Monocytes Relative: 8.2 %
Neutro Abs: 4957 cells/uL (ref 1500–7800)
Neutrophils Relative %: 67.9 %
Platelets: 261 10*3/uL (ref 140–400)
RBC: 4.52 10*6/uL (ref 3.80–5.10)
RDW: 12.9 % (ref 11.0–15.0)
Total Lymphocyte: 20.9 %
WBC: 7.3 10*3/uL (ref 3.8–10.8)

## 2021-04-24 LAB — TSH: TSH: 2.9 mIU/L (ref 0.40–4.50)

## 2021-04-24 LAB — B12 AND FOLATE PANEL
Folate: >24 ng/mL
Vitamin B-12: 482 pg/mL (ref 200–1100)

## 2021-04-24 LAB — TEST AUTHORIZATION

## 2021-04-24 LAB — VITAMIN D 25 HYDROXY (VIT D DEFICIENCY, FRACTURES): Vit D, 25-Hydroxy: 30 ng/mL (ref 30–100)

## 2021-04-24 LAB — HEMOGLOBIN A1C W/OUT EAG: Hgb A1c MFr Bld: 5.1 % of total Hgb (ref <5.7)

## 2021-04-24 NOTE — Telephone Encounter (Signed)
Left detailed message for patient to call back & get scheduled for one month of B-12 injections then once a month after that. I advised that she needs to call back if she does not feel improvement with a month of B-12 & we will need to stop.

## 2021-04-28 ENCOUNTER — Ambulatory Visit (INDEPENDENT_AMBULATORY_CARE_PROVIDER_SITE_OTHER): Payer: Medicare Other

## 2021-04-28 ENCOUNTER — Other Ambulatory Visit: Payer: Self-pay

## 2021-04-28 DIAGNOSIS — E538 Deficiency of other specified B group vitamins: Secondary | ICD-10-CM

## 2021-04-28 MED ORDER — CYANOCOBALAMIN 1000 MCG/ML IJ SOLN
1000.0000 ug | Freq: Once | INTRAMUSCULAR | Status: AC
  Administered 2021-04-28: 1000 ug via INTRAMUSCULAR

## 2021-04-28 NOTE — Progress Notes (Signed)
Patient presented for B 12 injection to right deltoid, patient voiced no concerns nor showed any signs of distress during injection. 

## 2021-04-30 ENCOUNTER — Telehealth: Payer: Self-pay | Admitting: Family

## 2021-04-30 NOTE — Telephone Encounter (Signed)
I have filled out & placed in red folder.

## 2021-04-30 NOTE — Telephone Encounter (Signed)
Patient would like to know if Krista Gentry would fill out a handicapp plaque form for her knee.

## 2021-05-01 NOTE — Telephone Encounter (Signed)
Pt notified that placard filled out & placed up front to pick up.

## 2021-05-04 ENCOUNTER — Other Ambulatory Visit: Payer: Medicare Other

## 2021-05-06 ENCOUNTER — Other Ambulatory Visit (INDEPENDENT_AMBULATORY_CARE_PROVIDER_SITE_OTHER): Payer: Medicare Other

## 2021-05-06 ENCOUNTER — Other Ambulatory Visit: Payer: Self-pay

## 2021-05-06 ENCOUNTER — Ambulatory Visit (INDEPENDENT_AMBULATORY_CARE_PROVIDER_SITE_OTHER): Payer: Medicare Other | Admitting: *Deleted

## 2021-05-06 DIAGNOSIS — R7989 Other specified abnormal findings of blood chemistry: Secondary | ICD-10-CM

## 2021-05-06 DIAGNOSIS — E538 Deficiency of other specified B group vitamins: Secondary | ICD-10-CM | POA: Diagnosis not present

## 2021-05-06 LAB — BASIC METABOLIC PANEL
BUN: 14 mg/dL (ref 6–23)
CO2: 30 mEq/L (ref 19–32)
Calcium: 9.6 mg/dL (ref 8.4–10.5)
Chloride: 102 mEq/L (ref 96–112)
Creatinine, Ser: 0.97 mg/dL (ref 0.40–1.20)
GFR: 57.58 mL/min — ABNORMAL LOW (ref >60.00)
Glucose, Bld: 99 mg/dL (ref 70–99)
Potassium: 5.1 mEq/L (ref 3.5–5.1)
Sodium: 139 mEq/L (ref 135–145)

## 2021-05-06 MED ORDER — CYANOCOBALAMIN 1000 MCG/ML IJ SOLN
1000.0000 ug | Freq: Once | INTRAMUSCULAR | Status: AC
  Administered 2021-05-06: 1000 ug via INTRAMUSCULAR

## 2021-05-06 NOTE — Progress Notes (Signed)
Patient presented for B 12 injection to right deltoid, patient voiced no concerns nor showed any signs of distress during injection. 

## 2021-05-11 DIAGNOSIS — M4306 Spondylolysis, lumbar region: Secondary | ICD-10-CM | POA: Diagnosis not present

## 2021-05-11 DIAGNOSIS — M9905 Segmental and somatic dysfunction of pelvic region: Secondary | ICD-10-CM | POA: Diagnosis not present

## 2021-05-11 DIAGNOSIS — M5431 Sciatica, right side: Secondary | ICD-10-CM | POA: Diagnosis not present

## 2021-05-11 DIAGNOSIS — M9903 Segmental and somatic dysfunction of lumbar region: Secondary | ICD-10-CM | POA: Diagnosis not present

## 2021-05-13 ENCOUNTER — Ambulatory Visit: Payer: Medicare Other

## 2021-05-13 ENCOUNTER — Other Ambulatory Visit: Payer: Self-pay

## 2021-05-13 ENCOUNTER — Ambulatory Visit (INDEPENDENT_AMBULATORY_CARE_PROVIDER_SITE_OTHER): Payer: Medicare Other

## 2021-05-13 DIAGNOSIS — E538 Deficiency of other specified B group vitamins: Secondary | ICD-10-CM

## 2021-05-13 MED ORDER — CYANOCOBALAMIN 1000 MCG/ML IJ SOLN
1000.0000 ug | Freq: Once | INTRAMUSCULAR | Status: AC
  Administered 2021-05-13: 1000 ug via INTRAMUSCULAR

## 2021-05-13 NOTE — Progress Notes (Signed)
Patient presented for B 12 injection to left deltoid, patient voiced no concerns nor showed any signs of distress during injection. 

## 2021-06-08 DIAGNOSIS — M9903 Segmental and somatic dysfunction of lumbar region: Secondary | ICD-10-CM | POA: Diagnosis not present

## 2021-06-08 DIAGNOSIS — M5431 Sciatica, right side: Secondary | ICD-10-CM | POA: Diagnosis not present

## 2021-06-08 DIAGNOSIS — M4306 Spondylolysis, lumbar region: Secondary | ICD-10-CM | POA: Diagnosis not present

## 2021-06-08 DIAGNOSIS — M9905 Segmental and somatic dysfunction of pelvic region: Secondary | ICD-10-CM | POA: Diagnosis not present

## 2021-06-16 ENCOUNTER — Ambulatory Visit (INDEPENDENT_AMBULATORY_CARE_PROVIDER_SITE_OTHER): Payer: Medicare Other

## 2021-06-16 ENCOUNTER — Other Ambulatory Visit: Payer: Self-pay

## 2021-06-16 DIAGNOSIS — E538 Deficiency of other specified B group vitamins: Secondary | ICD-10-CM

## 2021-06-16 MED ORDER — CYANOCOBALAMIN 1000 MCG/ML IJ SOLN
1000.0000 ug | Freq: Once | INTRAMUSCULAR | Status: AC
  Administered 2021-06-16: 1000 ug via INTRAMUSCULAR

## 2021-06-16 NOTE — Progress Notes (Signed)
Patient presented for B 12 injection to left deltoid, patient voiced no concerns nor showed any signs of distress during injection. 

## 2021-07-08 DIAGNOSIS — M5431 Sciatica, right side: Secondary | ICD-10-CM | POA: Diagnosis not present

## 2021-07-08 DIAGNOSIS — M189 Osteoarthritis of first carpometacarpal joint, unspecified: Secondary | ICD-10-CM | POA: Diagnosis not present

## 2021-07-08 DIAGNOSIS — M1812 Unilateral primary osteoarthritis of first carpometacarpal joint, left hand: Secondary | ICD-10-CM | POA: Diagnosis not present

## 2021-07-08 DIAGNOSIS — M9903 Segmental and somatic dysfunction of lumbar region: Secondary | ICD-10-CM | POA: Diagnosis not present

## 2021-07-08 DIAGNOSIS — M4306 Spondylolysis, lumbar region: Secondary | ICD-10-CM | POA: Diagnosis not present

## 2021-07-08 DIAGNOSIS — M9905 Segmental and somatic dysfunction of pelvic region: Secondary | ICD-10-CM | POA: Diagnosis not present

## 2021-07-08 DIAGNOSIS — M1711 Unilateral primary osteoarthritis, right knee: Secondary | ICD-10-CM | POA: Diagnosis not present

## 2021-07-13 DIAGNOSIS — M1711 Unilateral primary osteoarthritis, right knee: Secondary | ICD-10-CM | POA: Diagnosis not present

## 2021-07-16 ENCOUNTER — Ambulatory Visit (INDEPENDENT_AMBULATORY_CARE_PROVIDER_SITE_OTHER): Payer: Medicare Other

## 2021-07-16 ENCOUNTER — Other Ambulatory Visit: Payer: Self-pay

## 2021-07-16 DIAGNOSIS — E538 Deficiency of other specified B group vitamins: Secondary | ICD-10-CM

## 2021-07-16 MED ORDER — CYANOCOBALAMIN 1000 MCG/ML IJ SOLN
1000.0000 ug | Freq: Once | INTRAMUSCULAR | Status: AC
  Administered 2021-07-16: 1000 ug via INTRAMUSCULAR

## 2021-07-16 NOTE — Progress Notes (Signed)
Patient presented for B 12 injection to left deltoid, patient voiced no concerns nor showed any signs of distress during injection. 

## 2021-07-20 DIAGNOSIS — M1711 Unilateral primary osteoarthritis, right knee: Secondary | ICD-10-CM | POA: Diagnosis not present

## 2021-07-22 DIAGNOSIS — M5431 Sciatica, right side: Secondary | ICD-10-CM | POA: Diagnosis not present

## 2021-07-22 DIAGNOSIS — M9903 Segmental and somatic dysfunction of lumbar region: Secondary | ICD-10-CM | POA: Diagnosis not present

## 2021-07-22 DIAGNOSIS — M4306 Spondylolysis, lumbar region: Secondary | ICD-10-CM | POA: Diagnosis not present

## 2021-07-22 DIAGNOSIS — M9905 Segmental and somatic dysfunction of pelvic region: Secondary | ICD-10-CM | POA: Diagnosis not present

## 2021-07-23 DIAGNOSIS — M9903 Segmental and somatic dysfunction of lumbar region: Secondary | ICD-10-CM | POA: Diagnosis not present

## 2021-07-23 DIAGNOSIS — M9905 Segmental and somatic dysfunction of pelvic region: Secondary | ICD-10-CM | POA: Diagnosis not present

## 2021-07-23 DIAGNOSIS — M4306 Spondylolysis, lumbar region: Secondary | ICD-10-CM | POA: Diagnosis not present

## 2021-07-23 DIAGNOSIS — M5431 Sciatica, right side: Secondary | ICD-10-CM | POA: Diagnosis not present

## 2021-07-25 DIAGNOSIS — M4306 Spondylolysis, lumbar region: Secondary | ICD-10-CM | POA: Diagnosis not present

## 2021-07-25 DIAGNOSIS — M5431 Sciatica, right side: Secondary | ICD-10-CM | POA: Diagnosis not present

## 2021-07-25 DIAGNOSIS — M9903 Segmental and somatic dysfunction of lumbar region: Secondary | ICD-10-CM | POA: Diagnosis not present

## 2021-07-25 DIAGNOSIS — M9905 Segmental and somatic dysfunction of pelvic region: Secondary | ICD-10-CM | POA: Diagnosis not present

## 2021-07-27 DIAGNOSIS — M5431 Sciatica, right side: Secondary | ICD-10-CM | POA: Diagnosis not present

## 2021-07-27 DIAGNOSIS — M9903 Segmental and somatic dysfunction of lumbar region: Secondary | ICD-10-CM | POA: Diagnosis not present

## 2021-07-27 DIAGNOSIS — M4306 Spondylolysis, lumbar region: Secondary | ICD-10-CM | POA: Diagnosis not present

## 2021-07-27 DIAGNOSIS — M9905 Segmental and somatic dysfunction of pelvic region: Secondary | ICD-10-CM | POA: Diagnosis not present

## 2021-07-29 DIAGNOSIS — M4306 Spondylolysis, lumbar region: Secondary | ICD-10-CM | POA: Diagnosis not present

## 2021-07-29 DIAGNOSIS — M9905 Segmental and somatic dysfunction of pelvic region: Secondary | ICD-10-CM | POA: Diagnosis not present

## 2021-07-29 DIAGNOSIS — M5431 Sciatica, right side: Secondary | ICD-10-CM | POA: Diagnosis not present

## 2021-07-29 DIAGNOSIS — M9903 Segmental and somatic dysfunction of lumbar region: Secondary | ICD-10-CM | POA: Diagnosis not present

## 2021-07-31 DIAGNOSIS — M9905 Segmental and somatic dysfunction of pelvic region: Secondary | ICD-10-CM | POA: Diagnosis not present

## 2021-07-31 DIAGNOSIS — M4306 Spondylolysis, lumbar region: Secondary | ICD-10-CM | POA: Diagnosis not present

## 2021-07-31 DIAGNOSIS — M9903 Segmental and somatic dysfunction of lumbar region: Secondary | ICD-10-CM | POA: Diagnosis not present

## 2021-07-31 DIAGNOSIS — M5431 Sciatica, right side: Secondary | ICD-10-CM | POA: Diagnosis not present

## 2021-08-12 DIAGNOSIS — M9903 Segmental and somatic dysfunction of lumbar region: Secondary | ICD-10-CM | POA: Diagnosis not present

## 2021-08-12 DIAGNOSIS — M4306 Spondylolysis, lumbar region: Secondary | ICD-10-CM | POA: Diagnosis not present

## 2021-08-12 DIAGNOSIS — M5431 Sciatica, right side: Secondary | ICD-10-CM | POA: Diagnosis not present

## 2021-08-12 DIAGNOSIS — M9905 Segmental and somatic dysfunction of pelvic region: Secondary | ICD-10-CM | POA: Diagnosis not present

## 2021-08-14 DIAGNOSIS — M4306 Spondylolysis, lumbar region: Secondary | ICD-10-CM | POA: Diagnosis not present

## 2021-08-14 DIAGNOSIS — M5431 Sciatica, right side: Secondary | ICD-10-CM | POA: Diagnosis not present

## 2021-08-14 DIAGNOSIS — M9903 Segmental and somatic dysfunction of lumbar region: Secondary | ICD-10-CM | POA: Diagnosis not present

## 2021-08-14 DIAGNOSIS — M9905 Segmental and somatic dysfunction of pelvic region: Secondary | ICD-10-CM | POA: Diagnosis not present

## 2021-08-18 ENCOUNTER — Ambulatory Visit (INDEPENDENT_AMBULATORY_CARE_PROVIDER_SITE_OTHER): Payer: Medicare Other

## 2021-08-18 ENCOUNTER — Other Ambulatory Visit: Payer: Self-pay

## 2021-08-18 DIAGNOSIS — M9903 Segmental and somatic dysfunction of lumbar region: Secondary | ICD-10-CM | POA: Diagnosis not present

## 2021-08-18 DIAGNOSIS — M4306 Spondylolysis, lumbar region: Secondary | ICD-10-CM | POA: Diagnosis not present

## 2021-08-18 DIAGNOSIS — M5431 Sciatica, right side: Secondary | ICD-10-CM | POA: Diagnosis not present

## 2021-08-18 DIAGNOSIS — M9905 Segmental and somatic dysfunction of pelvic region: Secondary | ICD-10-CM | POA: Diagnosis not present

## 2021-08-18 DIAGNOSIS — E538 Deficiency of other specified B group vitamins: Secondary | ICD-10-CM | POA: Diagnosis not present

## 2021-08-18 MED ORDER — CYANOCOBALAMIN 1000 MCG/ML IJ SOLN
1000.0000 ug | Freq: Once | INTRAMUSCULAR | Status: AC
  Administered 2021-08-18: 1000 ug via INTRAMUSCULAR

## 2021-08-18 NOTE — Progress Notes (Signed)
Patient presented for B 12 injection to left deltoid, patient voiced no concerns nor showed any signs of distress during injection. 

## 2021-08-20 DIAGNOSIS — M9905 Segmental and somatic dysfunction of pelvic region: Secondary | ICD-10-CM | POA: Diagnosis not present

## 2021-08-20 DIAGNOSIS — M4306 Spondylolysis, lumbar region: Secondary | ICD-10-CM | POA: Diagnosis not present

## 2021-08-20 DIAGNOSIS — M9903 Segmental and somatic dysfunction of lumbar region: Secondary | ICD-10-CM | POA: Diagnosis not present

## 2021-08-20 DIAGNOSIS — M5431 Sciatica, right side: Secondary | ICD-10-CM | POA: Diagnosis not present

## 2021-08-25 ENCOUNTER — Encounter: Payer: Self-pay | Admitting: Family

## 2021-08-25 ENCOUNTER — Telehealth: Payer: Self-pay | Admitting: Family

## 2021-08-25 DIAGNOSIS — E538 Deficiency of other specified B group vitamins: Secondary | ICD-10-CM | POA: Insufficient documentation

## 2021-08-25 NOTE — Telephone Encounter (Signed)
Call patient Please advise it is okay that she continue B12 monthly injections  and schedule if she is not already scheduled.  I want to order B12 levels since we have not done this since the springtime.  Also included further studies to evaluate for other causes of low B12 such as pernicious anemia.    I Have ordered all these labs - please schedule these in a couple weeks.

## 2021-08-25 NOTE — Telephone Encounter (Signed)
LMTCB

## 2021-08-26 DIAGNOSIS — M4306 Spondylolysis, lumbar region: Secondary | ICD-10-CM | POA: Diagnosis not present

## 2021-08-26 DIAGNOSIS — M5431 Sciatica, right side: Secondary | ICD-10-CM | POA: Diagnosis not present

## 2021-08-26 DIAGNOSIS — M9903 Segmental and somatic dysfunction of lumbar region: Secondary | ICD-10-CM | POA: Diagnosis not present

## 2021-08-26 DIAGNOSIS — M9905 Segmental and somatic dysfunction of pelvic region: Secondary | ICD-10-CM | POA: Diagnosis not present

## 2021-08-31 ENCOUNTER — Encounter: Payer: Self-pay | Admitting: Family

## 2021-08-31 ENCOUNTER — Ambulatory Visit (INDEPENDENT_AMBULATORY_CARE_PROVIDER_SITE_OTHER): Payer: Medicare Other | Admitting: Family

## 2021-08-31 ENCOUNTER — Other Ambulatory Visit: Payer: Self-pay

## 2021-08-31 VITALS — BP 122/72 | HR 70 | Temp 98.4°F | Ht 64.0 in | Wt 226.6 lb

## 2021-08-31 DIAGNOSIS — E559 Vitamin D deficiency, unspecified: Secondary | ICD-10-CM | POA: Diagnosis not present

## 2021-08-31 DIAGNOSIS — Z13220 Encounter for screening for lipoid disorders: Secondary | ICD-10-CM

## 2021-08-31 DIAGNOSIS — M7989 Other specified soft tissue disorders: Secondary | ICD-10-CM

## 2021-08-31 DIAGNOSIS — R5383 Other fatigue: Secondary | ICD-10-CM

## 2021-08-31 DIAGNOSIS — E538 Deficiency of other specified B group vitamins: Secondary | ICD-10-CM | POA: Diagnosis not present

## 2021-08-31 DIAGNOSIS — Z136 Encounter for screening for cardiovascular disorders: Secondary | ICD-10-CM | POA: Diagnosis not present

## 2021-08-31 LAB — B12 AND FOLATE PANEL
Folate: >24.4 ng/mL (ref >5.9)
Vitamin B-12: 602 pg/mL (ref 211–911)

## 2021-08-31 LAB — VITAMIN D 25 HYDROXY (VIT D DEFICIENCY, FRACTURES): VITD: 39.13 ng/mL (ref 30.00–100.00)

## 2021-08-31 LAB — LIPID PANEL
Cholesterol: 207 mg/dL — ABNORMAL HIGH (ref 0–200)
HDL: 77.8 mg/dL (ref >39.00)
LDL Cholesterol: 97 mg/dL (ref 0–99)
NonHDL: 129.09
Total CHOL/HDL Ratio: 3
Triglycerides: 159 mg/dL — ABNORMAL HIGH (ref 0.0–149.0)
VLDL: 31.8 mg/dL (ref 0.0–40.0)

## 2021-08-31 LAB — IBC + FERRITIN
Ferritin: 90.9 ng/mL (ref 10.0–291.0)
Iron: 89 ug/dL (ref 42–145)
Saturation Ratios: 23 % (ref 20.0–50.0)
TIBC: 386.4 ug/dL (ref 250.0–450.0)
Transferrin: 276 mg/dL (ref 212.0–360.0)

## 2021-08-31 NOTE — Assessment & Plan Note (Signed)
Pending pernicious anemia evaluation for thoroughness.  Continue B12 IM monthly as patient has had improvement of fatigue.

## 2021-08-31 NOTE — Assessment & Plan Note (Signed)
Evaluate with [place] for lymph node versus lipoma.

## 2021-08-31 NOTE — Assessment & Plan Note (Signed)
Improved.  Again discussed likely multifactorial in regards to chronic knee pain, inability to exercise.  We discussed options as relates to pain management including Cymbalta.  We also discussed options as relates to weight loss to help her knee pain including Wellbutrin, Wegovy, metformin.  Patient may consider medication in the future.  She will let me know

## 2021-08-31 NOTE — Patient Instructions (Addendum)
For weight loss: Metformin, Wegovy ( once per week injection), and Wellbutrin.   For pain: Cymbalta  Let me know if any of these medications above are interesting to you, we can discuss them further if needed  Nice to see you!

## 2021-08-31 NOTE — Telephone Encounter (Signed)
Pt seen in office to today.

## 2021-08-31 NOTE — Progress Notes (Signed)
Subjective:    Patient ID: Krista Gentry, female    DOB: 06/22/1946, 75 y.o.   MRN: [id number]  CC: Krista Gentry is a 75 y.o. female who presents today for follow up.   HPI: Compliant with b12 IM monthly.  Fatigue has improved  Compliant with vitamin D 5000 units  She is limited in exercise to due bilateral knee pain. She is following with Dr Mack Guise for this. This affects her quality of life.  She is unable to stand for long periods of time, go up stairs easily, or use her stationary bike due pain.   She is working on weight loss with intermittent fasting.    In the past couple months she has noticed on the right upper arm a small lump.  It is nontender.   Mammogram is up-to-date HISTORY:  Past Medical History:  Diagnosis Date   Allergy    Basal cell carcinoma 10/08/2020   right lateral calf   Family history of adverse reaction to anesthesia    sister had difficult waking up from anesthesia . had pseudo cholinestrase   Past Surgical History:  Procedure Laterality Date   ABDOMINAL HYSTERECTOMY  1994   for noncancerous reason, fibroids. She has bilateral oophorectomy.   BILATERAL OOPHORECTOMY     BREAST BIOPSY     CARPAL TUNNEL RELEASE Right 10/23/2020   Procedure: RIGHT CARPAL TUNNEL RELEASE and LEFT THUMB TRIGGER INJECTION;  Surgeon: Thornton Park, MD;  Location: [facility] ORS;  Service: Orthopedics;  Laterality: Right;   CHOLECYSTECTOMY  1994   Family History  Problem Relation Age of Onset   Kidney disease Mother        nephrolithiasis   Dementia Mother    Hypertension Mother    Kidney Stones Mother    Mental retardation Sister 8       possible autism,  now with depression   Dementia Sister    Cancer Maternal Aunt        breast cancer   Dementia Maternal Aunt    Mental retardation Maternal Aunt    Breast cancer Maternal Aunt    Dementia Maternal Uncle    Cancer Maternal Grandmother        breast cancer   Breast cancer Maternal Grandmother    Heart attack  Paternal Grandmother    Kidney Stones Brother    Cancer Maternal Grandfather        Stomach cancer   Colon cancer Neg Hx     Allergies: Patient has no known allergies. Current Outpatient Medications on File Prior to Visit  Medication Sig Dispense Refill   acetaminophen (TYLENOL) 325 MG tablet Take 1-2 tablets (325-650 mg total) by mouth every 6 (six) hours as needed. 60 tablet 1   ACETYLCARNITINE PO Take 1,500 mg by mouth.     Barberry-Oreg Grape-Goldenseal (BERBERINE COMPLEX PO) Take 500 mcg by mouth daily.     cetirizine (ZYRTEC) 10 MG tablet Take 10 mg by mouth daily as needed for allergies.      Cholecalciferol (VITAMIN D) 50 MCG (2000 UT) tablet Take 6,000 Units by mouth daily.     CRANBERRY EXTRACT PO Take 4,200 mg by mouth daily.      Cyanocobalamin (B-12) 5000 MCG SUBL Place 5,000 mcg under the tongue daily.     Menaquinone-7 (VITAMIN K2 PO) Take 600 mcg by mouth daily.     Multiple Minerals-Vitamins (CITRACAL MAXIMUM PLUS) TABS Take 2 tablets by mouth daily.      Multiple Vitamins-Minerals (IMMUNE SUPPORT  PO) Take 2 tablets by mouth daily.     mupirocin ointment (BACTROBAN) 2 % Apply 1 application topically daily. 22 g 0   OVER THE COUNTER MEDICATION Take 1 tablet by mouth daily. Nutratherm otc supplement     Quercetin 250 MG TABS Take 250 mg by mouth daily.      TURMERIC-GINGER PO Take by mouth.     UNABLE TO FIND cbd 20 mg     Zinc 50 MG CAPS Take 50 mg by mouth daily.      No current facility-administered medications on file prior to visit.    Social History   Tobacco Use   Smoking status: Former    Packs/day: 1.00    Years: 5.00    Pack years: 5.00    Types: Cigarettes    Quit date: 04/14/1970    Years since quitting: 51.4   Smokeless tobacco: Never  Vaping Use   Vaping Use: Never used  Substance Use Topics   Alcohol use: Yes    Alcohol/week: 3.0 standard drinks    Types: 2 Glasses of wine, 1 Cans of beer per week    Comment: occassion   Drug use: No     Review of Systems  Constitutional:  Negative for chills, fatigue (improved) and fever.  Respiratory:  Negative for cough.   Cardiovascular:  Negative for chest pain and palpitations.  Gastrointestinal:  Negative for nausea and vomiting.  Musculoskeletal:  Positive for arthralgias.  Skin:  Negative for color change and wound.     Objective:    BP 122/72 (BP Location: Left Arm, Patient Position: Sitting, Cuff Size: Large)   Pulse 70   Temp 98.4 F (36.9 C) (Oral)   Ht '5\' 4"'$  (1.626 m)   Wt 226 lb 9.6 oz (102.8 kg)   SpO2 98%   BMI 38.90 kg/m  BP Readings from Last 3 Encounters:  08/31/21 122/72  04/17/21 124/72  10/23/20 (!) 132/56   Wt Readings from Last 3 Encounters:  08/31/21 226 lb 9.6 oz (102.8 kg)  04/17/21 234 lb (106.1 kg)  10/15/20 223 lb (101.2 kg)    Physical Exam Vitals reviewed.  Constitutional:      Appearance: She is well-developed.  Eyes:     Conjunctiva/sclera: Conjunctivae normal.  Cardiovascular:     Rate and Rhythm: Normal rate and regular rhythm.     Pulses: Normal pulses.     Heart sounds: Normal heart sounds.  Pulmonary:     Effort: Pulmonary effort is normal.     Breath sounds: Normal breath sounds. No wheezing, rhonchi or rales.  Skin:    General: Skin is warm and dry.     Comments: Poorly demarcated dense area of tissue noted approximately 1 to 2 cm in diameter.  Marked on diagram. nontender.  Nonfluctuant.  Neurological:     Mental Status: She is alert.  Psychiatric:        Speech: Speech normal.        Behavior: Behavior normal.        Thought Content: Thought content normal.       Assessment & Plan:   Problem List Items Addressed This Visit       Other   B12 deficiency - Primary    Pending pernicious anemia evaluation for thoroughness.  Continue B12 IM monthly as patient has had improvement of fatigue.      Relevant Orders   IBC + Ferritin   Fatigue    Improved.  Again discussed likely multifactorial  in regards to  chronic knee pain, inability to exercise.  We discussed options as relates to pain management including Cymbalta.  We also discussed options as relates to weight loss to help her knee pain including Wellbutrin, Wegovy, metformin.  Patient may consider medication in the future.  She will let me know       Soft tissue mass    Evaluate with [place] for lymph node versus lipoma.       Relevant Orders   US SOFT TISSUE RT UPPER EXTREMITY LTD (NON-VASCULAR)   Other Visit Diagnoses     Encounter for lipid screening for cardiovascular disease       Relevant Orders   Lipid panel   Vitamin D deficiency       Relevant Orders   VITAMIN D 25 Hydroxy (Vit-D Deficiency, Fractures)        I have discontinued Foy [place]. Zaldivar's Potassium and melatonin. I am also having her maintain her CRANBERRY EXTRACT PO, cetirizine, Zinc, Vitamin D, Menaquinone-7 (VITAMIN K2 PO), Quercetin, Citracal Maximum Plus, mupirocin ointment, Multiple Vitamins-Minerals (IMMUNE SUPPORT PO), OVER THE COUNTER MEDICATION, B-12, Barberry-Oreg Grape-Goldenseal (BERBERINE COMPLEX PO), TURMERIC-GINGER PO, ACETYLCARNITINE PO, acetaminophen, and UNABLE TO FIND.   No orders of the defined types were placed in this encounter.   Return precautions given.   Risks, benefits, and alternatives of the medications and treatment plan prescribed today were discussed, and patient expressed understanding.   Education regarding symptom management and diagnosis given to patient on AVS.  Continue to follow with Burnard Hawthorne, FNP for routine health maintenance.   Johney Frame and I agreed with plan.   Mable Paris, FNP

## 2021-09-02 DIAGNOSIS — M9905 Segmental and somatic dysfunction of pelvic region: Secondary | ICD-10-CM | POA: Diagnosis not present

## 2021-09-02 DIAGNOSIS — M9903 Segmental and somatic dysfunction of lumbar region: Secondary | ICD-10-CM | POA: Diagnosis not present

## 2021-09-02 DIAGNOSIS — M4306 Spondylolysis, lumbar region: Secondary | ICD-10-CM | POA: Diagnosis not present

## 2021-09-02 DIAGNOSIS — M5431 Sciatica, right side: Secondary | ICD-10-CM | POA: Diagnosis not present

## 2021-09-03 LAB — HEMOGLOBIN A1C
Hgb A1c MFr Bld: 5.3 % of total Hgb (ref <5.7)
Mean Plasma Glucose: 105 mg/dL
eAG (mmol/L): 5.8 mmol/L

## 2021-09-03 LAB — ANTI-PARIETAL ANTIBODY: PARIETAL CELL AB SCREEN: NEGATIVE

## 2021-09-03 LAB — INTRINSIC FACTOR ANTIBODIES: Intrinsic Factor: NEGATIVE

## 2021-09-03 LAB — TISSUE TRANSGLUTAMINASE ABS,IGG,IGA
(tTG) Ab, IgA: <1 U/mL
(tTG) Ab, IgG: <1 U/mL

## 2021-09-03 LAB — HOMOCYSTEINE: Homocysteine: 11.4 umol/L — ABNORMAL HIGH (ref <10.4)

## 2021-09-03 LAB — METHYLMALONIC ACID, SERUM: Methylmalonic Acid, Quant: 140 nmol/L (ref 87–318)

## 2021-09-09 ENCOUNTER — Ambulatory Visit
Admission: RE | Admit: 2021-09-09 | Discharge: 2021-09-09 | Disposition: A | Discharge status: Home / Self Care | Payer: Medicare Other | Source: Ambulatory Visit | Attending: Family | Admitting: Family

## 2021-09-09 ENCOUNTER — Other Ambulatory Visit: Payer: Self-pay

## 2021-09-09 DIAGNOSIS — M9903 Segmental and somatic dysfunction of lumbar region: Secondary | ICD-10-CM | POA: Diagnosis not present

## 2021-09-09 DIAGNOSIS — M7989 Other specified soft tissue disorders: Secondary | ICD-10-CM | POA: Diagnosis not present

## 2021-09-09 DIAGNOSIS — M9905 Segmental and somatic dysfunction of pelvic region: Secondary | ICD-10-CM | POA: Diagnosis not present

## 2021-09-09 DIAGNOSIS — R2231 Localized swelling, mass and lump, right upper limb: Secondary | ICD-10-CM | POA: Diagnosis not present

## 2021-09-09 DIAGNOSIS — M5431 Sciatica, right side: Secondary | ICD-10-CM | POA: Diagnosis not present

## 2021-09-09 DIAGNOSIS — M4306 Spondylolysis, lumbar region: Secondary | ICD-10-CM | POA: Diagnosis not present

## 2021-09-11 ENCOUNTER — Encounter: Payer: Self-pay | Admitting: Family

## 2021-09-14 ENCOUNTER — Encounter: Payer: Self-pay | Admitting: Family

## 2021-09-18 ENCOUNTER — Ambulatory Visit: Payer: Medicare Other

## 2021-09-21 ENCOUNTER — Encounter: Payer: Self-pay | Admitting: Family

## 2021-09-21 ENCOUNTER — Telehealth: Payer: Medicare Other | Admitting: Emergency Medicine

## 2021-09-21 DIAGNOSIS — R0981 Nasal congestion: Secondary | ICD-10-CM | POA: Diagnosis not present

## 2021-09-21 MED ORDER — AZITHROMYCIN 250 MG PO TABS: ORAL_TABLET | ORAL | 0 refills | Status: DC

## 2021-09-21 NOTE — Progress Notes (Signed)
Virtual Visit Consent   Krista Gentry, you are scheduled for a virtual visit with a [facility] provider today.     Just as with appointments in the office, your consent must be obtained to participate.  Your consent will be active for this visit and any virtual visit you may have with one of our providers in the next 365 days.     If you have a MyChart account, a copy of this consent can be sent to you electronically.  All virtual visits are billed to your insurance company just like a traditional visit in the office.    As this is a virtual visit, video technology does not allow for your provider to perform a traditional examination.  This may limit your provider's ability to fully assess your condition.  If your provider identifies any concerns that need to be evaluated in person or the need to arrange testing (such as labs, EKG, etc.), we will make arrangements to do so.     Although advances in technology are sophisticated, we cannot ensure that it will always work on either your end or our end.  If the connection with a video visit is poor, the visit may have to be switched to a telephone visit.  With either a video or telephone visit, we are not always able to ensure that we have a secure connection.     I need to obtain your verbal consent now.   Are you willing to proceed with your visit today?    Krista Gentry has provided verbal consent on 09/21/2021 for a virtual visit video.   Montine Circle, PA-C   Date: 09/21/2021 12:27 PM   Virtual Visit via Video Note   I, Montine Circle, connected with  Krista Gentry  ([id number], 75/29/1947) on 09/21/21 at 12:30 PM EDT by a video-enabled telemedicine application and verified that I am speaking with the correct person using two identifiers.  Location: Patient: Virtual Visit Location Patient: Home Provider: Virtual Visit Location Provider: Home Office   I discussed the limitations of evaluation and management by telemedicine and the  availability of in person appointments. The patient expressed understanding and agreed to proceed.    History of Present Illness: Krista Gentry is a 75 y.o. who identifies as a female who was assigned female at birth, and is being seen today for with a chief complaint of sore throat, cough, and congestion for 6 days.  States that the symptoms have been gradually worsening.  States that symptoms are worse at night.  States that this feels similar to prior sinus infection.  Had a negative home COVID test 2 days ago and had COVID back in June.  HPI: HPI  Problems:  Patient Active Problem List   Diagnosis Date Noted   Soft tissue mass 08/31/2021   B12 deficiency 08/25/2021   Fatigue 04/17/2021   Osteoarthritis of knee 12/24/2020   Trigger thumb 10/19/2020   Basal cell carcinoma 10/15/2020   Encounter for long-term (current) use of aspirin 09/08/2020   Numbness and tingling in right hand 07/25/2020   HTN (hypertension) 12/10/2019   Epistaxis 09/29/2017   Diarrhea 07/26/2017   Lipoma of torso 07/26/2017   Abnormal mammogram of right breast 10/09/2014   Osteopenia 09/18/2013   Vasovagal syncope 09/18/2013   Routine general medical examination at a health care facility 05/22/2012   Obesity, Class III, BMI 40-49.9 (morbid obesity) ([place]) 04/16/2012   Arthritis 04/14/2012   Screening for osteoporosis 04/14/2012  Allergy     Allergies: No Known Allergies Medications:  Current Outpatient Medications:    acetaminophen (TYLENOL) 325 MG tablet, Take 1-2 tablets (325-650 mg total) by mouth every 6 (six) hours as needed., Disp: 60 tablet, Rfl: 1   ACETYLCARNITINE PO, Take 1,500 mg by mouth., Disp: , Rfl:    Barberry-Oreg Grape-Goldenseal (BERBERINE COMPLEX PO), Take 500 mcg by mouth daily., Disp: , Rfl:    cetirizine (ZYRTEC) 10 MG tablet, Take 10 mg by mouth daily as needed for allergies. , Disp: , Rfl:    Cholecalciferol (VITAMIN D) 50 MCG (2000 UT) tablet, Take 6,000 Units by mouth daily.,  Disp: , Rfl:    CRANBERRY EXTRACT PO, Take 4,200 mg by mouth daily. , Disp: , Rfl:    Cyanocobalamin (B-12) 5000 MCG SUBL, Place 5,000 mcg under the tongue daily., Disp: , Rfl:    Menaquinone-7 (VITAMIN K2 PO), Take 600 mcg by mouth daily., Disp: , Rfl:    Multiple Minerals-Vitamins (CITRACAL MAXIMUM PLUS) TABS, Take 2 tablets by mouth daily. , Disp: , Rfl:    Multiple Vitamins-Minerals (IMMUNE SUPPORT PO), Take 2 tablets by mouth daily., Disp: , Rfl:    mupirocin ointment (BACTROBAN) 2 %, Apply 1 application topically daily., Disp: 22 g, Rfl: 0   OVER THE COUNTER MEDICATION, Take 1 tablet by mouth daily. Nutratherm otc supplement, Disp: , Rfl:    Quercetin 250 MG TABS, Take 250 mg by mouth daily. , Disp: , Rfl:    TURMERIC-GINGER PO, Take by mouth., Disp: , Rfl:    UNABLE TO FIND, cbd 20 mg, Disp: , Rfl:    Zinc 50 MG CAPS, Take 50 mg by mouth daily. , Disp: , Rfl:   Observations/Objective: Patient is well-developed, well-nourished in no acute distress.  Resting comfortably at home.  Head is normocephalic, atraumatic.  No labored breathing.  Speech is clear and coherent with logical content.  Patient is alert and oriented at baseline.  Sounds congested  Assessment and Plan: There are no diagnoses linked to this encounter. Azitromycin Nasal saline  Follow Up Instructions: I discussed the assessment and treatment plan with the patient. The patient was provided an opportunity to ask questions and all were answered. The patient agreed with the plan and demonstrated an understanding of the instructions.  A copy of instructions were sent to the patient via MyChart unless otherwise noted below.     The patient was advised to call back or seek an in-person evaluation if the symptoms worsen or if the condition fails to improve as anticipated.  Time:  I spent 11 minutes with the patient via telehealth technology discussing the above problems/concerns.    Montine Circle, PA-C

## 2021-09-21 NOTE — Telephone Encounter (Signed)
Called to speak with Krista Gentry. Krista Gentry states that she has been having symptoms since 09/14/21. Pt states that she is having nasal discharge, sore throat, and a cough. Pt states that it is worse in the morning and evening and has disturbed her from her sleep as she has to wake up to blow her nose or cough. Pt states that she wanted an old antibiotic. Pt was informed that she would need to be evaluated before receiving any medication. Pt was agreeable to do a mychart urgent care visit with a [place] provider. Pt verbalized understanding to the instructions to start the mychart visit and states that she will initiate the visit to be seen.

## 2021-09-24 ENCOUNTER — Other Ambulatory Visit: Payer: Self-pay

## 2021-09-24 ENCOUNTER — Ambulatory Visit (INDEPENDENT_AMBULATORY_CARE_PROVIDER_SITE_OTHER): Payer: Medicare Other

## 2021-09-24 DIAGNOSIS — E538 Deficiency of other specified B group vitamins: Secondary | ICD-10-CM | POA: Diagnosis not present

## 2021-09-24 MED ORDER — CYANOCOBALAMIN 1000 MCG/ML IJ SOLN
1000.0000 ug | Freq: Once | INTRAMUSCULAR | Status: AC
  Administered 2021-09-24: 1000 ug via INTRAMUSCULAR

## 2021-09-24 NOTE — Progress Notes (Signed)
Patient presented for B 12 injection to left deltoid, patient voiced no concerns nor showed any signs of distress during injection. 

## 2021-09-29 ENCOUNTER — Other Ambulatory Visit: Payer: Self-pay | Admitting: Family

## 2021-09-29 DIAGNOSIS — Z1231 Encounter for screening mammogram for malignant neoplasm of breast: Secondary | ICD-10-CM

## 2021-10-06 ENCOUNTER — Ambulatory Visit: Payer: Medicare Other

## 2021-10-08 ENCOUNTER — Ambulatory Visit: Payer: Medicare Other | Admitting: Dermatology

## 2021-10-08 ENCOUNTER — Other Ambulatory Visit: Payer: Self-pay

## 2021-10-08 ENCOUNTER — Encounter: Payer: Self-pay | Admitting: Dermatology

## 2021-10-08 DIAGNOSIS — Z1283 Encounter for screening for malignant neoplasm of skin: Secondary | ICD-10-CM | POA: Diagnosis not present

## 2021-10-08 DIAGNOSIS — L570 Actinic keratosis: Secondary | ICD-10-CM | POA: Diagnosis not present

## 2021-10-08 DIAGNOSIS — D485 Neoplasm of uncertain behavior of skin: Secondary | ICD-10-CM | POA: Diagnosis not present

## 2021-10-08 DIAGNOSIS — R21 Rash and other nonspecific skin eruption: Secondary | ICD-10-CM | POA: Diagnosis not present

## 2021-10-08 DIAGNOSIS — Z86018 Personal history of other benign neoplasm: Secondary | ICD-10-CM

## 2021-10-08 DIAGNOSIS — D229 Melanocytic nevi, unspecified: Secondary | ICD-10-CM

## 2021-10-08 DIAGNOSIS — Z85828 Personal history of other malignant neoplasm of skin: Secondary | ICD-10-CM

## 2021-10-08 DIAGNOSIS — L821 Other seborrheic keratosis: Secondary | ICD-10-CM

## 2021-10-08 DIAGNOSIS — L578 Other skin changes due to chronic exposure to nonionizing radiation: Secondary | ICD-10-CM

## 2021-10-08 DIAGNOSIS — D1800 Hemangioma unspecified site: Secondary | ICD-10-CM

## 2021-10-08 DIAGNOSIS — L814 Other melanin hyperpigmentation: Secondary | ICD-10-CM

## 2021-10-08 NOTE — Progress Notes (Signed)
Follow-Up Visit   Subjective  Krista Gentry is a 75 y.o. female who presents for the following: FBSE (Patient here for full body skin exam and skin cancer screening. Patient with hx of BCC. She is not aware of any new or changing spots. ).  The following portions of the chart were reviewed this encounter and updated as appropriate:   Tobacco  Allergies  Meds  Problems  Med Hx  Surg Hx  Fam Hx      Review of Systems:  No other skin or systemic complaints except as noted in HPI or Assessment and Plan.  Objective  Well appearing patient in no apparent distress; mood and affect are within normal limits.  A full examination was performed including scalp, head, eyes, ears, nose, lips, neck, chest, axillae, abdomen, back, buttocks, bilateral upper extremities, bilateral lower extremities, hands, feet, fingers, toes, fingernails, and toenails. All findings within normal limits unless otherwise noted below.  Neck - Posterior Thin erythematous plaque  right inferior buttock 0.3 cm violaceous tan thin papule within white scar. No clear pigment network.   Left helix Erythematous thin papules/macules with gritty scale.    Assessment & Plan  Rash Neck - Posterior  Favor Irritant Contact Dermatitis  Not bothersome for patient. 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  Will recheck in 3-4 months  AK (actinic keratosis) Left helix  > [facility]  Prior to procedure, discussed risks of blister formation, small wound, skin dyspigmentation, or rare scar following cryotherapy. Recommend Vaseline ointment to treated areas while healing.   Destruction of lesion - Left helix  Destruction method: cryotherapy   Informed consent: discussed and consent obtained   Lesion destroyed  using liquid nitrogen: Yes   Cryotherapy cycles:  2 Outcome: patient tolerated procedure well with no complications   Post-procedure details: wound care instructions given    Lentigines - Scattered tan macules - Due to sun exposure - Benign-appearing, observe - Recommend daily broad spectrum sunscreen SPF 30+ to sun-exposed areas, reapply every 2 hours as needed. - Call for any changes  Seborrheic Keratoses - Stuck-on, waxy, tan-brown papules and/or plaques  - Benign-appearing - Discussed benign etiology and prognosis. - Observe - Call for any changes  Melanocytic Nevi - Tan-brown and/or pink-flesh-colored symmetric macules and papules - Benign appearing on exam today - Observation - Call clinic for new or changing moles - Recommend daily use of broad spectrum spf 30+ sunscreen to sun-exposed areas.   Hemangiomas - Red papules - Discussed benign nature - Observe - Call for any changes  Actinic Damage - Chronic condition, secondary to cumulative UV/sun exposure - diffuse scaly erythematous macules with underlying dyspigmentation - Recommend daily broad spectrum sunscreen SPF 30+ to sun-exposed areas, reapply every 2 hours as needed.  - Staying in the shade or wearing long sleeves, sun glasses (UVA+UVB protection) and wide brim hats (4-inch brim around the entire circumference of the hat) are also recommended for sun protection.  - Call for new or changing lesions.  Skin cancer screening performed today.  History of Basal Cell Carcinoma of the Skin - No evidence of recurrence today at right lateral calf - Recommend regular full body skin exams - Recommend daily broad spectrum sunscreen SPF 30+ to sun-exposed areas, reapply every 2 hours as needed.  - Call if any  new or changing lesions are noted between office visits  Return in about 1 year (around 10/08/2022) for TBSE, 3-4 months to recheck nevus at right buttock.  Graciella Belton, RMA, am acting as scribe for  [name], MD .  Documentation: I have reviewed the above documentation for accuracy and completeness, and I agree with the above.  [name], MD

## 2021-10-08 NOTE — Patient Instructions (Addendum)
Melanoma ABCDEs  Melanoma is the most dangerous type of skin cancer, and is the leading cause of death from skin disease.  You are more likely to develop melanoma if you: Have light-colored skin, light-colored eyes, or red or blond hair Spend a lot of time in the sun Tan regularly, either outdoors or in a tanning bed Have had blistering sunburns, especially during childhood Have a close family member who has had a melanoma Have atypical moles or large birthmarks  Early detection of melanoma is key since treatment is typically straightforward and cure rates are extremely high if we catch it early.   The first sign of melanoma is often a change in a mole or a new dark spot.  The ABCDE system is a way of remembering the signs of melanoma.  A for asymmetry:  The two halves do not match. B for border:  The edges of the growth are irregular. C for color:  A mixture of colors are present instead of an even brown color. D for diameter:  Melanomas are usually (but not always) greater than 54mm - the size of a pencil eraser. E for evolution:  The spot keeps changing in size, shape, and color.  Please check your skin once per month between visits. You can use a small mirror in front and a large mirror behind you to keep an eye on the back side or your body.   If you see any new or changing lesions before your next follow-up, please call to schedule a visit.  Please continue daily skin protection including broad spectrum sunscreen SPF 30+ to sun-exposed areas, reapplying every 2 hours as needed when you're outdoors.    Recommend taking Heliocare sun protection supplement daily in sunny weather for additional sun protection. For maximum protection on the sunniest days, you can take up to 2 capsules of regular Heliocare OR take 1 capsule of Heliocare Ultra. For prolonged exposure (such as a full day in the sun), you can repeat your dose of the supplement 4 hours after your first dose. Heliocare can be  purchased at [facility] or at [email or web address].    If you have any questions or concerns for your doctor, please call our main line at [phone] and press option 4 to reach your doctor's medical assistant. If no one answers, please leave a voicemail as directed and we will return your call as soon as possible. Messages left after 4 pm will be answered the following business day.   You may also send [place] a message via [facility]. We typically respond to MyChart messages within 1-2 business days.  For prescription refills, please ask your pharmacy to contact our office. Our fax number is [phone].  If you have an urgent issue when the clinic is closed that cannot wait until the next business day, you can page your doctor at the number below.    Please note that while we do our best to be available for urgent issues outside of office hours, we are not available 24/7.   If you have an urgent issue and are unable to reach [place], you may choose to seek medical care at your doctor's office, retail clinic, urgent care center, or emergency room.  If you have a medical emergency, please immediately call 911 or go to the emergency department.  Pager Numbers  - Dr. Nehemiah Massed: [phone]  - Dr. Laurence Ferrari: [phone]  - Dr. Nicole Kindred: [phone]  In the event of inclement weather, please call our main line  at [phone] for an update on the status of any delays or closures.  Dermatology Medication Tips: Please keep the boxes that topical medications come in in order to help keep track of the instructions about where and how to use these. Pharmacies typically print the medication instructions only on the boxes and not directly on the medication tubes.   If your medication is too expensive, please contact our office at [phone] option 4 or send [place] a message through [place].   We are unable to tell what your co-pay for medications will be in advance as this is different depending on your  insurance coverage. However, we may be able to find a substitute medication at lower cost or fill out paperwork to get insurance to cover a needed medication.   If a prior authorization is required to get your medication covered by your insurance company, please allow [place] 1-2 business days to complete this process.  Drug prices often vary depending on where the prescription is filled and some pharmacies may offer cheaper prices.  The website www.goodrx.com contains coupons for medications through different pharmacies. The prices here do not account for what the cost may be with help from insurance (it may be cheaper with your insurance), but the website can give you the price if you did not use any insurance.  - You can print the associated coupon and take it with your prescription to the pharmacy.  - You may also stop by our office during regular business hours and pick up a GoodRx coupon card.  - If you need your prescription sent electronically to a different pharmacy, notify our office through [facility] or by phone at [phone] option 4.  Cryotherapy Aftercare  Wash gently with soap and water everyday.   Apply Vaseline and Band-Aid daily until healed.   Prior to procedure, discussed risks of blister formation, small wound, skin dyspigmentation, or rare scar following cryotherapy. Recommend Vaseline ointment to treated areas while healing.

## 2021-10-26 ENCOUNTER — Other Ambulatory Visit: Payer: Self-pay

## 2021-10-26 ENCOUNTER — Ambulatory Visit (INDEPENDENT_AMBULATORY_CARE_PROVIDER_SITE_OTHER): Payer: Medicare Other

## 2021-10-26 DIAGNOSIS — E538 Deficiency of other specified B group vitamins: Secondary | ICD-10-CM | POA: Diagnosis not present

## 2021-10-26 MED ORDER — CYANOCOBALAMIN 1000 MCG/ML IJ SOLN
1000.0000 ug | Freq: Once | INTRAMUSCULAR | Status: AC
  Administered 2021-10-26: 1000 ug via INTRAMUSCULAR

## 2021-10-26 NOTE — Progress Notes (Signed)
Patient presented for B 12 injection to left deltoid, patient voiced no concerns nor showed any signs of distress during injection. 

## 2021-11-03 ENCOUNTER — Ambulatory Visit: Payer: Medicare Other | Admitting: Family

## 2021-11-10 ENCOUNTER — Ambulatory Visit: Payer: Medicare Other | Admitting: Family

## 2021-11-10 ENCOUNTER — Other Ambulatory Visit: Payer: Self-pay

## 2021-11-10 ENCOUNTER — Encounter: Payer: Self-pay | Admitting: Family

## 2021-11-10 VITALS — BP 120/80 | HR 72 | Temp 98.4°F | Wt 227.2 lb

## 2021-11-10 DIAGNOSIS — M7989 Other specified soft tissue disorders: Secondary | ICD-10-CM | POA: Diagnosis not present

## 2021-11-10 DIAGNOSIS — E538 Deficiency of other specified B group vitamins: Secondary | ICD-10-CM

## 2021-11-10 DIAGNOSIS — J309 Allergic rhinitis, unspecified: Secondary | ICD-10-CM | POA: Diagnosis not present

## 2021-11-10 MED ORDER — MOMETASONE FUROATE 0.1 % EX CREA: 1.0000 "application " | TOPICAL_CREAM | Freq: Every day | CUTANEOUS | 1 refills | Status: AC

## 2021-11-10 NOTE — Assessment & Plan Note (Addendum)
Patient is afebrile, nontoxic in appearance.  Cough improving. Symptoms most consistent with allergic rhinitis versus viral etiology.  Itching in ears most likely atopic dermatitis and provided Elocon for this.  Patient will let me know how she is doing and certainly if symptoms were to continue, or worsen at that time would warrant antibiotic therapy.  She politely declines antibiotic therapy today.

## 2021-11-10 NOTE — Assessment & Plan Note (Signed)
Unchanged.  Area is nontender.  Patient politely declines second opinion with general surgery for MRI further information.  She will stay hypervigilant.

## 2021-11-10 NOTE — Assessment & Plan Note (Signed)
Chronic, negative pernicious anemia screen.  Fatigue improved on B12 injections.  We will continue for now.

## 2021-11-10 NOTE — Patient Instructions (Addendum)
Consider azelastine nasal spray over the counter  Please let me know if sinus congestion does not resolve.   Nice to see you as always and happy holidays!

## 2021-11-10 NOTE — Progress Notes (Signed)
Subjective:    Patient ID: AVAEH EWER, female    DOB: 15-May-1946, 75 y.o.   MRN: [id number]  CC: LAKHIA GENGLER is a 75 y.o. female who presents today for an acute visit.    HPI: Complains of clear sinus congestion x 7 days, waxes and wanes.  Coughing has improved.  She complains of bilateral ear canal itching.  This is been ongoing for some time.  No purulent discharge seen from ears.  Endorses postnasal drip. NO sob, fever, ha, vision changes  Negative covid home test.  Compliant with zyrtec. Taking tylenol prn  Using stationary bike daily 30 minutes daily. Walking and particularly stairs limited by chronic knee pain, bilaterally.     B12 deficiency-she is compliant with monthly B12 injections.  She feels that this is helpful for fatigue.  Right upper arm mass-she does not think the mass is enlarging in size.  The area is not painful.   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    HISTORY:  Past Medical History:  Diagnosis Date   Allergy    Basal cell carcinoma 10/08/2020   right lateral calf   Family history of adverse reaction to anesthesia    sister had difficult waking up from anesthesia . had pseudo cholinestrase   Past Surgical History:  Procedure Laterality Date   ABDOMINAL HYSTERECTOMY  1994   for noncancerous reason, fibroids. She has bilateral oophorectomy.   BILATERAL OOPHORECTOMY     BREAST BIOPSY     CARPAL TUNNEL RELEASE Right 10/23/2020   Procedure: RIGHT CARPAL TUNNEL RELEASE and LEFT THUMB TRIGGER INJECTION;  Surgeon: Thornton Park, MD;  Location: [facility] ORS;  Service: Orthopedics;  Laterality: Right;   CHOLECYSTECTOMY  1994   Family History  Problem Relation Age of Onset   Kidney disease Mother        nephrolithiasis   Dementia Mother    Hypertension Mother    Kidney Stones Mother    Mental retardation Sister 8       possible autism,  now with depression   Dementia Sister    Cancer Maternal Aunt        breast  cancer   Dementia Maternal Aunt    Mental retardation Maternal Aunt    Breast cancer Maternal Aunt    Dementia Maternal Uncle    Cancer Maternal Grandmother        breast cancer   Breast cancer Maternal Grandmother    Heart attack Paternal Grandmother    Kidney Stones Brother    Cancer Maternal Grandfather        Stomach cancer   Colon cancer Neg Hx     Allergies: Patient has no known allergies. Current Outpatient Medications on File Prior to Visit  Medication Sig Dispense Refill   acetaminophen (TYLENOL) 325 MG tablet Take 1-2 tablets (325-650 mg total) by mouth every 6 (six) hours as needed. 60 tablet 1   ACETYLCARNITINE PO Take 1,500 mg by mouth.     Barberry-Oreg Grape-Goldenseal (BERBERINE COMPLEX PO) Take 500 mcg by mouth daily.     cetirizine (ZYRTEC) 10 MG tablet Take 10 mg by mouth daily as needed for allergies.      Cholecalciferol (VITAMIN D) 50 MCG (2000 UT) tablet Take 6,000 Units by mouth daily.     CRANBERRY EXTRACT PO Take 4,200 mg by mouth daily.      Cyanocobalamin (B-12) 5000 MCG SUBL Place 5,000 mcg under the tongue daily.     Menaquinone-7 (VITAMIN  K2 PO) Take 600 mcg by mouth daily.     Multiple Minerals-Vitamins (CITRACAL MAXIMUM PLUS) TABS Take 2 tablets by mouth daily.      Multiple Vitamins-Minerals (IMMUNE SUPPORT PO) Take 2 tablets by mouth daily.     OVER THE COUNTER MEDICATION Take 1 tablet by mouth daily. Nutratherm otc supplement     Quercetin 250 MG TABS Take 250 mg by mouth daily.      TURMERIC-GINGER PO Take by mouth.     UNABLE TO FIND cbd 20 mg     Zinc 50 MG CAPS Take 50 mg by mouth daily.      No current facility-administered medications on file prior to visit.    Social History   Tobacco Use   Smoking status: Former    Packs/day: 1.00    Years: 5.00    Pack years: 5.00    Types: Cigarettes    Quit date: 04/14/1970    Years since quitting: 51.6   Smokeless tobacco: Never  Vaping Use   Vaping Use: Never used  Substance Use Topics    Alcohol use: Yes    Alcohol/week: 3.0 standard drinks    Types: 2 Glasses of wine, 1 Cans of beer per week    Comment: occassion   Drug use: No    Review of Systems  Constitutional:  Negative for chills and fever.  HENT:  Positive for congestion and postnasal drip. Negative for ear pain.   Respiratory:  Negative for cough.   Cardiovascular:  Negative for chest pain and palpitations.  Gastrointestinal:  Negative for nausea and vomiting.     Objective:    BP 120/80 (BP Location: Left Arm, Patient Position: Sitting, Cuff Size: Large)   Pulse 72   Temp 98.4 F (36.9 C) (Oral)   Wt 227 lb 3.2 oz (103.1 kg)   SpO2 99%   BMI 39.00 kg/m    Physical Exam Vitals reviewed.  Constitutional:      Appearance: She is well-developed.  HENT:     Head: Normocephalic and atraumatic.     Right Ear: Hearing, tympanic membrane, ear canal and external ear normal. No decreased hearing noted. No drainage, swelling or tenderness. No middle ear effusion. No foreign body. Tympanic membrane is not erythematous or bulging.     Left Ear: Hearing, tympanic membrane, ear canal and external ear normal. No decreased hearing noted. No drainage, swelling or tenderness.  No middle ear effusion. No foreign body. Tympanic membrane is not erythematous or bulging.     Ears:     Comments: Scant flaky irritation seen bilateral ear canal.  No purulent discharge    Nose: Nose normal. No rhinorrhea.     Right Sinus: No maxillary sinus tenderness or frontal sinus tenderness.     Left Sinus: No maxillary sinus tenderness or frontal sinus tenderness.     Mouth/Throat:     Pharynx: Uvula midline. No oropharyngeal exudate or posterior oropharyngeal erythema.     Tonsils: No tonsillar abscesses.  Eyes:     Conjunctiva/sclera: Conjunctivae normal.  Cardiovascular:     Rate and Rhythm: Regular rhythm.     Pulses: Normal pulses.     Heart sounds: Normal heart sounds.  Pulmonary:     Effort: Pulmonary effort is normal.      Breath sounds: Normal breath sounds. No wheezing, rhonchi or rales.  Lymphadenopathy:     Head:     Right side of head: No submental, submandibular, tonsillar, preauricular, posterior auricular or occipital adenopathy.  Left side of head: No submental, submandibular, tonsillar, preauricular, posterior auricular or occipital adenopathy.     Cervical: No cervical adenopathy.  Skin:    General: Skin is warm and dry.  Neurological:     Mental Status: She is alert.  Psychiatric:        Speech: Speech normal.        Behavior: Behavior normal.        Thought Content: Thought content normal.       Assessment & Plan:   Problem List Items Addressed This Visit       Respiratory   Allergic rhinitis - Primary    Patient is afebrile, nontoxic in appearance.  Cough improving. Symptoms most consistent with allergic rhinitis versus viral etiology.  Itching in ears most likely atopic dermatitis and provided Elocon for this.  Patient will let me know how she is doing and certainly if symptoms were to continue, or worsen at that time would warrant antibiotic therapy.  She politely declines antibiotic therapy today.      Relevant Medications   mometasone (ELOCON) 0.1 % cream     Other   B12 deficiency    Chronic, negative pernicious anemia screen.  Fatigue improved on B12 injections.  We will continue for now.      Soft tissue mass    Unchanged.  Area is nontender.  Patient politely declines second opinion with general surgery for MRI further information.  She will stay hypervigilant.         I have discontinued Foy [place]. Clippard's azithromycin. I am also having her start on mometasone. Additionally, I am having her maintain her CRANBERRY EXTRACT PO, cetirizine, Zinc, Vitamin D, Menaquinone-7 (VITAMIN K2 PO), Quercetin, Citracal Maximum Plus, Multiple Vitamins-Minerals (IMMUNE SUPPORT PO), OVER THE COUNTER MEDICATION, B-12, Barberry-Oreg Grape-Goldenseal (BERBERINE COMPLEX PO),  TURMERIC-GINGER PO, ACETYLCARNITINE PO, acetaminophen, and UNABLE TO FIND.   Meds ordered this encounter  Medications   mometasone (ELOCON) 0.1 % cream    Sig: Apply 1 application topically daily. Appear pea sized ( or less) amount to external ear, and slightly in canal.    Dispense:  15 g    Refill:  1    Order Specific Question:   Supervising Provider    Answer:   Crecencio Mc [[id number]]    Return precautions given.   Risks, benefits, and alternatives of the medications and treatment plan prescribed today were discussed, and patient expressed understanding.   Education regarding symptom management and diagnosis given to patient on AVS.  Continue to follow with Burnard Hawthorne, FNP for routine health maintenance.   Johney Frame and I agreed with plan.   Mable Paris, FNP

## 2021-11-25 ENCOUNTER — Other Ambulatory Visit: Payer: Self-pay

## 2021-11-25 ENCOUNTER — Ambulatory Visit
Admission: RE | Admit: 2021-11-25 | Discharge: 2021-11-25 | Disposition: A | Discharge status: Home / Self Care | Payer: Medicare Other | Source: Ambulatory Visit | Attending: Family | Admitting: Family

## 2021-11-25 DIAGNOSIS — Z1231 Encounter for screening mammogram for malignant neoplasm of breast: Secondary | ICD-10-CM | POA: Insufficient documentation

## 2021-11-26 ENCOUNTER — Other Ambulatory Visit: Payer: Self-pay

## 2021-11-26 ENCOUNTER — Ambulatory Visit (INDEPENDENT_AMBULATORY_CARE_PROVIDER_SITE_OTHER): Payer: Medicare Other

## 2021-11-26 DIAGNOSIS — E538 Deficiency of other specified B group vitamins: Secondary | ICD-10-CM | POA: Diagnosis not present

## 2021-11-26 MED ORDER — CYANOCOBALAMIN 1000 MCG/ML IJ SOLN
1000.0000 ug | Freq: Once | INTRAMUSCULAR | Status: AC
  Administered 2021-11-26: 1000 ug via INTRAMUSCULAR

## 2021-11-26 NOTE — Progress Notes (Signed)
Patient presented for B 12 injection to left deltoid, patient voiced no concerns nor showed any signs of distress during injection. 

## 2021-12-02 ENCOUNTER — Ambulatory Visit: Payer: Medicare Other

## 2021-12-29 ENCOUNTER — Ambulatory Visit: Payer: Medicare Other

## 2021-12-31 ENCOUNTER — Ambulatory Visit: Payer: Medicare Other

## 2022-01-04 ENCOUNTER — Other Ambulatory Visit: Payer: Self-pay

## 2022-01-04 ENCOUNTER — Ambulatory Visit (INDEPENDENT_AMBULATORY_CARE_PROVIDER_SITE_OTHER): Payer: Medicare Other

## 2022-01-04 DIAGNOSIS — E538 Deficiency of other specified B group vitamins: Secondary | ICD-10-CM

## 2022-01-04 MED ORDER — CYANOCOBALAMIN 1000 MCG/ML IJ SOLN
1000.0000 ug | Freq: Once | INTRAMUSCULAR | Status: AC
  Administered 2022-01-04: 1000 ug via INTRAMUSCULAR

## 2022-01-04 NOTE — Progress Notes (Addendum)
Krista Gentry presents today for injection per MD orders. B12 injection administered IM in left Upper Arm. Administration without incident. Patient tolerated well.  [name],cma

## 2022-01-05 ENCOUNTER — Encounter: Payer: Self-pay | Admitting: Family

## 2022-01-05 ENCOUNTER — Telehealth: Payer: Medicare Other | Admitting: Physician Assistant

## 2022-01-05 ENCOUNTER — Other Ambulatory Visit: Payer: Self-pay

## 2022-01-05 DIAGNOSIS — J019 Acute sinusitis, unspecified: Secondary | ICD-10-CM

## 2022-01-05 DIAGNOSIS — B9689 Other specified bacterial agents as the cause of diseases classified elsewhere: Secondary | ICD-10-CM

## 2022-01-05 MED ORDER — DOXYCYCLINE HYCLATE 100 MG PO TABS
100.0000 mg | ORAL_TABLET | Freq: Two times a day (BID) | ORAL | 0 refills | Status: DC
Start: 2022-01-05 — End: 2022-04-22

## 2022-01-05 NOTE — Progress Notes (Signed)
E-Visit for Sinus Problems  We are sorry that you are not feeling well.  Here is how we plan to help!  Based on what you have shared with me it looks like you have sinusitis.  Sinusitis is inflammation and infection in the sinus cavities of the head.  Based on your presentation I believe you most likely have Acute Bacterial Sinusitis.  This is an infection caused by bacteria and is treated with antibiotics. I have prescribed Doxycycline 100mg  by mouth twice a day for 10 days. This is a better first-line treatment for sinusitis than a plain Z-pack as it has broader sinus coverage. You may use an oral decongestant such as Mucinex D or if you have glaucoma or high blood pressure use plain Mucinex. Saline nasal spray help and can safely be used as often as needed for congestion.  If you develop worsening sinus pain, fever or notice severe headache and vision changes, or if symptoms are not better after completion of antibiotic, please schedule an appointment with a health care provider.    Sinus infections are not as easily transmitted as other respiratory infection, however we still recommend that you avoid close contact with loved ones, especially the very young and elderly.  Remember to wash your hands thoroughly throughout the day as this is the number one way to prevent the spread of infection!  Home Care: Only take medications as instructed by your medical team. Complete the entire course of an antibiotic. Do not take these medications with alcohol. A steam or ultrasonic humidifier can help congestion.  You can place a towel over your head and breathe in the steam from hot water coming from a faucet. Avoid close contacts especially the very young and the elderly. Cover your mouth when you cough or sneeze. Always remember to wash your hands.  Get Help Right Away If: You develop worsening fever or sinus pain. You develop a severe head ache or visual changes. Your symptoms persist after you have  completed your treatment plan.  Make sure you Understand these instructions. Will watch your condition. Will get help right away if you are not doing well or get worse.  Thank you for choosing an e-visit.  Your e-visit answers were reviewed by a board certified advanced clinical practitioner to complete your personal care plan. Depending upon the condition, your plan could have included both over the counter or prescription medications.  Please review your pharmacy choice. Make sure the pharmacy is open so you can pick up prescription now. If there is a problem, you may contact your provider through [employer] and have the prescription routed to another pharmacy.  Your safety is important to [place]. If you have drug allergies check your prescription carefully.   For the next 24 hours you can use MyChart to ask questions about today's visit, request a non-urgent call back, or ask for a work or school excuse. You will get an email in the next two days asking about your experience. I hope that your e-visit has been valuable and will speed your recovery.

## 2022-01-05 NOTE — Telephone Encounter (Signed)
I called and spoke with patient to see if she wanted to have a VV with a [place] provider & what symptoms were. Pt stated that she had this before & had been on-going post-nasal drip with congestion mainly in the morning. She said that the phlegm is slightly discolored. Pt stated that a z-pack normally knocks this out for her. I explained margaret was out of office & visit was required. I did tell patient about mychart evisit & mychart virtual urgent are options. Pt stated that she would like to try that first before scheduling an appointment here or elsewhere in [place].

## 2022-01-05 NOTE — Progress Notes (Signed)
I have spent 5 minutes in review of e-visit questionnaire, review and updating patient chart, medical decision making and response to patient.   Tyray Proch Cody Trella Thurmond, PA-C    

## 2022-02-05 ENCOUNTER — Ambulatory Visit: Payer: Medicare Other

## 2022-02-08 ENCOUNTER — Ambulatory Visit (INDEPENDENT_AMBULATORY_CARE_PROVIDER_SITE_OTHER): Payer: Medicare Other | Admitting: *Deleted

## 2022-02-08 ENCOUNTER — Other Ambulatory Visit: Payer: Self-pay

## 2022-02-08 DIAGNOSIS — E538 Deficiency of other specified B group vitamins: Secondary | ICD-10-CM | POA: Diagnosis not present

## 2022-02-08 MED ORDER — CYANOCOBALAMIN 1000 MCG/ML IJ SOLN
1000.0000 ug | Freq: Once | INTRAMUSCULAR | Status: AC
  Administered 2022-02-08: 1000 ug via INTRAMUSCULAR

## 2022-02-08 NOTE — Progress Notes (Addendum)
Pt arrived for B12 injection, given in R deltoid. Pt tolerated injection well, showed no signs of distress nor voiced any concerns.  

## 2022-03-09 ENCOUNTER — Ambulatory Visit (INDEPENDENT_AMBULATORY_CARE_PROVIDER_SITE_OTHER): Payer: Medicare Other | Admitting: *Deleted

## 2022-03-09 ENCOUNTER — Other Ambulatory Visit: Payer: Self-pay

## 2022-03-09 DIAGNOSIS — E538 Deficiency of other specified B group vitamins: Secondary | ICD-10-CM

## 2022-03-09 MED ORDER — CYANOCOBALAMIN 1000 MCG/ML IJ SOLN
1000.0000 ug | Freq: Once | INTRAMUSCULAR | Status: AC
  Administered 2022-03-09: 1000 ug via INTRAMUSCULAR

## 2022-03-09 NOTE — Progress Notes (Signed)
Patient presented for B 12 injection to left deltoid, patient voiced no concerns nor showed any signs of distress during injection. 

## 2022-04-12 ENCOUNTER — Ambulatory Visit (INDEPENDENT_AMBULATORY_CARE_PROVIDER_SITE_OTHER): Payer: Medicare Other | Admitting: *Deleted

## 2022-04-12 DIAGNOSIS — E538 Deficiency of other specified B group vitamins: Secondary | ICD-10-CM

## 2022-04-12 MED ORDER — CYANOCOBALAMIN 1000 MCG/ML IJ SOLN
1000.0000 ug | Freq: Once | INTRAMUSCULAR | Status: AC
  Administered 2022-04-12: 1000 ug via INTRAMUSCULAR

## 2022-04-12 NOTE — Progress Notes (Signed)
Patient presented for B 12 injection to left deltoid, patient voiced no concerns nor showed any signs of distress during injection. 

## 2022-04-22 ENCOUNTER — Ambulatory Visit (INDEPENDENT_AMBULATORY_CARE_PROVIDER_SITE_OTHER): Payer: Medicare Other

## 2022-04-22 VITALS — Ht 64.0 in | Wt 227.0 lb

## 2022-04-22 DIAGNOSIS — Z0000 Encounter for general adult medical examination without abnormal findings: Secondary | ICD-10-CM | POA: Diagnosis not present

## 2022-04-22 NOTE — Progress Notes (Signed)
Subjective:   Krista Gentry is a 76 y.o. female who presents for Medicare Annual (Subsequent) preventive examination.  Review of Systems    No ROS.  Medicare Wellness Virtual Visit.  Visual/audio telehealth visit, UTA vital signs.   See social history for additional risk factors.   Cardiac Risk Factors include: advanced age (>58men, >16 women);hypertension     Objective:    Today's Vitals   04/22/22 0902  Weight: 227 lb (103 kg)  Height: 5\' 4"  (1.626 m)   Body mass index is 38.96 kg/m.     04/22/2022    9:09 AM 10/23/2020    8:13 AM 10/15/2020    9:34 AM 10/03/2020    8:41 AM 10/03/2019    8:46 AM 09/27/2018    4:41 PM  Advanced Directives  Does Patient Have a Medical Advance Directive? No Yes Yes Yes Yes Yes  Type of Advance Directive  Living will Living will Healthcare Power of [place];Living will Healthcare Power of [place];Living will Healthcare Power of Attorney  Does patient want to make changes to medical advance directive?  No - Patient declined  No - Patient declined No - Patient declined No - Patient declined  Copy of Healthcare Power of Attorney in Chart?  No - copy requested  No - copy requested No - copy requested No - copy requested  Would patient like information on creating a medical advance directive? No - Patient declined         Current Medications (verified) Outpatient Encounter Medications as of 04/22/2022  Medication Sig   acetaminophen (TYLENOL) 325 MG tablet Take 1-2 tablets (325-650 mg total) by mouth every 6 (six) hours as needed.   ACETYLCARNITINE PO Take 1,500 mg by mouth.   Barberry-Oreg Grape-Goldenseal (BERBERINE COMPLEX PO) Take 500 mcg by mouth daily.   cetirizine (ZYRTEC) 10 MG tablet Take 10 mg by mouth daily as needed for allergies.    Cholecalciferol (VITAMIN D) 50 MCG (2000 UT) tablet Take 6,000 Units by mouth daily.   CRANBERRY EXTRACT PO Take 4,200 mg by mouth daily.    Menaquinone-7 (VITAMIN K2 PO) Take 600 mcg by mouth daily.    mometasone (ELOCON) 0.1 % cream Apply 1 application topically daily. Appear pea sized ( or less) amount to external ear, and slightly in canal.   Multiple Minerals-Vitamins (CITRACAL MAXIMUM PLUS) TABS Take 2 tablets by mouth daily.    Multiple Vitamins-Minerals (IMMUNE SUPPORT PO) Take 2 tablets by mouth daily.   OVER THE COUNTER MEDICATION Take 1 tablet by mouth daily. Nutratherm otc supplement   Quercetin 250 MG TABS Take 250 mg by mouth daily.    TURMERIC-GINGER PO Take by mouth.   UNABLE TO FIND cbd 20 mg   Zinc 50 MG CAPS Take 50 mg by mouth daily.    [DISCONTINUED] Cyanocobalamin (B-12) 5000 MCG SUBL Place 5,000 mcg under the tongue daily.   [DISCONTINUED] doxycycline (VIBRA-TABS) 100 MG tablet Take 1 tablet (100 mg total) by mouth 2 (two) times daily.   No facility-administered encounter medications on file as of 04/22/2022.    Allergies (verified) Patient has no known allergies.   History: Past Medical History:  Diagnosis Date   Allergy    Basal cell carcinoma 10/08/2020   right lateral calf   Family history of adverse reaction to anesthesia    sister had difficult waking up from anesthesia . had pseudo cholinestrase   Past Surgical History:  Procedure Laterality Date   ABDOMINAL HYSTERECTOMY  1994   for  noncancerous reason, fibroids. She has bilateral oophorectomy.   BILATERAL OOPHORECTOMY     BREAST BIOPSY     CARPAL TUNNEL RELEASE Right 10/23/2020   Procedure: RIGHT CARPAL TUNNEL RELEASE and LEFT THUMB TRIGGER INJECTION;  Surgeon: Juanell Fairly, MD;  Location: [facility] ORS;  Service: Orthopedics;  Laterality: Right;   CHOLECYSTECTOMY  1994   Family History  Problem Relation Age of Onset   Kidney disease Mother        nephrolithiasis   Dementia Mother    Hypertension Mother    Kidney Stones Mother    Mental retardation Sister 8       possible autism,  now with depression   Dementia Sister    Cancer Maternal Aunt        breast cancer   Dementia Maternal Aunt     Mental retardation Maternal Aunt    Breast cancer Maternal Aunt    Dementia Maternal Uncle    Cancer Maternal Grandmother        breast cancer   Breast cancer Maternal Grandmother    Heart attack Paternal Grandmother    Kidney Stones Brother    Cancer Maternal Grandfather        Stomach cancer   Colon cancer Neg Hx    Social History   Socioeconomic History   Marital status: Married    Spouse name: Not on file   Number of children: Not on file   Years of education: Not on file   Highest education level: Not on file  Occupational History   Not on file  Tobacco Use   Smoking status: Former    Packs/day: 1.00    Years: 5.00    Pack years: 5.00    Types: Cigarettes    Quit date: 04/14/1970    Years since quitting: 52.0   Smokeless tobacco: Never  Vaping Use   Vaping Use: Never used  Substance and Sexual Activity   Alcohol use: Yes    Alcohol/week: 3.0 standard drinks    Types: 2 Glasses of wine, 1 Cans of beer per week    Comment: occassion   Drug use: No   Sexual activity: Not on file  Other Topics Concern   Not on file  Social History Narrative   Not on file   Social Determinants of Health   Financial Resource Strain: Low Risk    Difficulty of Paying Living Expenses: Not hard at all  Food Insecurity: No Food Insecurity   Worried About Programme researcher, broadcasting/film/video in the Last Year: Never true   Ran Out of Food in the Last Year: Never true  Transportation Needs: No Transportation Needs   Lack of Transportation (Medical): No   Lack of Transportation (Non-Medical): No  Physical Activity: Sufficiently Active   Days of Exercise per Week: 7 days   Minutes of Exercise per Session: 30 min  Stress: No Stress Concern Present   Feeling of Stress : Not at all  Social Connections: Unknown   Frequency of Communication with Friends and Family: More than three times a week   Frequency of Social Gatherings with Friends and Family: More than three times a week   Attends Religious  Services: Not on Scientist, clinical (histocompatibility and immunogenetics) or Organizations: Not on file   Attends Banker Meetings: Not on file   Marital Status: Married    Tobacco Counseling Counseling given: Not Answered   Clinical Intake:  Pre-visit preparation completed: Yes  Diabetes: No  How often do you need to have someone help you when you read instructions, pamphlets, or other written materials from your doctor or pharmacy?: 1 - Never   Interpreter Needed?: No      Activities of Daily Living    04/22/2022    9:05 AM  In your present state of health, do you have any difficulty performing the following activities:  Hearing? 0  Vision? 0  Difficulty concentrating or making decisions? 0  Walking or climbing stairs? 0  Comment Paces self.  Dressing or bathing? 0  Doing errands, shopping? 0  Preparing Food and eating ? N  Using the Toilet? N  In the past six months, have you accidently leaked urine? Y  Comment Managed with daily pad  Do you have problems with loss of bowel control? N  Managing your Medications? N  Managing your Finances? N  Housekeeping or managing your Housekeeping? N    Patient Care Team: Allegra Grana, FNP as PCP - General (Family Medicine)  Indicate any recent Medical Services you may have received from other than [facility] providers in the past year (date may be approximate).     Assessment:   This is a routine wellness examination for [place].  Virtual Visit via Telephone Note  I connected with  Krista Gentry on 04/22/22 at  9:00 AM EDT by telephone and verified that I am speaking with the correct person using two identifiers.  Persons participating in the virtual visit: patient/Nurse Health Advisor   I discussed the limitations of performing an evaluation and management service by telehealth. The patient expressed understanding and agreed to proceed. We continued and completed visit with audio only. Some vital signs may be absent or  patient reported.   Hearing/Vision screen Hearing Screening - Comments:: Patient is able to hear conversational tones without difficulty.  No issues reported. Vision Screening - Comments:: Followed by [facility] They have regular follow up with the ophthalmologist  Dietary issues and exercise activities discussed: Current Exercise Habits: Home exercise routine, Type of exercise: strength training/weights, Time (Minutes): 30, Frequency (Times/Week): 3, Weekly Exercise (Minutes/Week): 90, Intensity: Mild Healthy diet; 16 hr fast Fair water intake   Goals Addressed               This Visit's Progress     Patient Stated     COMPLETED: DIET - INCREASE WATER INTAKE (pt-stated)        Maintain Healthy Lifestyle (pt-stated)        Stay active Healthy diet Stay hydrated         Depression Screen    04/22/2022    9:08 AM 11/10/2021   10:00 AM 10/03/2020    8:39 AM 10/03/2019    8:46 AM 09/27/2018    8:50 AM 09/29/2017    9:49 AM 07/26/2017   11:09 AM  PHQ 2/9 Scores  PHQ - 2 Score 0 0 0 0 0 0 0    Fall Risk    04/22/2022    9:08 AM 11/10/2021   10:00 AM 10/03/2020    8:42 AM 01/23/2020   11:41 AM 12/10/2019    8:11 AM  Fall Risk   Falls in the past year? 0 0 0 0 0  Number falls in past yr: 0 0 0    Injury with Fall?  0     Follow up Falls evaluation completed  Falls evaluation completed Falls evaluation completed Falls evaluation completed    FALL RISK  PREVENTION PERTAINING TO THE HOME: Home free of loose throw rugs in walkways, pet beds, electrical cords, etc? Yes  Adequate lighting in your home to reduce risk of falls? Yes   ASSISTIVE DEVICES UTILIZED TO PREVENT FALLS: Phone life alert? Yes  Use of a cane, walker or w/c? No   TIMED UP AND GO: Was the test performed? No .   Cognitive Function: Patient is alert and oriented x3.      09/27/2018    8:53 AM  MMSE - Mini Mental State Exam  Orientation to time 5  Orientation to Place 5  Registration 3   Attention/ Calculation 5  Recall 3  Language- name 2 objects 2  Language- repeat 1  Language- follow 3 step command 3  Language- read & follow direction 1  Write a sentence 1  Copy design 1  Total score 30        10/03/2019    8:54 AM  6CIT Screen  What Year? 0 points  What month? 0 points  What time? 0 points  Count back from 20 0 points  Months in reverse 0 points  Repeat phrase 0 points  Total Score 0 points    Immunizations Immunization History  Administered Date(s) Administered   Influenza Split 10/05/2014   Influenza, High Dose Seasonal PF 10/23/2018   Influenza-Unspecified 09/06/2013   PPD Test 08/15/2013   Pneumococcal Conjugate-13 08/03/2017   Pneumococcal Polysaccharide-23 08/15/2013   Tdap 09/06/2013   Shingrix Completed?: No.    Education has been provided regarding the importance of this vaccine. Patient has been advised to call insurance company to determine out of pocket expense if they have not yet received this vaccine. Advised may also receive vaccine at local pharmacy or Health Dept. Verbalized acceptance and understanding.  Screening Tests Health Maintenance  Topic Date Due   Zoster Vaccines- Shingrix (1 of 2) 07/23/2022 (Originally 11/11/1965)   INFLUENZA VACCINE  07/20/2022   TETANUS/TDAP  09/07/2023   Fecal DNA (Cologuard)  10/22/2023   Pneumonia Vaccine 73+ Years old  Completed   DEXA SCAN  Completed   Hepatitis C Screening  Completed   HPV VACCINES  Aged Out   COVID-19 Vaccine  Discontinued   Health Maintenance There are no preventive care reminders to display for this patient.  Mammogram- completed 11/2021.   Lung Cancer Screening: (Low Dose CT Chest recommended if Age 31-80 years, 30 pack-year currently smoking OR have quit w/in 15years.) does not qualify.   Vision Screening: Recommended annual ophthalmology exams for early detection of glaucoma and other disorders of the eye.  Dental Screening: Recommended annual dental exams  for proper oral hygiene  Community Resource Referral / Chronic Care Management: CRR required this visit?  No   CCM required this visit?  No      Plan:   Keep all routine maintenance appointments.   I have personally reviewed and noted the following in the patient's chart:   Medical and social history Use of alcohol, tobacco or illicit drugs  Current medications and supplements including opioid prescriptions.  Functional ability and status Nutritional status Physical activity Advanced directives List of other physicians Hospitalizations, surgeries, and ER visits in previous 12 months Vitals Screenings to include cognitive, depression, and falls Referrals and appointments  In addition, I have reviewed and discussed with patient certain preventive protocols, quality metrics, and best practice recommendations. A written personalized care plan for preventive services as well as general preventive health recommendations were provided to patient.  Ashok Pall, LPN   12/25/1094

## 2022-04-22 NOTE — Patient Instructions (Addendum)
?  Ms. Chiquito , ?Thank you for taking time to come for your Medicare Wellness Visit. I appreciate your ongoing commitment to your health goals. Please review the following plan we discussed and let me know if I can assist you in the future.  ? ?These are the goals we discussed: ? Goals   ? ?  ? Patient Stated  ?   Maintain Healthy Lifestyle (pt-stated)   ?   Stay active ?Healthy diet ?Stay hydrated ? ? ?  ? ?  ?  ?This is a list of the screening recommended for you and due dates:  ?Health Maintenance  ?Topic Date Due  ? Zoster (Shingles) Vaccine (1 of 2) 07/23/2022*  ? Flu Shot  07/20/2022  ? Tetanus Vaccine  09/07/2023  ? Cologuard (Stool DNA test)  10/22/2023  ? Pneumonia Vaccine  Completed  ? DEXA scan (bone density measurement)  Completed  ? Hepatitis C Screening: USPSTF Recommendation to screen - Ages 60-79 yo.  Completed  ? HPV Vaccine  Aged Out  ? COVID-19 Vaccine  Discontinued  ?*Topic was postponed. The date shown is not the original due date.  ?  ?

## 2022-05-04 DIAGNOSIS — H259 Unspecified age-related cataract: Secondary | ICD-10-CM | POA: Diagnosis not present

## 2022-05-11 ENCOUNTER — Encounter: Payer: Self-pay | Admitting: Family

## 2022-05-11 ENCOUNTER — Ambulatory Visit (INDEPENDENT_AMBULATORY_CARE_PROVIDER_SITE_OTHER): Payer: Medicare Other | Admitting: Family

## 2022-05-11 ENCOUNTER — Telehealth: Payer: Self-pay | Admitting: Family

## 2022-05-11 VITALS — BP 128/78 | HR 68 | Temp 97.7°F | Ht 64.0 in | Wt 224.6 lb

## 2022-05-11 DIAGNOSIS — Z0000 Encounter for general adult medical examination without abnormal findings: Secondary | ICD-10-CM

## 2022-05-11 DIAGNOSIS — I10 Essential (primary) hypertension: Secondary | ICD-10-CM

## 2022-05-11 DIAGNOSIS — E559 Vitamin D deficiency, unspecified: Secondary | ICD-10-CM | POA: Diagnosis not present

## 2022-05-11 DIAGNOSIS — Z87891 Personal history of nicotine dependence: Secondary | ICD-10-CM

## 2022-05-11 LAB — COMPREHENSIVE METABOLIC PANEL
ALT: 12 U/L (ref 0–35)
AST: 15 U/L (ref 0–37)
Albumin: 4.3 g/dL (ref 3.5–5.2)
Alkaline Phosphatase: 59 U/L (ref 39–117)
BUN: 13 mg/dL (ref 6–23)
CO2: 30 mEq/L (ref 19–32)
Calcium: 9.7 mg/dL (ref 8.4–10.5)
Chloride: 101 mEq/L (ref 96–112)
Creatinine, Ser: 0.96 mg/dL (ref 0.40–1.20)
GFR: 57.88 mL/min — ABNORMAL LOW (ref >60.00)
Glucose, Bld: 92 mg/dL (ref 70–99)
Potassium: 4.3 mEq/L (ref 3.5–5.1)
Sodium: 139 mEq/L (ref 135–145)
Total Bilirubin: 0.6 mg/dL (ref 0.2–1.2)
Total Protein: 6.9 g/dL (ref 6.0–8.3)

## 2022-05-11 LAB — VITAMIN D 25 HYDROXY (VIT D DEFICIENCY, FRACTURES): VITD: 30.71 ng/mL (ref 30.00–100.00)

## 2022-05-11 LAB — CBC WITH DIFFERENTIAL/PLATELET
Basophils Absolute: 0.1 10*3/uL (ref 0.0–0.1)
Basophils Relative: 1.1 % (ref 0.0–3.0)
Eosinophils Absolute: 0.1 10*3/uL (ref 0.0–0.7)
Eosinophils Relative: 2.6 % (ref 0.0–5.0)
HCT: 41.3 % (ref 36.0–46.0)
Hemoglobin: 13.4 g/dL (ref 12.0–15.0)
Lymphocytes Relative: 29 % (ref 12.0–46.0)
Lymphs Abs: 1.5 10*3/uL (ref 0.7–4.0)
MCHC: 32.4 g/dL (ref 30.0–36.0)
MCV: 86.2 fl (ref 78.0–100.0)
Monocytes Absolute: 0.4 10*3/uL (ref 0.1–1.0)
Monocytes Relative: 7.5 % (ref 3.0–12.0)
Neutro Abs: 3 10*3/uL (ref 1.4–7.7)
Neutrophils Relative %: 59.8 % (ref 43.0–77.0)
Platelets: 211 10*3/uL (ref 150.0–400.0)
RBC: 4.79 Mil/uL (ref 3.87–5.11)
RDW: 15.1 % (ref 11.5–15.5)
WBC: 5 10*3/uL (ref 4.0–10.5)

## 2022-05-11 LAB — TSH: TSH: 2.98 u[IU]/mL (ref 0.35–5.50)

## 2022-05-11 LAB — LIPID PANEL
Cholesterol: 225 mg/dL — ABNORMAL HIGH (ref 0–200)
HDL: 78.1 mg/dL (ref >39.00)
LDL Cholesterol: 118 mg/dL — ABNORMAL HIGH (ref 0–99)
NonHDL: 146.67
Total CHOL/HDL Ratio: 3
Triglycerides: 145 mg/dL (ref 0.0–149.0)
VLDL: 29 mg/dL (ref 0.0–40.0)

## 2022-05-11 LAB — HEMOGLOBIN A1C: Hgb A1c MFr Bld: 5.5 % (ref 4.6–6.5)

## 2022-05-11 NOTE — Assessment & Plan Note (Signed)
Mammogram and bone density will be due later this year; patient will call to schedule these in the Fall.  Encourage continued exercise.  Unable to see official reportof  Cologuard and have asked CMA to print from [employer] for my review. AAA screen ordered due to h/o smoking.

## 2022-05-11 NOTE — Telephone Encounter (Signed)
Can you log on to cologuard and down load copy of cologuard result from 2021 It is abstracted in chart but I cannot find official report  You may print and I can sign so we can add to chart

## 2022-05-11 NOTE — Patient Instructions (Addendum)
Call me in the Fall so I can order mammogram AND bone density.  I have ordered ultrasound of your abdomen to evaluate for abdominal aneurysm due to your history of smoking.  Please call your insurance prior to scheduling this test to be sure that it is covered ; it should be a preventative screening  Health Maintenance for Postmenopausal Women Menopause is a normal process in which your ability to get pregnant comes to an end. This process happens slowly over many months or years, usually between the ages of 11 and 61. Menopause is complete when you have missed your menstrual period for 12 months. It is important to talk with your health care provider about some of the most common conditions that affect women after menopause (postmenopausal women). These include heart disease, cancer, and bone loss (osteoporosis). Adopting a healthy lifestyle and getting preventive care can help to promote your health and wellness. The actions you take can also lower your chances of developing some of these common conditions. What are the signs and symptoms of menopause? During menopause, you may have the following symptoms: Hot flashes. These can be moderate or severe. Night sweats. Decrease in sex drive. Mood swings. Headaches. Tiredness (fatigue). Irritability. Memory problems. Problems falling asleep or staying asleep. Talk with your health care provider about treatment options for your symptoms. Do I need hormone replacement therapy? Hormone replacement therapy is effective in treating symptoms that are caused by menopause, such as hot flashes and night sweats. Hormone replacement carries certain risks, especially as you become older. If you are thinking about using estrogen or estrogen with progestin, discuss the benefits and risks with your health care provider. How can I reduce my risk for heart disease and stroke? The risk of heart disease, heart attack, and stroke increases as you age. One of the  causes may be a change in the body's hormones during menopause. This can affect how your body uses dietary fats, triglycerides, and cholesterol. Heart attack and stroke are medical emergencies. There are many things that you can do to help prevent heart disease and stroke. Watch your blood pressure High blood pressure causes heart disease and increases the risk of stroke. This is more likely to develop in people who have high blood pressure readings or are overweight. Have your blood pressure checked: Every 3-5 years if you are 71-61 years of age. Every year if you are 29 years old or older. Eat a healthy diet  Eat a diet that includes plenty of vegetables, fruits, low-fat dairy products, and lean protein. Do not eat a lot of foods that are high in solid fats, added sugars, or sodium. Get regular exercise Get regular exercise. This is one of the most important things you can do for your health. Most adults should: Try to exercise for at least 150 minutes each week. The exercise should increase your heart rate and make you sweat (moderate-intensity exercise). Try to do strengthening exercises at least twice each week. Do these in addition to the moderate-intensity exercise. Spend less time sitting. Even light physical activity can be beneficial. Other tips Work with your health care provider to achieve or maintain a healthy weight. Do not use any products that contain nicotine or tobacco. These products include cigarettes, chewing tobacco, and vaping devices, such as e-cigarettes. If you need help quitting, ask your health care provider. Know your numbers. Ask your health care provider to check your cholesterol and your blood sugar (glucose). Continue to have your blood tested as  directed by your health care provider. Do I need screening for cancer? Depending on your health history and family history, you may need to have cancer screenings at different stages of your life. This may include  screening for: Breast cancer. Cervical cancer. Lung cancer. Colorectal cancer. What is my risk for osteoporosis? After menopause, you may be at increased risk for osteoporosis. Osteoporosis is a condition in which bone destruction happens more quickly than new bone creation. To help prevent osteoporosis or the bone fractures that can happen because of osteoporosis, you may take the following actions: If you are 1-57 years old, get at least 1,000 mg of calcium and at least 600 international units (IU) of vitamin D per day. If you are older than age 83 but younger than age 81, get at least 1,200 mg of calcium and at least 600 international units (IU) of vitamin D per day. If you are older than age 42, get at least 1,200 mg of calcium and at least 800 international units (IU) of vitamin D per day. Smoking and drinking excessive alcohol increase the risk of osteoporosis. Eat foods that are rich in calcium and vitamin D, and do weight-bearing exercises several times each week as directed by your health care provider. How does menopause affect my mental health? Depression may occur at any age, but it is more common as you become older. Common symptoms of depression include: Feeling depressed. Changes in sleep patterns. Changes in appetite or eating patterns. Feeling an overall lack of motivation or enjoyment of activities that you previously enjoyed. Frequent crying spells. Talk with your health care provider if you think that you are experiencing any of these symptoms. General instructions See your health care provider for regular wellness exams and vaccines. This may include: Scheduling regular health, dental, and eye exams. Getting and maintaining your vaccines. These include: Influenza vaccine. Get this vaccine each year before the flu season begins. Pneumonia vaccine. Shingles vaccine. Tetanus, diphtheria, and pertussis (Tdap) booster vaccine. Your health care provider may also recommend  other immunizations. Tell your health care provider if you have ever been abused or do not feel safe at home. Summary Menopause is a normal process in which your ability to get pregnant comes to an end. This condition causes hot flashes, night sweats, decreased interest in sex, mood swings, headaches, or lack of sleep. Treatment for this condition may include hormone replacement therapy. Take actions to keep yourself healthy, including exercising regularly, eating a healthy diet, watching your weight, and checking your blood pressure and blood sugar levels. Get screened for cancer and depression. Make sure that you are up to date with all your vaccines. This information is not intended to replace advice given to you by your health care provider. Make sure you discuss any questions you have with your health care provider. Document Revised: 04/27/2021 Document Reviewed: 04/27/2021 Elsevier Patient Education  [employer].

## 2022-05-11 NOTE — Progress Notes (Signed)
Subjective:    Patient ID: Krista Gentry, female    DOB: [birth year], 76 y.o.   MRN: [id number]  CC: Krista Gentry is a 77 y.o. female who presents today for physical exam.    HPI: Feels well today No new complaints    History of basal cell carcinoma. Follows with Dr Laurence Ferrari.  Colorectal Cancer Screening: UTD , abstracted Breast Cancer Screening: Mammogram UTD Cervical Cancer Screening: No longer screening for cervical cancer.for noncancerous reason, fibroids. She has bilateral oophorectomy.  Bone Health screening/DEXA for 65+: due  Lung Cancer Screening: Doesn't have 20 year pack year history and age > 46 years yo 60 years  AAA screen : due for as former smoker        Tetanus - UTD  Labs: Screening labs today. Exercise: Gets regular exercise with exercise bike, chair excercises Alcohol use:  occassional Smoking/tobacco use: former smoker.     HISTORY:  Past Medical History:  Diagnosis Date   Allergy    Basal cell carcinoma 10/08/2020   right lateral calf   Family history of adverse reaction to anesthesia    sister had difficult waking up from anesthesia . had pseudo cholinestrase    Past Surgical History:  Procedure Laterality Date   ABDOMINAL HYSTERECTOMY  1994   for noncancerous reason, fibroids. She has bilateral oophorectomy.   BILATERAL OOPHORECTOMY     BREAST BIOPSY     CARPAL TUNNEL RELEASE Right 10/23/2020   Procedure: RIGHT CARPAL TUNNEL RELEASE and LEFT THUMB TRIGGER INJECTION;  Surgeon: Thornton Park, MD;  Location: [facility] OR[facility];  Service: Orthopedics;  Laterality: Right;   CHOLECYSTECTOMY  1994   Family History  Problem Relation Age of Onset   Kidney disease Mother        nephrolithiasis   Dementia Mother    Hypertension Mother    Kidney Stones Mother    Mental retardation Sister 8       possible autism,  now with depression   Dementia Sister    Cancer Maternal Aunt        breast cancer   Dementia Maternal Aunt    Mental retardation Maternal  Aunt    Breast cancer Maternal Aunt    Dementia Maternal Uncle    Cancer Maternal Grandmother        breast cancer   Breast cancer Maternal Grandmother    Heart attack Paternal Grandmother    Kidney Stones Brother    Cancer Maternal Grandfather        Stomach cancer   Colon cancer Neg Hx       ALLERGIES: Patient has no known allergies.  Current Outpatient Medications on File Prior to Visit  Medication Sig Dispense Refill   acetaminophen (TYLENOL) 325 MG tablet Take 1-2 tablets (325-650 mg total) by mouth every 6 (six) hours as needed. 60 tablet 1   ACETYLCARNITINE PO Take 1,500 mg by mouth.     Barberry-Oreg Grape-Goldenseal (BERBERINE COMPLEX PO) Take 500 mcg by mouth daily.     cetirizine (ZYRTEC) 10 MG tablet Take 10 mg by mouth daily as needed for allergies.      Cholecalciferol (VITAMIN D) 50 MCG (2000 UT) tablet Take 6,000 Units by mouth daily.     CRANBERRY EXTRACT PO Take 4,200 mg by mouth daily.      Menaquinone-7 (VITAMIN K2 PO) Take 600 mcg by mouth daily.     mometasone (ELOCON) 0.1 % cream Apply 1 application topically daily. Appear pea sized ( or less) amount  to external ear, and slightly in canal. 15 g 1   Multiple Minerals-Vitamins (CITRACAL MAXIMUM PLUS) TABS Take 2 tablets by mouth daily.      Multiple Vitamins-Minerals (IMMUNE SUPPORT PO) Take 2 tablets by mouth daily.     OVER THE COUNTER MEDICATION Take 1 tablet by mouth daily. Nutratherm otc supplement     Quercetin 250 MG TABS Take 250 mg by mouth daily.      TURMERIC-GINGER PO Take by mouth.     UNABLE TO FIND cbd 20 mg     Zinc 50 MG CAPS Take 50 mg by mouth daily.      No current facility-administered medications on file prior to visit.    Social History   Tobacco Use   Smoking status: Former    Packs/day: 1.00    Years: 5.00    Pack years: 5.00    Types: Cigarettes    Quit date: 04/14/1970    Years since quitting: 52.1   Smokeless tobacco: Never  Vaping Use   Vaping Use: Never used   Substance Use Topics   Alcohol use: Yes    Alcohol/week: 3.0 standard drinks    Types: 2 Glasses of wine, 1 Cans of beer per week    Comment: occassion   Drug use: No    Review of Systems  Constitutional:  Negative for chills, fever and unexpected weight change.  HENT:  Negative for congestion.   Respiratory:  Negative for cough.   Cardiovascular:  Negative for chest pain, palpitations and leg swelling.  Gastrointestinal:  Negative for nausea and vomiting.  Genitourinary:  Negative for pelvic pain.  Musculoskeletal:  Negative for arthralgias and myalgias.  Skin:  Negative for rash.  Neurological:  Negative for headaches.  Hematological:  Negative for adenopathy.  Psychiatric/Behavioral:  Negative for confusion.      Objective:    BP 128/78   Pulse 68   Temp 97.7 F (36.5 C) (Oral)   Ht '5\' 4"'$  (1.626 m)   Wt 224 lb 9.6 oz (101.9 kg)   SpO2 98%   BMI 38.55 kg/m   BP Readings from Last 3 Encounters:  05/11/22 128/78  11/10/21 120/80  08/31/21 122/72   Wt Readings from Last 3 Encounters:  05/11/22 224 lb 9.6 oz (101.9 kg)  04/22/22 227 lb (103 kg)  11/10/21 227 lb 3.2 oz (103.1 kg)    Physical Exam Vitals reviewed.  Constitutional:      Appearance: Normal appearance. She is well-developed.  Eyes:     Conjunctiva/sclera: Conjunctivae normal.  Neck:     Thyroid: No thyroid mass or thyromegaly.  Cardiovascular:     Rate and Rhythm: Normal rate and regular rhythm.     Pulses: Normal pulses.     Heart sounds: Normal heart sounds.  Pulmonary:     Effort: Pulmonary effort is normal.     Breath sounds: Normal breath sounds. No wheezing, rhonchi or rales.  Chest:  Breasts:    Breasts are symmetrical.     Right: No inverted nipple, mass, nipple discharge, skin change or tenderness.     Left: No inverted nipple, mass, nipple discharge, skin change or tenderness.  Abdominal:     General: Bowel sounds are normal. There is no distension.     Palpations: Abdomen is  soft. Abdomen is not rigid. There is no fluid wave, mass or pulsatile mass.     Tenderness: There is no abdominal tenderness. There is no guarding or rebound.     Comments:  No abdominal bruit  Lymphadenopathy:     Head:     Right side of head: No submental, submandibular, tonsillar, preauricular, posterior auricular or occipital adenopathy.     Left side of head: No submental, submandibular, tonsillar, preauricular, posterior auricular or occipital adenopathy.     Cervical: No cervical adenopathy.     Right cervical: No superficial, deep or posterior cervical adenopathy.    Left cervical: No superficial, deep or posterior cervical adenopathy.  Skin:    General: Skin is warm and dry.  Neurological:     Mental Status: She is alert.  Psychiatric:        Speech: Speech normal.        Behavior: Behavior normal.        Thought Content: Thought content normal.       Assessment & Plan:   Problem List Items Addressed This Visit       Other   Routine general medical examination at a health care facility - Primary    Mammogram and bone density will be due later this year; patient will call to schedule these in the Fall.  Encourage continued exercise.  Unable to see official reportof  Cologuard and have asked CMA to print from [employer] for my review. AAA screen ordered due to h/o smoking.        Relevant Orders   Hemoglobin A1c   TSH   CBC with Differential/Platelet   Comprehensive metabolic panel   Lipid panel   Other Visit Diagnoses     Vitamin D deficiency       Relevant Orders   VITAMIN D 25 Hydroxy (Vit-D Deficiency, Fractures)   Former smoker       Relevant Orders   US AORTA DUPLEX LIMITED        I am having Foy [place]. Gomez maintain her CRANBERRY EXTRACT PO, cetirizine, Zinc, Vitamin D, Menaquinone-7 (VITAMIN K2 PO), Quercetin, Citracal Maximum Plus, Multiple Vitamins-Minerals (IMMUNE SUPPORT PO), OVER THE COUNTER MEDICATION, Barberry-Oreg Grape-Goldenseal  (BERBERINE COMPLEX PO), TURMERIC-GINGER PO, ACETYLCARNITINE PO, acetaminophen, UNABLE TO FIND, and mometasone.   No orders of the defined types were placed in this encounter.   Return precautions given.   Risks, benefits, and alternatives of the medications and treatment plan prescribed today were discussed, and patient expressed understanding.   Education regarding symptom management and diagnosis given to patient on AVS.   Continue to follow with Burnard Hawthorne, FNP for routine health maintenance.   Johney Frame and I agreed with plan.   Mable Paris, FNP

## 2022-05-12 NOTE — Telephone Encounter (Signed)
I printed the results and put it in your red folder for you to sign.  Cuca Benassi,cma

## 2022-05-13 ENCOUNTER — Ambulatory Visit (INDEPENDENT_AMBULATORY_CARE_PROVIDER_SITE_OTHER): Payer: Medicare Other

## 2022-05-13 ENCOUNTER — Encounter: Payer: Self-pay | Admitting: Family

## 2022-05-13 DIAGNOSIS — E538 Deficiency of other specified B group vitamins: Secondary | ICD-10-CM

## 2022-05-13 MED ORDER — CYANOCOBALAMIN 1000 MCG/ML IJ SOLN
1000.0000 ug | Freq: Once | INTRAMUSCULAR | Status: AC
  Administered 2022-05-13: 1000 ug via INTRAMUSCULAR

## 2022-05-13 NOTE — Progress Notes (Signed)
Krista Gentry presents today for injection per MD orders. B12 injection administered IM in right Upper Arm. Administration without incident. Patient tolerated well.  Bralon Antkowiak,cma

## 2022-05-14 ENCOUNTER — Encounter: Payer: Self-pay | Admitting: Family

## 2022-05-14 ENCOUNTER — Ambulatory Visit: Payer: Medicare Other | Admitting: Family

## 2022-05-14 ENCOUNTER — Ambulatory Visit
Admission: RE | Admit: 2022-05-14 | Discharge: 2022-05-14 | Disposition: A | Discharge status: Home / Self Care | Payer: Medicare Other | Source: Ambulatory Visit | Attending: Family | Admitting: Family

## 2022-05-14 VITALS — BP 124/68 | HR 82 | Temp 98.9°F | Ht 64.0 in | Wt 226.6 lb

## 2022-05-14 DIAGNOSIS — M7121 Synovial cyst of popliteal space [Baker], right knee: Secondary | ICD-10-CM | POA: Diagnosis not present

## 2022-05-14 DIAGNOSIS — S8011XA Contusion of right lower leg, initial encounter: Secondary | ICD-10-CM

## 2022-05-14 DIAGNOSIS — R600 Localized edema: Secondary | ICD-10-CM | POA: Diagnosis not present

## 2022-05-14 NOTE — Progress Notes (Signed)
Subjective:    Patient ID: Krista Gentry, female    DOB: [birth year], 76 y.o.   MRN: [id number]  CC: Krista Gentry is a 76 y.o. female who presents today for an acute visit.    HPI: Complains of right 'knot' behind right knee noticed 3 days ago, unchanged.  Not draining.  No tick bite, fall, fever.  Chronic bilateral knee pain is unchanged.  She is not on anticoagulation She reports bilateral knee xrays obtained by [facility] 12/2020 ( unable to see report). She has injections for knee pain in the past.  No h/o DVT   HISTORY:  Past Medical History:  Diagnosis Date   Allergy    Basal cell carcinoma 10/08/2020   right lateral calf   Family history of adverse reaction to anesthesia    sister had difficult waking up from anesthesia . had pseudo cholinestrase   Past Surgical History:  Procedure Laterality Date   [place]   for noncancerous reason, fibroids. She has bilateral oophorectomy.   BILATERAL OOPHORECTOMY     BREAST BIOPSY     CARPAL TUNNEL RELEASE Right 10/23/2020   Procedure: RIGHT CARPAL TUNNEL RELEASE and LEFT THUMB TRIGGER INJECTION;  Surgeon: Thornton Park, MD;  Location: [facility];  Service: Orthopedics;  Laterality: Right;   CHOLECYSTECTOMY  1994   Family History  Problem Relation Age of Onset   Kidney disease Mother        nephrolithiasis   Dementia Mother    Hypertension Mother    Kidney Stones Mother    Mental retardation Sister 8       possible autism,  now with depression   Dementia Sister    Cancer Maternal Aunt        breast cancer   Dementia Maternal Aunt    Mental retardation Maternal Aunt    Breast cancer Maternal Aunt    Dementia Maternal Uncle    Cancer Maternal Grandmother        breast cancer   Breast cancer Maternal Grandmother    Heart attack Paternal Grandmother    Kidney Stones Brother    Cancer Maternal Grandfather        Stomach cancer   Colon cancer Neg Hx     Allergies: Patient has no known  allergies. Current Outpatient Medications on File Prior to Visit  Medication Sig Dispense Refill   acetaminophen (TYLENOL) 325 MG tablet Take 1-2 tablets (325-650 mg total) by mouth every 6 (six) hours as needed. 60 tablet 1   ACETYLCARNITINE PO Take 1,500 mg by mouth.     Barberry-Oreg Grape-Goldenseal (BERBERINE COMPLEX PO) Take 500 mcg by mouth daily.     cetirizine (ZYRTEC) 10 MG tablet Take 10 mg by mouth daily as needed for allergies.      Cholecalciferol (VITAMIN D) 50 MCG (2000 UT) tablet Take 6,000 Units by mouth daily.     CRANBERRY EXTRACT PO Take 4,200 mg by mouth daily.      Menaquinone-7 (VITAMIN K2 PO) Take 600 mcg by mouth daily.     mometasone (ELOCON) 0.1 % cream Apply 1 application topically daily. Appear pea sized ( or less) amount to external ear, and slightly in canal. 15 g 1   Multiple Minerals-Vitamins (CITRACAL MAXIMUM PLUS) TABS Take 2 tablets by mouth daily.      Multiple Vitamins-Minerals (IMMUNE SUPPORT PO) Take 2 tablets by mouth daily.     OVER THE COUNTER MEDICATION Take 1 tablet by mouth daily. Nutratherm otc supplement  Quercetin 250 MG TABS Take 250 mg by mouth daily.      TURMERIC-GINGER PO Take by mouth.     UNABLE TO FIND cbd 20 mg     Zinc 50 MG CAPS Take 50 mg by mouth daily.      No current facility-administered medications on file prior to visit.    Social History   Tobacco Use   Smoking status: Former    Packs/day: 1.00    Years: 5.00    Pack years: 5.00    Types: Cigarettes    Quit date: 04/14/1970    Years since quitting: 52.1   Smokeless tobacco: Never  Vaping Use   Vaping Use: Never used  Substance Use Topics   Alcohol use: Yes    Alcohol/week: 3.0 standard drinks    Types: 2 Glasses of wine, 1 Cans of beer per week    Comment: occassion   Drug use: No    Review of Systems  Constitutional:  Negative for chills and fever.  Respiratory:  Negative for cough and shortness of breath.   Cardiovascular:  Negative for chest pain,  palpitations and leg swelling.  Gastrointestinal:  Negative for nausea and vomiting.  Musculoskeletal:  Positive for arthralgias (chronic knee pain).     Objective:    BP 124/68   Pulse 82   Temp 98.9 F (37.2 C) (Oral)   Ht '5\' 4"'$  (1.626 m)   Wt 226 lb 9.6 oz (102.8 kg)   SpO2 97%   BMI 38.90 kg/m    Physical Exam Vitals reviewed.  Constitutional:      Appearance: She is well-developed.  Eyes:     Conjunctiva/sclera: Conjunctivae normal.  Cardiovascular:     Rate and Rhythm: Normal rate and regular rhythm.     Pulses: Normal pulses.     Heart sounds: Normal heart sounds.     Comments: No LE edema, palpable cords or masses. No erythema or increased warmth. No asymmetry in calf size when compared bilaterally LE hair growth symmetric and present. Varicosities noted. LE warm and palpable pedal pulses.  Pulmonary:     Effort: Pulmonary effort is normal.     Breath sounds: Normal breath sounds. No wheezing, rhonchi or rales.  Skin:    General: Skin is warm and dry.     Comments: Right posterior leg shows ecchymosis. symmetrical to left posterior leg.  No discrete mass palpated.  Varicosities are present  Neurological:     Mental Status: She is alert.  Psychiatric:        Speech: Speech normal.        Behavior: Behavior normal.        Thought Content: Thought content normal.        Assessment & Plan:   Problem List Items Addressed This Visit       Other   Leg hematoma, right, initial encounter - Primary    Suspected hematoma. Varicosities present, and question if related. Differential also includes bakers cyst, lipoma. Pending soft tissue US and bilateral US to ensure no DVT.          Relevant Orders   US Venous Img Lower Bilateral (DVT)   [place] RT LOWER EXTREM LTD SOFT TISSUE NON VASCULAR      I am having Krista Gentry maintain her CRANBERRY EXTRACT PO, cetirizine, Zinc, Vitamin D, Menaquinone-7 (VITAMIN K2 PO), Quercetin, Citracal Maximum Plus, Multiple  Vitamins-Minerals (IMMUNE SUPPORT PO), OVER THE COUNTER MEDICATION, Barberry-Oreg Grape-Goldenseal (BERBERINE COMPLEX PO), TURMERIC-GINGER PO, ACETYLCARNITINE  PO, acetaminophen, UNABLE TO FIND, and mometasone.   No orders of the defined types were placed in this encounter.   Return precautions given.   Risks, benefits, and alternatives of the medications and treatment plan prescribed today were discussed, and patient expressed understanding.   Education regarding symptom management and diagnosis given to patient on AVS.  Continue to follow with Burnard Hawthorne, FNP for routine health maintenance.   Johney Frame and I agreed with plan.   Mable Paris, FNP

## 2022-05-14 NOTE — Patient Instructions (Signed)
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   Pending ultrasound  Let me know of any new concerns

## 2022-05-14 NOTE — Assessment & Plan Note (Addendum)
Suspected hematoma. Varicosities present, and question if related. Differential also includes bakers cyst, lipoma. Pending soft tissue US and bilateral US to ensure no DVT.

## 2022-05-21 ENCOUNTER — Ambulatory Visit
Admission: RE | Admit: 2022-05-21 | Discharge: 2022-05-21 | Disposition: A | Discharge status: Home / Self Care | Payer: Medicare Other | Source: Ambulatory Visit | Attending: Family | Admitting: Family

## 2022-05-21 DIAGNOSIS — Z87891 Personal history of nicotine dependence: Secondary | ICD-10-CM | POA: Diagnosis not present

## 2022-05-21 DIAGNOSIS — Z136 Encounter for screening for cardiovascular disorders: Secondary | ICD-10-CM | POA: Diagnosis not present

## 2022-05-31 ENCOUNTER — Encounter: Payer: Self-pay | Admitting: Family

## 2022-06-14 ENCOUNTER — Ambulatory Visit (INDEPENDENT_AMBULATORY_CARE_PROVIDER_SITE_OTHER): Payer: Medicare Other

## 2022-06-14 DIAGNOSIS — E538 Deficiency of other specified B group vitamins: Secondary | ICD-10-CM

## 2022-06-14 MED ORDER — CYANOCOBALAMIN 1000 MCG/ML IJ SOLN
1000.0000 ug | Freq: Once | INTRAMUSCULAR | Status: AC
  Administered 2022-06-14: 1000 ug via INTRAMUSCULAR

## 2022-06-14 NOTE — Progress Notes (Addendum)
Patient presented for B 12 injection to left deltoid, patient voiced no concerns nor showed any signs of distress during injection. 

## 2022-07-15 ENCOUNTER — Ambulatory Visit (INDEPENDENT_AMBULATORY_CARE_PROVIDER_SITE_OTHER): Payer: Medicare Other

## 2022-07-15 DIAGNOSIS — E538 Deficiency of other specified B group vitamins: Secondary | ICD-10-CM

## 2022-07-15 MED ORDER — CYANOCOBALAMIN 1000 MCG/ML IJ SOLN
1000.0000 ug | Freq: Once | INTRAMUSCULAR | Status: AC
  Administered 2022-07-15: 1000 ug via INTRAMUSCULAR

## 2022-07-15 NOTE — Progress Notes (Signed)
presents today for injection per MD orders. B12 injection administered IM in left Upper Arm. Administration without incident. Patient tolerated well.  Ozell Ferrera,cma   

## 2022-08-17 ENCOUNTER — Ambulatory Visit: Payer: Medicare Other

## 2022-08-17 ENCOUNTER — Ambulatory Visit (INDEPENDENT_AMBULATORY_CARE_PROVIDER_SITE_OTHER): Payer: Medicare Other

## 2022-08-17 VITALS — Wt 202.0 lb

## 2022-08-17 DIAGNOSIS — E538 Deficiency of other specified B group vitamins: Secondary | ICD-10-CM

## 2022-08-17 MED ORDER — CYANOCOBALAMIN 1000 MCG/ML IJ SOLN
1000.0000 ug | Freq: Once | INTRAMUSCULAR | Status: AC
  Administered 2022-08-17: 1000 ug via INTRAMUSCULAR

## 2022-08-17 NOTE — Progress Notes (Signed)
Pt presented today for b12 injection. Right deltoid, IM. Pt voiced no concerns nor showed any signs of distress during injection.  

## 2022-08-31 DIAGNOSIS — H25043 Posterior subcapsular polar age-related cataract, bilateral: Secondary | ICD-10-CM | POA: Diagnosis not present

## 2022-08-31 DIAGNOSIS — H18593 Other hereditary corneal dystrophies, bilateral: Secondary | ICD-10-CM | POA: Diagnosis not present

## 2022-08-31 DIAGNOSIS — H18413 Arcus senilis, bilateral: Secondary | ICD-10-CM | POA: Diagnosis not present

## 2022-08-31 DIAGNOSIS — H2513 Age-related nuclear cataract, bilateral: Secondary | ICD-10-CM | POA: Diagnosis not present

## 2022-09-17 ENCOUNTER — Ambulatory Visit: Payer: Medicare Other

## 2022-09-21 ENCOUNTER — Ambulatory Visit (INDEPENDENT_AMBULATORY_CARE_PROVIDER_SITE_OTHER): Payer: Medicare Other

## 2022-09-21 DIAGNOSIS — E538 Deficiency of other specified B group vitamins: Secondary | ICD-10-CM

## 2022-09-21 MED ORDER — CYANOCOBALAMIN 1000 MCG/ML IJ SOLN
1000.0000 ug | Freq: Once | INTRAMUSCULAR | Status: AC
  Administered 2022-09-21: 1000 ug via INTRAMUSCULAR

## 2022-09-21 NOTE — Progress Notes (Signed)
Pt arrived for B12 injection, given in L deltoid. Pt tolerated injection well, showed no signs of distress nor voiced any concerns.  ?

## 2022-10-13 ENCOUNTER — Encounter: Payer: Self-pay | Admitting: Dermatology

## 2022-10-13 ENCOUNTER — Ambulatory Visit (INDEPENDENT_AMBULATORY_CARE_PROVIDER_SITE_OTHER): Payer: Medicare Other | Admitting: Dermatology

## 2022-10-13 DIAGNOSIS — D492 Neoplasm of unspecified behavior of bone, soft tissue, and skin: Secondary | ICD-10-CM

## 2022-10-13 DIAGNOSIS — L821 Other seborrheic keratosis: Secondary | ICD-10-CM

## 2022-10-13 DIAGNOSIS — L905 Scar conditions and fibrosis of skin: Secondary | ICD-10-CM | POA: Diagnosis not present

## 2022-10-13 DIAGNOSIS — Z85828 Personal history of other malignant neoplasm of skin: Secondary | ICD-10-CM

## 2022-10-13 DIAGNOSIS — L814 Other melanin hyperpigmentation: Secondary | ICD-10-CM

## 2022-10-13 DIAGNOSIS — D485 Neoplasm of uncertain behavior of skin: Secondary | ICD-10-CM | POA: Diagnosis not present

## 2022-10-13 DIAGNOSIS — L578 Other skin changes due to chronic exposure to nonionizing radiation: Secondary | ICD-10-CM | POA: Diagnosis not present

## 2022-10-13 DIAGNOSIS — D2372 Other benign neoplasm of skin of left lower limb, including hip: Secondary | ICD-10-CM

## 2022-10-13 DIAGNOSIS — Z1283 Encounter for screening for malignant neoplasm of skin: Secondary | ICD-10-CM

## 2022-10-13 DIAGNOSIS — R202 Paresthesia of skin: Secondary | ICD-10-CM | POA: Diagnosis not present

## 2022-10-13 DIAGNOSIS — D229 Melanocytic nevi, unspecified: Secondary | ICD-10-CM

## 2022-10-13 MED ORDER — MUPIROCIN 2 % EX OINT: 1.0000 | TOPICAL_OINTMENT | Freq: Every day | CUTANEOUS | 0 refills | Status: AC

## 2022-10-13 NOTE — Progress Notes (Signed)
Follow-Up Visit   Subjective  Krista Gentry is a 76 y.o. female who presents for the following: Annual Exam (Hx BCC).  The patient presents for Total-Body Skin Exam (TBSE) for skin cancer screening and mole check.  The patient has spots, moles and lesions to be evaluated, some may be new or changing and the patient has concerns that these could be cancer.   The following portions of the chart were reviewed this encounter and updated as appropriate:   Tobacco  Allergies  Meds  Problems  Med Hx  Surg Hx  Fam Hx      Review of Systems:  No other skin or systemic complaints except as noted in HPI or Assessment and Plan.  Objective  Well appearing patient in no apparent distress; mood and affect are within normal limits.  A full examination was performed including scalp, head, eyes, ears, nose, lips, neck, chest, axillae, abdomen, back, buttocks, bilateral upper extremities, bilateral lower extremities, hands, feet, fingers, toes, fingernails, and toenails. All findings within normal limits unless otherwise noted below.  Right Upper Back Hyperpigmentation, excoriations  right inferior buttock 0.4 cm brown macule within scar [id number]  right medial buttock 0.3 cm brown macule with surrounding white halo    Assessment & Plan  Notalgia paresthetica Right Upper Back  Notalgia paresthetica is a chronic condition affecting the skin of the back in which a pinched nerve along the spine causes itching or changes in sensation in an area of skin. This is usually accompanied by chronic rubbing or scratching often leaving the area of skin discolored and thickened. There is no cure, but there are some treatments which may help control the itch.   Over the counter (non-prescription) treatments for notalgia paresthetica include numbing creams like pramoxine or lidocaine which temporarily reduce itch or Capsaicin-containing creams which cause a burning sensation but which sometimes over  time will reset the nerves to stop producing itch.  If you choose to use Capsaicin cream, it is recommended to use it 5 times daily for 1 week followed by 3 times daily for 3-6 weeks. You may have to continue using it long-term. For severe cases, there are some prescription cream or pill options which may help.  Neoplasm of skin (2) right inferior buttock  Epidermal / dermal shaving  Lesion diameter (cm):  0.4 Informed consent: discussed and consent obtained   Timeout: patient name, date of birth, surgical site, and procedure verified   Anesthesia: the lesion was anesthetized in a standard fashion   Anesthetic:  1% lidocaine w/ epinephrine 1-100,000 local infiltration Instrument used: flexible razor blade   Hemostasis achieved with: aluminum chloride   Outcome: patient tolerated procedure well   Post-procedure details: wound care instructions given   Additional details:  Mupirocin and a bandage applied  mupirocin ointment (BACTROBAN) 2 % Apply 1 Application topically daily.  Specimen 1 - Surgical pathology Differential Diagnosis: r/o recurrent nevus vs Atypia  Check Margins: No 0.4 cm brown macule within scar [id number] with moderate atypia  right medial buttock  Epidermal / dermal shaving  Lesion diameter (cm):  0.3 Informed consent: discussed and consent obtained   Timeout: patient name, date of birth, surgical site, and procedure verified   Anesthesia: the lesion was anesthetized in a standard fashion   Anesthetic:  1% lidocaine w/ epinephrine 1-100,000 local infiltration Instrument used: flexible razor blade   Hemostasis achieved with: aluminum chloride   Outcome: patient tolerated procedure well   Post-procedure details: wound care instructions given  Additional details:  Mupirocin and a bandage applied  Specimen 2 - Surgical pathology Differential Diagnosis: r/o Atypia vs Halo Nevus  Check Margins: No 0.3 cm brown macule with surrounding white halo   History  of Basal Cell Carcinoma of the Skin - No evidence of recurrence today at right lateral calf - Recommend regular full body skin exams - Recommend daily broad spectrum sunscreen SPF 30+ to sun-exposed areas, reapply every 2 hours as needed.  - Call if any new or changing lesions are noted between office visits  Lentigines - Scattered tan macules - Due to sun exposure - Benign-appearing, observe - Recommend daily broad spectrum sunscreen SPF 30+ to sun-exposed areas, reapply every 2 hours as needed. - Call for any changes  Seborrheic Keratoses - Stuck-on, waxy, tan-brown papules and/or plaques  - Benign-appearing - Discussed benign etiology and prognosis. - Observe - Call for any changes  Melanocytic Nevi - Tan-brown and/or pink-flesh-colored symmetric macules and papules - Benign appearing on exam today - Observation - Call clinic for new or changing moles - Recommend daily use of broad spectrum spf 30+ sunscreen to sun-exposed areas.   Hemangiomas - Red papules - Discussed benign nature - Observe - Call for any changes  Actinic Damage - Chronic condition, secondary to cumulative UV/sun exposure - diffuse scaly erythematous macules with underlying dyspigmentation - Recommend daily broad spectrum sunscreen SPF 30+ to sun-exposed areas, reapply every 2 hours as needed.  - Staying in the shade or wearing long sleeves, sun glasses (UVA+UVB protection) and wide brim hats (4-inch brim around the entire circumference of the hat) are also recommended for sun protection.  - Call for new or changing lesions.  Skin cancer screening performed today.  Dermatofibroma - Firm pink/brown papulenodule with dimple sign at left lateral calf - Benign appearing - Call for any changes  Return in about 1 year (around 10/14/2023) for TBSE, Hx BCC, Hx Dysplastic Nevi.  Graciella Belton, RMA, am acting as scribe for [name], MD .  Documentation: I have reviewed the above documentation for  accuracy and completeness, and I agree with the above.  [name], MD

## 2022-10-13 NOTE — Patient Instructions (Addendum)
Recommend OTC Gold Bond Rapid Relief Anti-Itch cream (pramoxine + menthol), CeraVe Anti-itch cream or lotion (pramoxine), Sarna lotion (Original- menthol + camphor or Sensitive- pramoxine) or Eucerin 12 hour Itch Relief lotion (menthol) up to 3 times per day to areas on body that are itchy.  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 This is usually accompanied by chronic rubbing or scratching often leaving the area of skin discolored and thickened. There is no cure, but there are some treatments which may help control the itch.   Over the counter (non-prescription) treatments for notalgia paresthetica include numbing creams like pramoxine or lidocaine which temporarily reduce itch or Capsaicin-containing creams which cause a burning sensation but which sometimes over time will reset the nerves to stop producing itch.  If you choose to use Capsaicin cream, it is recommended to use it 5 times daily for 1 week followed by 3 times daily for 3-6 weeks. You may have to continue using it long-term. For severe cases, there are some prescription cream or pill options which may help. Other treatment options include: - Transcutaneous Electrical Nerve Stimulation (TENS) - Gabapentin 300-900 mg daily po - Amitriptyline orally - Paravertebral local anesthetic block - intralesional Botulinum toxin A  Wound Care Instructions  Cleanse wound gently with soap and water once a day then pat dry with clean gauze. Apply a thin coat of Petrolatum (petroleum jelly, "Vaseline") over the wound (unless you have an allergy to this). We recommend that you use a new, sterile tube of Vaseline. Do not pick or remove scabs. Do not remove the yellow or white "healing tissue" from the base of the wound.  Cover the wound with fresh, clean, nonstick gauze and secure with paper tape. You may use Band-Aids  in place of gauze and tape if the wound is small enough, but would recommend trimming much of the tape off as there is often too much. Sometimes Band-Aids can irritate the skin.  You should call the office for your biopsy report after 1 week if you have not already been contacted.  If you experience any problems, such as abnormal amounts of bleeding, swelling, significant bruising, significant pain, or evidence of infection, please call the office immediately.  FOR ADULT SURGERY PATIENTS: If you need something for pain relief you may take 1 extra strength Tylenol (acetaminophen) AND 2 Ibuprofen ('200mg'$  each) together every 4 hours as needed for pain. (do not take these if you are allergic to them or if you have a reason you should not take them.) Typically, you may only need pain medication for 1 to 3 days.   Recommend taking Heliocare sun protection supplement daily in sunny weather for additional sun protection. For maximum protection on the sunniest days, you can take up to 2 capsules of regular Heliocare OR take 1 capsule of Heliocare Ultra. For prolonged exposure (such as a full day in the sun), you can repeat your dose of the supplement 4 hours after your first dose. Heliocare can be purchased at [employer], at some [facility] or at [email or web address].    Melanoma ABCDEs  Melanoma is the most dangerous type of skin cancer, and is the leading cause of death from skin disease.  You are more likely to develop melanoma if you: Have light-colored skin, light-colored eyes, or red or blond hair Spend a lot of time in the  sun Tan regularly, either outdoors or in a tanning bed Have had blistering sunburns, especially during childhood Have a close family member who has had a melanoma Have atypical moles or large birthmarks  Early detection of melanoma is key since treatment is typically straightforward and cure rates are extremely high if we catch it early.   The first sign of melanoma is  often a change in a mole or a new dark spot.  The ABCDE system is a way of remembering the signs of melanoma.  A for asymmetry:  The two halves do not match. B for border:  The edges of the growth are irregular. C for color:  A mixture of colors are present instead of an even brown color. D for diameter:  Melanomas are usually (but not always) greater than 53m - the size of a pencil eraser. E for evolution:  The spot keeps changing in size, shape, and color.  Please check your skin once per month between visits. You can use a small mirror in front and a large mirror behind you to keep an eye on the back side or your body.   If you see any new or changing lesions before your next follow-up, please call to schedule a visit.  Please continue daily skin protection including broad spectrum sunscreen SPF 30+ to sun-exposed areas, reapplying every 2 hours as needed when you're outdoors.    Due to recent changes in healthcare laws, you may see results of your pathology and/or laboratory studies on MyChart before the doctors have had a chance to review them. We understand that in some cases there may be results that are confusing or concerning to you. Please understand that not all results are received at the same time and often the doctors may need to interpret multiple results in order to provide you with the best plan of care or course of treatment. Therefore, we ask that you please give u[place]2 business days to thoroughly review all your results before contacting the office for clarification. Should we see a critical lab result, you will be contacted sooner.   If You Need Anything After Your Visit  If you have any questions or concerns for your doctor, please call our main line at [phone] press option 4 to reach your doctor's medical assistant. If no one answers, please leave a voicemail as directed and we will return your call as soon as possible. Messages left after 4 pm will be answered the  following business day.   You may also send [place] message via [place] We typically respond to MyChart messages within 1-2 business days.  For prescription refills, please ask your pharmacy to contact our office. Our fax number is [phone]  If you have an urgent issue when the clinic is closed that cannot wait until the next business day, you can page your doctor at the number below.    Please note that while we do our best to be available for urgent issues outside of office hours, we are not available 24/7.   If you have an urgent issue and are unable to reach u[place] you may choose to seek medical care at your doctor's office, retail clinic, urgent care center, or emergency room.  If you have a medical emergency, please immediately call 911 or go to the emergency department.  Pager Numbers  - Dr. KNehemiah Massed [phone] - Dr. MLaurence Ferrari [phone] - Dr. SNicole Kindred [phone] In the event of inclement weather, please call  our main line at [phone] for an update on the status of any delays or closures.  Dermatology Medication Tips: Please keep the boxes that topical medications come in in order to help keep track of the instructions about where and how to use these. Pharmacies typically print the medication instructions only on the boxes and not directly on the medication tubes.   If your medication is too expensive, please contact our office at [phone] option 4 or send [place] a message through [place].   We are unable to tell what your co-pay for medications will be in advance as this is different depending on your insurance coverage. However, we may be able to find a substitute medication at lower cost or fill out paperwork to get insurance to cover a needed medication.   If a prior authorization is required to get your medication covered by your insurance company, please allow [place] 1-2 business days to complete this process.  Drug prices often vary depending on where the  prescription is filled and some pharmacies may offer cheaper prices.  The website www.goodrx.com contains coupons for medications through different pharmacies. The prices here do not account for what the cost may be with help from insurance (it may be cheaper with your insurance), but the website can give you the price if you did not use any insurance.  - You can print the associated coupon and take it with your prescription to the pharmacy.  - You may also stop by our office during regular business hours and pick up a GoodRx coupon card.  - If you need your prescription sent electronically to a different pharmacy, notify our office through [facility] or by phone at [phone] option 4.     Si Usted Necesita Algo Despus de Su Visita  Tambin puede enviarnos un mensaje a travs de Pharmacist, community. Por lo general respondemos a los mensajes de MyChart en el transcurso de 1 a 2 das hbiles.  Para renovar recetas, por favor pida a su farmacia que se ponga en contacto con nuestra oficina. Harland Dingwall de fax es [place] [phone].  Si tiene un asunto urgente cuando la clnica est cerrada y que no puede esperar hasta el siguiente da hbil, puede llamar/localizar a su doctor(a) al nmero que aparece a continuacin.   Por favor, tenga en cuenta que aunque hacemos todo lo posible para estar disponibles para asuntos urgentes fuera del horario de [place], no estamos disponibles las 24 horas del da, los 7 das de la [place].   Si tiene un problema urgente y no puede comunicarse con nosotros, puede optar por buscar atencin mdica  en el consultorio de su doctor(a), en una clnica privada, en un centro de atencin urgente o en una sala de emergencias.  Si tiene Engineering geologist, por favor llame inmediatamente al 911 o vaya a la sala de emergencias.  Nmeros de bper  - Dr. Nehemiah Massed: [phone]  - Dra. Moye: [phone]  - Dra. Nicole Kindred: [phone]  En caso de inclemencias del [place],  por favor llame a Johnsie Kindred principal al [phone] para una actualizacin sobre el [place] de cualquier retraso o cierre.  Consejos para la medicacin en dermatologa: Por favor, guarde las cajas en las que vienen los medicamentos de uso tpico para ayudarle a seguir las instrucciones sobre dnde y cmo usarlos. Las farmacias generalmente imprimen las instrucciones del medicamento slo en las cajas y no directamente en los tubos del [place].   Si su medicamento es [employer], por favor,  pngase en contacto con nuestra oficina llamando al [phone] y presione la opcin 4 o envenos un mensaje a travs de Pharmacist, community.   No podemos decirle cul ser su copago por los medicamentos por adelantado ya que esto es diferente dependiendo de la cobertura de su seguro. Sin embargo, es posible que podamos encontrar un medicamento sustituto a Electrical engineer un formulario para que el seguro cubra el medicamento que se considera necesario.   Si se requiere una autorizacin previa para que su compaa de seguros [place] su medicamento, por favor permtanos de 1 a 2 das hbiles para completar este proceso.  Los precios de los medicamentos varan con frecuencia dependiendo del Environmental consultant de dnde se surte la receta y alguna farmacias pueden ofrecer precios ms baratos.  El sitio web www.goodrx.com tiene cupones para medicamentos de Airline pilot. Los precios aqu no tienen en cuenta lo que podra costar con la ayuda del seguro (puede ser ms barato con su seguro), pero el sitio web puede darle el precio si no utiliz Research scientist (physical sciences).  - Puede imprimir el cupn correspondiente y llevarlo con su receta a la farmacia.  - Tambin puede pasar por nuestra oficina durante el horario de atencin regular y Charity fundraiser una tarjeta de cupones de GoodRx.  - Si necesita que su receta se enve electrnicamente a una farmacia diferente, informe a nuestra oficina a travs de MyChart de [place] o por telfono llamando al  [phone] y presione la opcin 4.

## 2022-10-19 ENCOUNTER — Telehealth: Payer: Self-pay

## 2022-10-19 NOTE — Telephone Encounter (Signed)
-----   Message from Alfonso Patten, MD sent at 10/18/2022 11:12 PM EDT ----- 1. Skin , right inferior buttock PIGMENTED SEBORRHEIC KERATOSIS, EARLY OVERLYING DERMAL SCAR\  2. Skin , right medial buttock PIGMENTED SEBORRHEIC KERATOSIS, EARLY  These are benign growth or "wisdom spots" with no atypical cells or cancer under the microscope. No additional treatment is needed.   MAs please call. Thank you!

## 2022-10-19 NOTE — Telephone Encounter (Signed)
Discussed pathology results. Patient voiced understanding.  

## 2022-10-22 ENCOUNTER — Ambulatory Visit (INDEPENDENT_AMBULATORY_CARE_PROVIDER_SITE_OTHER): Payer: Medicare Other | Admitting: *Deleted

## 2022-10-22 DIAGNOSIS — E538 Deficiency of other specified B group vitamins: Secondary | ICD-10-CM

## 2022-10-22 MED ORDER — CYANOCOBALAMIN 1000 MCG/ML IJ SOLN
1000.0000 ug | Freq: Once | INTRAMUSCULAR | Status: AC
  Administered 2022-10-22: 1000 ug via INTRAMUSCULAR

## 2022-10-22 NOTE — Progress Notes (Signed)
Pt  received B12 injection in right deltoid. Pt tolerated it well with no concerns or complaints. 

## 2022-11-02 ENCOUNTER — Other Ambulatory Visit: Payer: Self-pay | Admitting: Family

## 2022-11-02 DIAGNOSIS — Z1231 Encounter for screening mammogram for malignant neoplasm of breast: Secondary | ICD-10-CM

## 2022-11-19 DIAGNOSIS — H2511 Age-related nuclear cataract, right eye: Secondary | ICD-10-CM | POA: Diagnosis not present

## 2022-11-19 DIAGNOSIS — H2512 Age-related nuclear cataract, left eye: Secondary | ICD-10-CM | POA: Diagnosis not present

## 2022-11-22 ENCOUNTER — Ambulatory Visit (INDEPENDENT_AMBULATORY_CARE_PROVIDER_SITE_OTHER): Payer: Medicare Other

## 2022-11-22 DIAGNOSIS — E538 Deficiency of other specified B group vitamins: Secondary | ICD-10-CM

## 2022-11-22 MED ORDER — CYANOCOBALAMIN 1000 MCG/ML IJ SOLN
1000.0000 ug | Freq: Once | INTRAMUSCULAR | Status: AC
  Administered 2022-11-22: 1000 ug via INTRAMUSCULAR

## 2022-11-22 NOTE — Progress Notes (Signed)
Pt presented for their vitamin B12 injection. Pt was identified through two identifiers. Pt tolerated shot well in their right deltoid.  

## 2022-12-01 ENCOUNTER — Ambulatory Visit
Admission: RE | Admit: 2022-12-01 | Discharge: 2022-12-01 | Disposition: A | Discharge status: Home / Self Care | Payer: Medicare Other | Source: Ambulatory Visit | Attending: Family | Admitting: Family

## 2022-12-01 DIAGNOSIS — Z1231 Encounter for screening mammogram for malignant neoplasm of breast: Secondary | ICD-10-CM | POA: Insufficient documentation

## 2022-12-06 DIAGNOSIS — H2511 Age-related nuclear cataract, right eye: Secondary | ICD-10-CM | POA: Diagnosis not present

## 2022-12-23 ENCOUNTER — Ambulatory Visit (INDEPENDENT_AMBULATORY_CARE_PROVIDER_SITE_OTHER): Payer: Medicare Other

## 2022-12-23 VITALS — Wt 185.0 lb

## 2022-12-23 DIAGNOSIS — E538 Deficiency of other specified B group vitamins: Secondary | ICD-10-CM | POA: Diagnosis not present

## 2022-12-23 MED ORDER — CYANOCOBALAMIN 1000 MCG/ML IJ SOLN
1000.0000 ug | Freq: Once | INTRAMUSCULAR | Status: AC
  Administered 2022-12-23: 1000 ug via INTRAMUSCULAR

## 2022-12-23 NOTE — Progress Notes (Signed)
Patient was administered a B12 injection in her Left Arm. Patient tolerated well. Patient did not show signs of distress or voice any concerns.

## 2023-01-04 ENCOUNTER — Telehealth: Payer: Self-pay | Admitting: Family

## 2023-01-04 NOTE — Telephone Encounter (Signed)
Pt called stating she woke up dizzy.  No other symptoms Sent to access nurse

## 2023-01-04 NOTE — Telephone Encounter (Signed)
Access nurse note.  Appointment scheduled with Mable Paris, NP 01/05/23  [place] [facility] [place] RECORD AccessNurse Patient Name: Krista Gentry Gender: Female DOB: 02/05/1946 Age: 77 Y 1 M 24 D Return Phone Number: [phone] (Primary), [phone] (Secondary) Address: City/ State/ Zip: [place] [address] Client [place] [facility] [place] Client Site [place] - Day Provider Mable Paris- NP Contact Type Call Who Is Calling Patient / Member / Family / Caregiver Call Type Triage / Clinical Relationship To Patient Self Return Phone Number [phone] (Primary) Chief Complaint Dizziness Reason for Call Symptomatic / Request for [place] says she woke up dizzy. Translation No Nurse Assessment Nurse: Self, RN, Nira Conn Date/Time (Eastern Time): 01/04/2023 8:48:35 AM Confirm and document reason for call. If symptomatic, describe symptoms. ---Caller says she woke up dizzy yesterday and then last night dizzy when up, laying down helps. Room spinning type dizziness. Does the patient have any new or worsening symptoms? ---Yes Will a triage be completed? ---Yes Related visit to physician within the last 2 weeks? ---No Does the PT have any chronic conditions? (i.e. diabetes, asthma, this includes High risk factors for pregnancy, etc.) ---Yes List chronic conditions. ---Iron deficiency Is this a behavioral health or substance abuse call? ---No Guidelines Guideline Title Affirmed Question Affirmed Notes Nurse Date/Time (Eastern Time) Dizziness - Vertigo [1] MODERATE dizziness (e.g., vertigo; feels very unsteady, interferes with normal activities) AND [2] has NOT been evaluated by Self, RN, Nira Conn 01/04/2023 8:50:41 AM PLEASE NOTE: All timestamps contained within this report are represented as [place] Standard Time. CONFIDENTIALTY  NOTICE: This fax transmission is intended only for the addressee. It contains information that is legally privileged, confidential or otherwise protected from use or disclosure. If you are not the intended recipient, you are strictly prohibited from reviewing, disclosing, copying using or disseminating any of this information or taking any action in reliance on or regarding this information. If you have received this fax in error, please notify us immediately by telephone so that we can arrange for its return to [place]. Phone: [phone], Toll-Free: [phone], Fax: [phone] Page: 2 of 2 Call Id: [id number] Guidelines Guideline Title Affirmed Question Affirmed Notes Nurse Date/Time Eilene Ghazi Time) doctor (or NP/PA) for this Disp. Time Eilene Ghazi Time) Disposition Final User 01/04/2023 8:55:35 AM See PCP within 24 Hours Yes Self, RN, Nira Conn Final Disposition 01/04/2023 8:55:35 AM See PCP within 24 Hours Yes Self, RN, Soyla Murphy Disagree/Comply Comply Caller Understands Yes PreDisposition Call Doctor Care Advice Given Per Guideline SEE PCP WITHIN 24 HOURS: * IF OFFICE WILL BE OPEN: You need to be examined within the next 24 hours. Call your doctor (or NP/PA) when the office opens and make an appointment. CALL BACK IF: * Severe headache occurs * Weakness develops in an arm or leg * Unable to walk without falling * You become worse CARE ADVICE given per Dizziness - Vertigo (Adult) guideline. Comments User: Robynn Pane, RN Date/Time Eilene Ghazi Time): 01/04/2023 8:54:20 AM Caller says numbness in left hand off and on but this has been going on and thinks OA , long on going issue. Referrals REFERRED TO PCP OFFICE

## 2023-01-04 NOTE — Telephone Encounter (Signed)
NOTED

## 2023-01-05 ENCOUNTER — Other Ambulatory Visit: Payer: Self-pay | Admitting: Family

## 2023-01-05 ENCOUNTER — Ambulatory Visit (INDEPENDENT_AMBULATORY_CARE_PROVIDER_SITE_OTHER): Payer: Medicare Other | Admitting: Family

## 2023-01-05 ENCOUNTER — Encounter: Payer: Self-pay | Admitting: Family

## 2023-01-05 ENCOUNTER — Ambulatory Visit
Admission: RE | Admit: 2023-01-05 | Discharge: 2023-01-05 | Disposition: A | Discharge status: Home / Self Care | Payer: Medicare Other | Source: Ambulatory Visit | Attending: Family | Admitting: Family

## 2023-01-05 VITALS — BP 140/78 | HR 86 | Temp 98.7°F | Ht 64.0 in | Wt 188.6 lb

## 2023-01-05 DIAGNOSIS — Z13220 Encounter for screening for lipoid disorders: Secondary | ICD-10-CM

## 2023-01-05 DIAGNOSIS — Z136 Encounter for screening for cardiovascular disorders: Secondary | ICD-10-CM

## 2023-01-05 DIAGNOSIS — R42 Dizziness and giddiness: Secondary | ICD-10-CM

## 2023-01-05 DIAGNOSIS — I6523 Occlusion and stenosis of bilateral carotid arteries: Secondary | ICD-10-CM | POA: Diagnosis not present

## 2023-01-05 LAB — COMPREHENSIVE METABOLIC PANEL
ALT: 14 U/L (ref 0–35)
AST: 16 U/L (ref 0–37)
Albumin: 4.3 g/dL (ref 3.5–5.2)
Alkaline Phosphatase: 64 U/L (ref 39–117)
BUN: 20 mg/dL (ref 6–23)
CO2: 31 mEq/L (ref 19–32)
Calcium: 9.5 mg/dL (ref 8.4–10.5)
Chloride: 102 mEq/L (ref 96–112)
Creatinine, Ser: 0.93 mg/dL (ref 0.40–1.20)
GFR: 59.85 mL/min — ABNORMAL LOW (ref >60.00)
Glucose, Bld: 94 mg/dL (ref 70–99)
Potassium: 4.4 mEq/L (ref 3.5–5.1)
Sodium: 139 mEq/L (ref 135–145)
Total Bilirubin: 0.5 mg/dL (ref 0.2–1.2)
Total Protein: 6.9 g/dL (ref 6.0–8.3)

## 2023-01-05 LAB — IBC + FERRITIN
Ferritin: 83.9 ng/mL (ref 10.0–291.0)
Iron: 62 ug/dL (ref 42–145)
Saturation Ratios: 17.4 % — ABNORMAL LOW (ref 20.0–50.0)
TIBC: 357 ug/dL (ref 250.0–450.0)
Transferrin: 255 mg/dL (ref 212.0–360.0)

## 2023-01-05 LAB — CBC WITH DIFFERENTIAL/PLATELET
Basophils Absolute: 0.1 10*3/uL (ref 0.0–0.1)
Basophils Relative: 0.8 % (ref 0.0–3.0)
Eosinophils Absolute: 0.1 10*3/uL (ref 0.0–0.7)
Eosinophils Relative: 1.2 % (ref 0.0–5.0)
HCT: 42.3 % (ref 36.0–46.0)
Hemoglobin: 14 g/dL (ref 12.0–15.0)
Lymphocytes Relative: 20.8 % (ref 12.0–46.0)
Lymphs Abs: 1.4 10*3/uL (ref 0.7–4.0)
MCHC: 33.2 g/dL (ref 30.0–36.0)
MCV: 86.9 fl (ref 78.0–100.0)
Monocytes Absolute: 0.4 10*3/uL (ref 0.1–1.0)
Monocytes Relative: 6.5 % (ref 3.0–12.0)
Neutro Abs: 4.7 10*3/uL (ref 1.4–7.7)
Neutrophils Relative %: 70.7 % (ref 43.0–77.0)
Platelets: 189 10*3/uL (ref 150.0–400.0)
RBC: 4.87 Mil/uL (ref 3.87–5.11)
RDW: 15 % (ref 11.5–15.5)
WBC: 6.6 10*3/uL (ref 4.0–10.5)

## 2023-01-05 LAB — LIPID PANEL
Cholesterol: 169 mg/dL (ref 0–200)
HDL: 70.8 mg/dL (ref >39.00)
LDL Cholesterol: 84 mg/dL (ref 0–99)
NonHDL: 97.95
Total CHOL/HDL Ratio: 2
Triglycerides: 68 mg/dL (ref 0.0–149.0)
VLDL: 13.6 mg/dL (ref 0.0–40.0)

## 2023-01-05 MED ORDER — MECLIZINE HCL 12.5 MG PO TABS: 12.5000 mg | ORAL_TABLET | Freq: Three times a day (TID) | ORAL | 0 refills | Status: AC | PRN

## 2023-01-05 NOTE — Progress Notes (Signed)
Assessment & Plan:  Dizziness Assessment & Plan: Patient well-appearing in room today.  Reassuring neurologic exam . nontoxic in appearance.  She is able to articulate when she stands from her chair that she does feel a faint sensation of dizziness.  No syncopal episodes.  EKG shows normal sinus rhythm with occasional ectopic ventricular beat.  No significant changes from previous EKG obtained 10/16/2020.  Differential includes dehydration, benign positional vertigo, eustachian tube dysfunction, CVA.  Most concerned for CVA based on her age and acute presentation.  I ordered MRI of the brain stat.  Pending ultrasound carotid arteries; noted been normal in 2018.  pending lab evaluation.  Will start Flonase, meclizine.  Close follow-up  Orders: -     CBC with Differential/Platelet -     IBC + Ferritin -     Comprehensive metabolic panel -     EKG [id number] -     MR BRAIN W WO CONTRAST; Future -     US Carotid Bilateral; Future  Encounter for lipid screening for cardiovascular disease -     Lipid panel     Return precautions given.   Risks, benefits, and alternatives of the medications and treatment plan prescribed today were discussed, and patient expressed understanding.   Education regarding symptom management and diagnosis given to patient on AVS either electronically or printed.  No follow-ups on file.  Mable Paris, FNP  Subjective:    Patient ID: Krista Gentry, female    DOB: [birth year], 77 y.o.   MRN: [id number]  CC: Krista Gentry is a 77 y.o. female who presents today for an acute visit.    HPI: Complains of episodes of dizziness x 3 days, comes and goes.  Dizziness started 3 days ago in the morning when she was getting out bed. Describes yesterday while sitting on the commode , the magazines and room were spinning. When she moves her head , she feels dizzy.   Yesterday had slight HA around eyes. Nose has been 'running' for months. She is using claritin and zrytec  once daily.   She started taking OTC iron as thought iron may be low.  She started dramamine with some improvement.   No associated palpitations, chest pain, syncope, headache, vision changes, hearing loss, tinnitus, v , n.   No falls.   She has lost 50lbs with weight watchers. She hasn't excercised as much this past month after cataract surgery. Previously she had been riding exercise bike.   She had cataract surgery 12/06/22.       Allergies: Patient has no known allergies. Current Outpatient Medications on File Prior to Visit  Medication Sig Dispense Refill   acetaminophen (TYLENOL) 325 MG tablet Take 1-2 tablets (325-650 mg total) by mouth every 6 (six) hours as needed. 60 tablet 1   ACETYLCARNITINE PO Take 1,500 mg by mouth.     Barberry-Oreg Grape-Goldenseal (BERBERINE COMPLEX PO) Take 500 mcg by mouth daily.     cetirizine (ZYRTEC) 10 MG tablet Take 10 mg by mouth daily as needed for allergies.      Cholecalciferol (VITAMIN D) 50 MCG (2000 UT) tablet Take 6,000 Units by mouth daily.     CRANBERRY EXTRACT PO Take 4,200 mg by mouth daily.      Menaquinone-7 (VITAMIN K2 PO) Take 600 mcg by mouth daily.     mometasone (ELOCON) 0.1 % cream Apply 1 application topically daily. Appear pea sized ( or less) amount to external ear, and slightly in canal. 15 g  1   Multiple Minerals-Vitamins (CITRACAL MAXIMUM PLUS) TABS Take 2 tablets by mouth daily.      Multiple Vitamins-Minerals (IMMUNE SUPPORT PO) Take 2 tablets by mouth daily.     mupirocin ointment (BACTROBAN) 2 % Apply 1 Application topically daily. 22 g 0   OVER THE COUNTER MEDICATION Take 1 tablet by mouth daily. Nutratherm otc supplement     Quercetin 250 MG TABS Take 250 mg by mouth daily.      TURMERIC-GINGER PO Take by mouth.     UNABLE TO FIND cbd 20 mg     Zinc 50 MG CAPS Take 50 mg by mouth daily.      No current facility-administered medications on file prior to visit.    Review of Systems  Constitutional:   Negative for chills and fever.  HENT:  Positive for congestion.   Eyes:  Negative for visual disturbance.  Respiratory:  Negative for cough.   Cardiovascular:  Negative for chest pain and palpitations.  Gastrointestinal:  Negative for nausea and vomiting.  Neurological:  Positive for dizziness. Negative for syncope and headaches.      Objective:    BP (!) 140/78   Pulse 86   Temp 98.7 F (37.1 C) (Oral)   Ht '5\' 4"'$  (1.626 m)   Wt 188 lb 9.6 oz (85.5 kg)   SpO2 96%   BMI 32.37 kg/m   BP Readings from Last 3 Encounters:  01/05/23 (!) 140/78  05/14/22 124/68  05/11/22 128/78   Wt Readings from Last 3 Encounters:  01/05/23 188 lb 9.6 oz (85.5 kg)  12/23/22 185 lb (83.9 kg)  08/17/22 202 lb (91.6 kg)    Physical Exam Vitals reviewed.  Constitutional:      Appearance: She is well-developed.  HENT:     Head: Normocephalic and atraumatic.     Right Ear: Hearing, tympanic membrane, ear canal and external ear normal. No swelling or tenderness. No middle ear effusion. Tympanic membrane is not erythematous or bulging.     Left Ear: Tympanic membrane, ear canal and external ear normal. No swelling or tenderness.  No middle ear effusion. Tympanic membrane is not erythematous or bulging.     Nose: Nose normal. No rhinorrhea.     Right Sinus: No maxillary sinus tenderness or frontal sinus tenderness.     Left Sinus: No maxillary sinus tenderness or frontal sinus tenderness.     Mouth/Throat:     Pharynx: Uvula midline. No posterior oropharyngeal erythema.  Eyes:     General: Lids are normal. Lids are everted, no foreign bodies appreciated.     Conjunctiva/sclera: Conjunctivae normal.     Pupils: Pupils are equal, round, and reactive to light.     Comments: Normal fundus bilaterally   Neck:     Vascular: No carotid bruit.  Cardiovascular:     Rate and Rhythm: Normal rate and regular rhythm.     Pulses: Normal pulses.     Heart sounds: Normal heart sounds.  Pulmonary:      Effort: Pulmonary effort is normal.     Breath sounds: Normal breath sounds. No wheezing, rhonchi or rales.  Lymphadenopathy:     Head:     Right side of head: No submental, submandibular, tonsillar, preauricular, posterior auricular or occipital adenopathy.     Left side of head: No submental, submandibular, tonsillar, preauricular, posterior auricular or occipital adenopathy.     Cervical: No cervical adenopathy.     Right cervical: No superficial, deep or posterior cervical  adenopathy.    Left cervical: No superficial, deep or posterior cervical adenopathy.  Skin:    General: Skin is warm and dry.  Neurological:     Mental Status: She is alert.     Cranial Nerves: No cranial nerve deficit.     Sensory: No sensory deficit.     Deep Tendon Reflexes:     Reflex Scores:      Bicep reflexes are 2+ on the right side and 2+ on the left side.      Patellar reflexes are 2+ on the right side and 2+ on the left side.    Comments: Grip equal and strong bilateral upper extremities. Gait strong and steady. Able to perform  finger-to-nose without difficulty.  Dix hall pike maneuver did not elicit vertigo. No nystagmus noted.    Psychiatric:        Speech: Speech normal.        Behavior: Behavior normal.        Thought Content: Thought content normal.

## 2023-01-05 NOTE — Assessment & Plan Note (Addendum)
Patient well-appearing in room today.  Reassuring neurologic exam . nontoxic in appearance.  She is able to articulate when she stands from her chair that she does feel a faint sensation of dizziness.  No syncopal episodes.  EKG shows normal sinus rhythm with occasional ectopic ventricular beat.  No significant changes from previous EKG obtained 10/16/2020.  Differential includes dehydration, benign positional vertigo, eustachian tube dysfunction, CVA.  Most concerned for CVA based on her age and acute presentation.  I ordered MRI of the brain stat.  Pending ultrasound carotid arteries; noted been normal in 2018.  pending lab evaluation.  Will start Flonase, meclizine.  Close follow-up

## 2023-01-05 NOTE — Patient Instructions (Addendum)
Overall exam today is reassuring.    We ordered an MRI of the brain stat.  This will rule out stroke   I do question if dizziness is related to level of hydration.   Please make sure that you are drinking plenty of water and your urine is pale in color.  I also question of congestion and that is contributing to vertigo like symptoms.  Start flonase which is over-the-counter and a steroid.  I have sent in a different antihistamine than Dramamine called meclizine prescription.  Please trial this for symptoms.  I would like to stay in close touch as you approach her trip on Friday.  If symptoms worsen or not improving, please let me know.    I question  Benign paroxysmal positional vertigo (BPPV) and have included information from [facility] below.   You may look videos for Epley's maneuvers as we discussed online.   If dizziness/vertigo persists, a referral to ENT for further evaluation and medication may be appropriate.   If there is no improvement in your symptoms, or if there is any worsening of symptoms, or if you have any additional concerns, please return to this clinic for re-evaluation; or, if we are closed, consider going to the Emergency Room for evaluation.   What is BPPV?  BPPV  is one of the most common causes of vertigo -- the sudden sensation that you're spinning or that the inside of your head is spinning. Benign paroxysmal positional vertigo causes brief episodes of mild to intense dizziness. Benign paroxysmal positional vertigo is usually triggered by specific changes in the position of your head. This might occur when you tip your head up or down, when you lie down, or when you turn over or sit up in bed. Although benign paroxysmal positional vertigo can be a bothersome problem, it's rarely serious except when it increases the chance of falls.  If you experience dizziness associated with benign paroxysmal positional vertigo (BPPV), consider these tips: Be aware of the  possibility of losing your balance, which can lead to falling and serious injury.  Sit down immediately when you feel dizzy.  Use good lighting if you get up at night.  Walk with a cane for stability if you're at risk of falling.  Work closely with your doctor to manage your symptoms effectively. BPPV may recur even after successful therapy. Fortunately, although there's no cure, the condition can be managed with physical therapy and home treatments.   Dizziness [Uncertain Cause] Dizziness is a common symptom sometimes described as "lightheadedness" or feeling like you are going to faint. If it lasts for only a few seconds and is related to changes in position (such as getting up after lying or sitting for a long time), it is usually not a sign of anything serious. Dizziness that lasts for minutes to hours, or comes on for no apparent reason, may be a sign of a more serious problem (such as dehydration, a medicine reaction, disease of the heart or brain). Today's exam did not show an exact cause for your dizzy spell . Sometimes additional tests are required before a cause can be found. Therefore, it is important to follow up with your doctor if your symptoms continue. Home Care: 1) If a dizzy spell occurs and lasts more than a few seconds, lie down until it passes. If you are lying down, then you cannot hurt yourself by falling if you do faint. 2) Do not drive or operate dangerous equipment until the dizzy  spells have stopped for at least 48 hours. 3) If dizzy spells occur with sudden standing, this may be a sign of mild dehydration. Drink extra fluids over the next few days. 4) If you recently started a new medicine or if you had the dose of a current medicine increased (especially blood pressure medicine), talk with the prescribing doctor about your symptoms. Dose adjustments may be needed. Follow Up with your doctor for further evaluation within the next seven days, if your symptoms  continue. Get Prompt Medical Attention if any of the following occur: -- Worsening of your symptoms -- Fainting, headache or seizure -- Repeated vomiting -- Feeling like you or the room is spinning -- Chest, arm, neck, back or jaw pain -- Palpitations (the sense that your heart is fluttering or beating fast or hard) -- Shortness of breath -- Blood in vomit or stool (black or red color) -- Weakness of an arm or leg or one side of the face -- Difficulty with speech or vision  2000-2015 [employer] [address] [place] [address]. All rights reserved. This information is not intended as a substitute for professional medical care. Always follow your healthcare professional's instructions.

## 2023-01-14 DIAGNOSIS — H52223 Regular astigmatism, bilateral: Secondary | ICD-10-CM | POA: Diagnosis not present

## 2023-01-14 DIAGNOSIS — H524 Presbyopia: Secondary | ICD-10-CM | POA: Diagnosis not present

## 2023-01-25 ENCOUNTER — Ambulatory Visit (INDEPENDENT_AMBULATORY_CARE_PROVIDER_SITE_OTHER): Payer: Medicare Other

## 2023-01-25 DIAGNOSIS — E538 Deficiency of other specified B group vitamins: Secondary | ICD-10-CM | POA: Diagnosis not present

## 2023-01-25 MED ORDER — CYANOCOBALAMIN 1000 MCG/ML IJ SOLN
1000.0000 ug | Freq: Once | INTRAMUSCULAR | Status: AC
  Administered 2023-01-25: 1000 ug via INTRAMUSCULAR

## 2023-01-25 NOTE — Progress Notes (Signed)
Patient presented for B 12 injection to left deltoid, patient voiced no concerns nor showed any signs of distress during injection. 

## 2023-02-24 ENCOUNTER — Ambulatory Visit (INDEPENDENT_AMBULATORY_CARE_PROVIDER_SITE_OTHER): Payer: Medicare Other

## 2023-02-24 DIAGNOSIS — E538 Deficiency of other specified B group vitamins: Secondary | ICD-10-CM

## 2023-02-24 MED ORDER — CYANOCOBALAMIN 1000 MCG/ML IJ SOLN
1000.0000 ug | Freq: Once | INTRAMUSCULAR | Status: AC
  Administered 2023-02-24: 1000 ug via INTRAMUSCULAR

## 2023-02-24 NOTE — Progress Notes (Signed)
Patient arrived for her B12 injection. Patient was administered a B12 injection into her right deltoid. Patient tolerated the injection well and did not show any signs of distress or voice any concerns.

## 2023-03-29 ENCOUNTER — Ambulatory Visit: Payer: Medicare Other

## 2023-03-31 ENCOUNTER — Ambulatory Visit (INDEPENDENT_AMBULATORY_CARE_PROVIDER_SITE_OTHER): Payer: Medicare Other

## 2023-03-31 DIAGNOSIS — E538 Deficiency of other specified B group vitamins: Secondary | ICD-10-CM | POA: Diagnosis not present

## 2023-03-31 MED ORDER — CYANOCOBALAMIN 1000 MCG/ML IJ SOLN
1000.0000 ug | Freq: Once | INTRAMUSCULAR | Status: AC
  Administered 2023-03-31: 1000 ug via INTRAMUSCULAR

## 2023-03-31 NOTE — Progress Notes (Signed)
Patient presented for B 12 injection to left deltoid, patient voiced no concerns nor showed any signs of distress during injection. 

## 2023-04-25 ENCOUNTER — Telehealth: Payer: Self-pay | Admitting: Family

## 2023-04-25 NOTE — Telephone Encounter (Signed)
Contacted Krista Gentry to schedule their annual wellness visit. Call back at later date: REQ CB 04/26/23  Krista Gentry; Care Guide Ambulatory Clinical Support [place] [facility] Direct Dial: [phone]

## 2023-04-26 ENCOUNTER — Telehealth: Payer: Self-pay | Admitting: Family

## 2023-04-26 NOTE — Telephone Encounter (Signed)
Contacted [name] to schedule their annual wellness visit. Appointment made for 05/10/2023.  Verlee Rossetti; Care Guide Ambulatory Clinical Support [place] [facility] Direct Dial: [phone]

## 2023-04-26 NOTE — Telephone Encounter (Signed)
Copied from CRM [id number]. Topic: Medicare AWV >> Apr 26, 2023 11:48 AM Payton Doughty wrote: Reason for CRM: Called patient to schedule Medicare Annual Wellness Visit (AWV). Left message for patient to call back and schedule Medicare Annual Wellness Visit (AWV).  Last date of AWV: 04/22/22  Please schedule an appointment at any time with Annabell Sabal, CMA  .  If any questions, please contact me.  Thank you ,  Verlee Rossetti; Care Guide Ambulatory Clinical Support [place] [facility] Direct Dial: [phone]

## 2023-05-04 ENCOUNTER — Telehealth (INDEPENDENT_AMBULATORY_CARE_PROVIDER_SITE_OTHER): Payer: Medicare Other | Admitting: Family

## 2023-05-04 ENCOUNTER — Encounter: Payer: Self-pay | Admitting: Family

## 2023-05-04 VITALS — Ht 64.0 in | Wt 188.2 lb

## 2023-05-04 DIAGNOSIS — R42 Dizziness and giddiness: Secondary | ICD-10-CM

## 2023-05-04 NOTE — Assessment & Plan Note (Signed)
Fortunately symptoms have completely resolved without recurrence.  Will follow

## 2023-05-04 NOTE — Progress Notes (Signed)
Virtual Visit via Video Note  I connected with Krista Gentry on 05/04/23 at  4:00 PM EDT by a video enabled telemedicine application and verified that I am speaking with the correct person using two identifiers. Location patient: home Location provider: work  Persons participating in the virtual visit: patient, provider  I discussed the limitations of evaluation and management by telemedicine and the availability of in person appointments. The patient expressed understanding and agreed to proceed.  HPI: Feels well today.  No new complaints   no recurrence of dizziness.  She has not had to take meclizine.    ROS: See pertinent positives and negatives per HPI.  EXAM:  VITALS per patient if applicable: Ht 5\' 4"  (1.626 m)   Wt 188 lb 3.2 oz (85.4 kg)   BMI 32.30 kg/m  BP Readings from Last 3 Encounters:  01/05/23 (!) 140/78  05/14/22 124/68  05/11/22 128/78   Wt Readings from Last 3 Encounters:  05/04/23 188 lb 3.2 oz (85.4 kg)  01/05/23 188 lb 9.6 oz (85.5 kg)  12/23/22 185 lb (83.9 kg)    GENERAL: alert, oriented, appears well and in no acute distress  HEENT: atraumatic, conjunttiva clear, no obvious abnormalities on inspection of external nose and ears  NECK: normal movements of the head and neck  LUNGS: on inspection no signs of respiratory distress, breathing rate appears normal, no obvious gross SOB, gasping or wheezing  CV: no obvious cyanosis  MS: moves all visible extremities without noticeable abnormality  PSYCH/NEURO: pleasant and cooperative, no obvious depression or anxiety, speech and thought processing grossly intact  ASSESSMENT AND PLAN: Dizziness Assessment & Plan: Fortunately symptoms have completely resolved without recurrence.  Will follow      -we discussed possible serious and likely etiologies, options for evaluation and workup, limitations of telemedicine visit vs in person visit, treatment, treatment risks and precautions. Pt prefers to  treat via telemedicine empirically rather then risking or undertaking an in person visit at this moment.    I discussed the assessment and treatment plan with the patient. The patient was provided an opportunity to ask questions and all were answered. The patient agreed with the plan and demonstrated an understanding of the instructions.   The patient was advised to call back or seek an in-person evaluation if the symptoms worsen or if the condition fails to improve as anticipated.  Advised if desired AVS can be mailed or viewed via MyChart if Mychart user.   Rennie Plowman, FNP

## 2023-05-05 ENCOUNTER — Telehealth: Payer: Self-pay | Admitting: Family

## 2023-05-05 ENCOUNTER — Ambulatory Visit (INDEPENDENT_AMBULATORY_CARE_PROVIDER_SITE_OTHER): Payer: Medicare Other

## 2023-05-05 DIAGNOSIS — E538 Deficiency of other specified B group vitamins: Secondary | ICD-10-CM | POA: Diagnosis not present

## 2023-05-05 MED ORDER — CYANOCOBALAMIN 1000 MCG/ML IJ SOLN
1000.0000 ug | Freq: Once | INTRAMUSCULAR | Status: AC
  Administered 2023-05-05: 1000 ug via INTRAMUSCULAR

## 2023-05-05 NOTE — Telephone Encounter (Signed)
Called pt to scheduled her yearly f/u. As per pt, she will call another time to make that appt.

## 2023-05-05 NOTE — Progress Notes (Signed)
Patient arrived for a B12 injection and it was administered into her Right deltoid. Patient tolerated the injection well and did not show any signs of distress or voice any concerns. 

## 2023-05-11 ENCOUNTER — Ambulatory Visit (INDEPENDENT_AMBULATORY_CARE_PROVIDER_SITE_OTHER): Payer: Medicare Other

## 2023-05-11 DIAGNOSIS — Z0000 Encounter for general adult medical examination without abnormal findings: Secondary | ICD-10-CM | POA: Diagnosis not present

## 2023-05-11 NOTE — Patient Instructions (Signed)

## 2023-05-11 NOTE — Progress Notes (Signed)
I connected with  Krista Gentry on 05/11/23 by a audio enabled telemedicine application and verified that I am speaking with the correct person using two identifiers.  Patient Location: Home  Provider Location: Home Office  I discussed the limitations of evaluation and management by telemedicine. The patient expressed understanding and agreed to proceed.   Subjective:   Krista Gentry is a 77 y.o. female who presents for Medicare Annual (Subsequent) preventive examination.  Review of Systems    Per HPI unless specifically indicated below.  Cardiac Risk Factors include: advanced age (>58men, >53 women);female gender,Hypertension.          Objective:       05/04/2023    3:57 PM 01/05/2023    9:10 AM 01/05/2023    9:09 AM  Vitals with BMI  Height 5\' 4"   5\' 4"   Weight 188 lbs 3 oz  188 lbs 10 oz  BMI 32.29  32.36  Systolic  140 142  Diastolic  78 84  Pulse  86 76    There were no vitals filed for this visit. There is no height or weight on file to calculate BMI.     05/11/2023   10:40 AM 04/22/2022    9:09 AM 10/23/2020    8:13 AM 10/15/2020    9:34 AM 10/03/2020    8:41 AM 10/03/2019    8:46 AM 09/27/2018    4:41 PM  Advanced Directives  Does Patient Have a Medical Advance Directive? No No Yes Yes Yes Yes Yes  Type of Advance Directive   Living will Living will Healthcare Power of [place];Living will Healthcare Power of [place];Living will Healthcare Power of Attorney  Does patient want to make changes to medical advance directive? No - Patient declined  No - Patient declined  No - Patient declined No - Patient declined No - Patient declined  Copy of Healthcare Power of Attorney in Chart?   No - copy requested  No - copy requested No - copy requested No - copy requested  Would patient like information on creating a medical advance directive?  No - Patient declined         Current Medications (verified) Outpatient Encounter Medications as of 05/11/2023  Medication  Sig   ACETYLCARNITINE PO Take 1,500 mg by mouth.   Barberry-Oreg Grape-Goldenseal (BERBERINE COMPLEX PO) Take 500 mcg by mouth daily.   cetirizine (ZYRTEC) 10 MG tablet Take 10 mg by mouth daily as needed for allergies.    Cholecalciferol (VITAMIN D) 50 MCG (2000 UT) tablet Take 6,000 Units by mouth daily.   CRANBERRY EXTRACT PO Take 4,200 mg by mouth daily.    meclizine (ANTIVERT) 12.5 MG tablet Take 1 tablet (12.5 mg total) by mouth 3 (three) times daily as needed for dizziness.   Menaquinone-7 (VITAMIN K2 PO) Take 600 mcg by mouth daily.   mometasone (ELOCON) 0.1 % cream Apply 1 application topically daily. Appear pea sized ( or less) amount to external ear, and slightly in canal.   Multiple Minerals-Vitamins (CITRACAL MAXIMUM PLUS) TABS Take 2 tablets by mouth daily.    Multiple Vitamins-Minerals (IMMUNE SUPPORT PO) Take 2 tablets by mouth daily.   mupirocin ointment (BACTROBAN) 2 % Apply 1 Application topically daily.   OVER THE COUNTER MEDICATION Take 1 tablet by mouth daily. Nutratherm otc supplement   Quercetin 250 MG TABS Take 250 mg by mouth daily.    TURMERIC-GINGER PO Take by mouth.   UNABLE TO FIND cbd 20 mg  Zinc 50 MG CAPS Take 50 mg by mouth daily.    [DISCONTINUED] acetaminophen (TYLENOL) 325 MG tablet Take 1-2 tablets (325-650 mg total) by mouth every 6 (six) hours as needed. (Patient not taking: Reported on 05/11/2023)   No facility-administered encounter medications on file as of 05/11/2023.    Allergies (verified) Patient has no known allergies.   History: Past Medical History:  Diagnosis Date   Allergy    Basal cell carcinoma 10/08/2020   right lateral calf   Dysplastic nevus 04/16/2021   right inferior buttock, moderate   Family history of adverse reaction to anesthesia    sister had difficult waking up from anesthesia . had pseudo cholinestrase   Past Surgical History:  Procedure Laterality Date   ABDOMINAL HYSTERECTOMY  1994   for noncancerous reason,  fibroids. She has bilateral oophorectomy.   BILATERAL OOPHORECTOMY     BREAST BIOPSY     CARPAL TUNNEL RELEASE Right 10/23/2020   Procedure: RIGHT CARPAL TUNNEL RELEASE and LEFT THUMB TRIGGER INJECTION;  Surgeon: Juanell Fairly, MD;  Location: [facility] ORS;  Service: Orthopedics;  Laterality: Right;   CHOLECYSTECTOMY  1994   Family History  Problem Relation Age of Onset   Kidney disease Mother        nephrolithiasis   Dementia Mother    Hypertension Mother    Kidney Stones Mother    Mental retardation Sister 8       possible autism,  now with depression   Dementia Sister    Cancer Maternal Aunt        breast cancer   Dementia Maternal Aunt    Mental retardation Maternal Aunt    Breast cancer Maternal Aunt    Dementia Maternal Uncle    Cancer Maternal Grandmother        breast cancer   Breast cancer Maternal Grandmother    Heart attack Paternal Grandmother    Kidney Stones Brother    Cancer Maternal Grandfather        Stomach cancer   Colon cancer Neg Hx    Social History   Socioeconomic History   Marital status: Married    Spouse name: Not on file   Number of children: Not on file   Years of education: Not on file   Highest education level: Not on file  Occupational History   Occupation: Retired  Tobacco Use   Smoking status: Former    Packs/day: 1.00    Years: 5.00    Additional pack years: 0.00    Total pack years: 5.00    Types: Cigarettes    Quit date: 04/14/1970    Years since quitting: 53.1   Smokeless tobacco: Never  Vaping Use   Vaping Use: Never used  Substance and Sexual Activity   Alcohol use: Yes    Alcohol/week: 3.0 standard drinks of alcohol    Types: 2 Glasses of wine, 1 Cans of beer per week    Comment: occassion   Drug use: No   Sexual activity: Not on file  Other Topics Concern   Not on file  Social History Narrative   Not on file   Social Determinants of Health   Financial Resource Strain: Low Risk  (05/11/2023)   Overall Financial  Resource Strain (CARDIA)    Difficulty of Paying Living Expenses: Not hard at all  Food Insecurity: No Food Insecurity (05/11/2023)   Hunger Vital Sign    Worried About Running Out of Food in the Last Year: Never true  Ran Out of Food in the Last Year: Never true  Transportation Needs: No Transportation Needs (05/11/2023)   PRAPARE - Administrator, Civil Service (Medical): No    Lack of Transportation (Non-Medical): No  Physical Activity: Insufficiently Active (05/11/2023)   Exercise Vital Sign    Days of Exercise per Week: 3 days    Minutes of Exercise per Session: 30 min  Stress: No Stress Concern Present (05/11/2023)   [employer] of Occupational Health - Occupational Stress Questionnaire    Feeling of Stress : Not at all  Social Connections: Socially Integrated (05/11/2023)   Social Connection and Isolation Panel [NHANES]    Frequency of Communication with Friends and Family: More than three times a week    Frequency of Social Gatherings with Friends and Family: More than three times a week    Attends Religious Services: More than 4 times per year    Active Member of [employer] or Organizations: No    Attends Engineer, structural: More than 4 times per year    Marital Status: Married    Tobacco Counseling Counseling given: No   Clinical Intake:  Pre-visit preparation completed: No  Pain : No/denies pain     Nutritional Status: BMI 25 -29 Overweight Nutritional Risks: None Diabetes: No  How often do you need to have someone help you when you read instructions, pamphlets, or other written materials from your doctor or pharmacy?: 1 - Never  Diabetic?No  Interpreter Needed?: No  Information entered by :: Laurel Dimmer, CMA   Activities of Daily Living    05/07/2023   11:15 AM  In your present state of health, do you have any difficulty performing the following activities:  Hearing? 0  Vision? 0  Difficulty concentrating or making  decisions? 0  Walking or climbing stairs? 0  Dressing or bathing? 0  Doing errands, shopping? 0  Preparing Food and eating ? N  Using the Toilet? N  In the past six months, have you accidently leaked urine? Y  Do you have problems with loss of bowel control? N  Managing your Medications? N  Managing your Finances? N  Housekeeping or managing your Housekeeping? N    Patient Care Team: Allegra Grana, FNP as PCP - General (Family Medicine)  Indicate any recent Medical Services you may have received from other than [facility] providers in the past year (date may be approximate).     Assessment:   This is a routine wellness examination for [place].   Hearing/Vision screen Denies any hearing issues. Denies any change to her vision. Wear glasses. Annual Eye Exam.   Dietary issues and exercise activities discussed:     Goals Addressed   None    Depression Screen    05/04/2023    3:54 PM 01/05/2023    9:22 AM 04/22/2022    9:08 AM 11/10/2021   10:00 AM 10/03/2020    8:39 AM 10/03/2019    8:46 AM 09/27/2018    8:50 AM  PHQ 2/9 Scores  PHQ - 2 Score 0 0 0 0 0 0 0  PHQ- 9 Score  3         Fall Risk    05/07/2023   11:15 AM 05/04/2023    3:53 PM 01/05/2023    9:13 AM 04/22/2022    9:08 AM 11/10/2021   10:00 AM  Fall Risk   Falls in the past year? 1 0 0 0 0  Number falls in past yr:  0 0 0 0 0  Injury with Fall? 0 0 0  0  Risk for fall due to :  No Fall Risks No Fall Risks    Follow up  Falls evaluation completed Falls evaluation completed Falls evaluation completed     FALL RISK PREVENTION PERTAINING TO THE HOME:  Any stairs in or around the home? Yes  If so, are there any without handrails? No  Home free of loose throw rugs in walkways, pet beds, electrical cords, etc? Yes  Adequate lighting in your home to reduce risk of falls? Yes   ASSISTIVE DEVICES UTILIZED TO PREVENT FALLS:  Life alert? No  Use of a cane, walker or w/c? No  Grab bars in the bathroom? Yes   Shower chair or bench in shower? Yes  Elevated toilet seat or a handicapped toilet? No   TIMED UP AND GO:  Was the test performed? Unable to perform, virtual appointment   Cognitive Function: Patient is alert and oriented x3.  Denies difficulty focusing, making decisions, memory loss.  MMSE/6CIT deferred. Normal by direct communication/observation.      09/27/2018    8:53 AM  MMSE - Mini Mental State Exam  Orientation to time 5  Orientation to Place 5  Registration 3  Attention/ Calculation 5  Recall 3  Language- name 2 objects 2  Language- repeat 1  Language- follow 3 step command 3  Language- read & follow direction 1  Write a sentence 1  Copy design 1  Total score 30        10/03/2019    8:54 AM  6CIT Screen  What Year? 0 points  What month? 0 points  What time? 0 points  Count back from 20 0 points  Months in reverse 0 points  Repeat phrase 0 points  Total Score 0 points    Immunizations Immunization History  Administered Date(s) Administered   Influenza Split 10/05/2014   Influenza, High Dose Seasonal PF 10/23/2018   Influenza-Unspecified 09/06/2013   PPD Test 08/15/2013   Pneumococcal Conjugate-13 08/03/2017   Pneumococcal Polysaccharide-23 08/15/2013   Tdap 09/06/2013    TDAP status: Up to date  Flu Vaccine status: Up to date  Pneumococcal vaccine status: Up to date  Covid-19 vaccine status: Information provided on how to obtain vaccines.   Qualifies for Shingles Vaccine? Yes   Zostavax completed Yes   Shingrix Completed?: Yes  Screening Tests Health Maintenance  Topic Date Due   Zoster Vaccines- Shingrix (1 of 2) 08/11/2023 (Originally 11/11/1965)   INFLUENZA VACCINE  07/21/2023   DTaP/Tdap/Td (2 - Td or Tdap) 09/07/2023   Medicare Annual Wellness (AWV)  05/10/2024   Pneumonia Vaccine 1+ Years old  Completed   DEXA SCAN  Completed   Hepatitis C Screening  Completed   HPV VACCINES  Aged Out   COVID-19 Vaccine  Discontinued   Fecal  DNA (Cologuard)  Discontinued    Health Maintenance  There are no preventive care reminders to display for this patient.   Colorectal cancer screening: No longer required.   Mammogram status: No longer required due to age.  DEXA Scan: 01/02/2020  Lung Cancer Screening: (Low Dose CT Chest recommended if Age 29-80 years, 30 pack-year currently smoking OR have quit w/in 15years.) does not qualify.   Lung Cancer Screening Referral: not applicable   Additional Screening:  Hepatitis C Screening: does qualify; Completed 08/03/2017  Vision Screening: Recommended annual ophthalmology exams for early detection of glaucoma and other disorders of the eye. Is  the patient up to date with their annual eye exam?  Yes  Who is the provider or what is the name of the office in which the patient attends annual eye exams? [facility] If pt is not established with a provider, would they like to be referred to a provider to establish care? No .   Dental Screening: Recommended annual dental exams for proper oral hygiene  Community Resource Referral / Chronic Care Management: CRR required this visit?  No   CCM required this visit?  No      Plan:     I have personally reviewed and noted the following in the patient's chart:   Medical and social history Use of alcohol, tobacco or illicit drugs  Current medications and supplements including opioid prescriptions. Patient is not currently taking opioid prescriptions. Functional ability and status Nutritional status Physical activity Advanced directives List of other physicians Hospitalizations, surgeries, and ER visits in previous 12 months Vitals Screenings to include cognitive, depression, and falls Referrals and appointments  In addition, I have reviewed and discussed with patient certain preventive protocols, quality metrics, and best practice recommendations. A written personalized care plan for preventive services as well as general  preventive health recommendations were provided to patient.    Ms. Amrine , Thank you for taking time to come for your Medicare Wellness Visit. I appreciate your ongoing commitment to your health goals. Please review the following plan we discussed and let me know if I can assist you in the future.   These are the goals we discussed:  Goals       Maintain Healthy Lifestyle (pt-stated)      Stay active Healthy diet Stay hydrated          This is a list of the screening recommended for you and due dates:  Health Maintenance  Topic Date Due   Zoster (Shingles) Vaccine (1 of 2) 08/11/2023*   Flu Shot  07/21/2023   DTaP/Tdap/Td vaccine (2 - Td or Tdap) 09/07/2023   Medicare Annual Wellness Visit  05/10/2024   Pneumonia Vaccine  Completed   DEXA scan (bone density measurement)  Completed   Hepatitis C Screening: USPSTF Recommendation to screen - Ages 17-79 yo.  Completed   HPV Vaccine  Aged Out   COVID-19 Vaccine  Discontinued   Cologuard (Stool DNA test)  Discontinued  *Topic was postponed. The date shown is not the original due date.     Lonna Cobb, CMA   05/11/2023   Nurse Notes: Approximately 30 minute Non-Face -To-Face Medicare Wellness Visit

## 2023-06-09 ENCOUNTER — Ambulatory Visit (INDEPENDENT_AMBULATORY_CARE_PROVIDER_SITE_OTHER): Payer: Medicare Other | Admitting: *Deleted

## 2023-06-09 DIAGNOSIS — E538 Deficiency of other specified B group vitamins: Secondary | ICD-10-CM

## 2023-06-09 MED ORDER — CYANOCOBALAMIN 1000 MCG/ML IJ SOLN
1000.0000 ug | Freq: Once | INTRAMUSCULAR | Status: AC
  Administered 2023-06-09: 1000 ug via INTRAMUSCULAR

## 2023-06-09 NOTE — Progress Notes (Signed)
Pt received B12 injection in left arm. Pt tolerated it well with no complaints or concerns.

## 2023-07-07 DIAGNOSIS — K080 Exfoliation of teeth due to systemic causes: Secondary | ICD-10-CM | POA: Diagnosis not present

## 2023-07-11 DIAGNOSIS — K080 Exfoliation of teeth due to systemic causes: Secondary | ICD-10-CM | POA: Diagnosis not present

## 2023-07-14 ENCOUNTER — Ambulatory Visit: Payer: Medicare Other

## 2023-08-25 ENCOUNTER — Ambulatory Visit (INDEPENDENT_AMBULATORY_CARE_PROVIDER_SITE_OTHER): Payer: Medicare Other

## 2023-08-25 DIAGNOSIS — E538 Deficiency of other specified B group vitamins: Secondary | ICD-10-CM | POA: Diagnosis not present

## 2023-08-25 MED ORDER — CYANOCOBALAMIN 1000 MCG/ML IJ SOLN
1000.0000 ug | Freq: Once | INTRAMUSCULAR | Status: AC
  Administered 2023-08-25: 1000 ug via INTRAMUSCULAR

## 2023-08-25 NOTE — Progress Notes (Signed)
Patient presented for B 12 injection to right deltoid, patient voiced no concerns nor showed any signs of distress during injection. 

## 2023-09-15 DIAGNOSIS — K080 Exfoliation of teeth due to systemic causes: Secondary | ICD-10-CM | POA: Diagnosis not present

## 2023-09-22 DIAGNOSIS — M9903 Segmental and somatic dysfunction of lumbar region: Secondary | ICD-10-CM | POA: Diagnosis not present

## 2023-09-22 DIAGNOSIS — M5431 Sciatica, right side: Secondary | ICD-10-CM | POA: Diagnosis not present

## 2023-09-22 DIAGNOSIS — M9905 Segmental and somatic dysfunction of pelvic region: Secondary | ICD-10-CM | POA: Diagnosis not present

## 2023-09-22 DIAGNOSIS — M4306 Spondylolysis, lumbar region: Secondary | ICD-10-CM | POA: Diagnosis not present

## 2023-09-26 DIAGNOSIS — M9905 Segmental and somatic dysfunction of pelvic region: Secondary | ICD-10-CM | POA: Diagnosis not present

## 2023-09-26 DIAGNOSIS — M5431 Sciatica, right side: Secondary | ICD-10-CM | POA: Diagnosis not present

## 2023-09-26 DIAGNOSIS — M4306 Spondylolysis, lumbar region: Secondary | ICD-10-CM | POA: Diagnosis not present

## 2023-09-26 DIAGNOSIS — M9903 Segmental and somatic dysfunction of lumbar region: Secondary | ICD-10-CM | POA: Diagnosis not present

## 2023-09-28 DIAGNOSIS — M4306 Spondylolysis, lumbar region: Secondary | ICD-10-CM | POA: Diagnosis not present

## 2023-09-28 DIAGNOSIS — M9905 Segmental and somatic dysfunction of pelvic region: Secondary | ICD-10-CM | POA: Diagnosis not present

## 2023-09-28 DIAGNOSIS — M9903 Segmental and somatic dysfunction of lumbar region: Secondary | ICD-10-CM | POA: Diagnosis not present

## 2023-09-28 DIAGNOSIS — M5431 Sciatica, right side: Secondary | ICD-10-CM | POA: Diagnosis not present

## 2023-10-05 DIAGNOSIS — M4306 Spondylolysis, lumbar region: Secondary | ICD-10-CM | POA: Diagnosis not present

## 2023-10-05 DIAGNOSIS — M5431 Sciatica, right side: Secondary | ICD-10-CM | POA: Diagnosis not present

## 2023-10-05 DIAGNOSIS — M9905 Segmental and somatic dysfunction of pelvic region: Secondary | ICD-10-CM | POA: Diagnosis not present

## 2023-10-05 DIAGNOSIS — M9903 Segmental and somatic dysfunction of lumbar region: Secondary | ICD-10-CM | POA: Diagnosis not present

## 2023-10-08 ENCOUNTER — Telehealth: Payer: Medicare Other | Admitting: Family Medicine

## 2023-10-08 DIAGNOSIS — B029 Zoster without complications: Secondary | ICD-10-CM | POA: Diagnosis not present

## 2023-10-08 MED ORDER — VALACYCLOVIR HCL 1 G PO TABS: 1000.0000 mg | ORAL_TABLET | Freq: Three times a day (TID) | ORAL | 0 refills | Status: AC

## 2023-10-08 NOTE — Progress Notes (Signed)
I have spent 5 minutes in review of e-visit questionnaire, review and updating patient chart, medical decision making and response to patient.   Mia Milan Cody Jacklynn Dehaas, PA-C    

## 2023-10-08 NOTE — Progress Notes (Signed)
E-visit for Shingles   We are sorry that you are not feeling well. Here is how we plan to help!  Based on what you shared with me it looks like you have shingles.  Shingles or herpes zoster, is a common infection of the nerves.  It is a painful rash caused by the herpes zoster virus.  This is the same virus that causes chickenpox.  After a person has chickenpox, the virus remains inactive in the nerve cells.  Years later, the virus can become active again and travel to the skin.  It typically will appear on one side of the face or body.  Burning or shooting pain, tingling, or itching are early signs of the infection.  Blisters typically scab over in 7 to 10 days and clear up within 2-4 weeks. Shingles is only contagious to people that have never had the chickenpox, the chickenpox vaccine, or anyone who has a compromised immune system.  You should avoid contact with these type of people until your blisters scab over.  Since the rash if of newer onset and new patch noted today, I have prescribed Valacyclovir 1g three times daily for 7 days.  HOME CARE: Apply ice packs (wrapped in a thin towel), cool compresses, or soak in cool bath to help reduce pain. Use calamine lotion to calm itchy skin. Avoid scratching the rash. Avoid direct sunlight.  GET HELP RIGHT AWAY IF: Symptoms that don't away after treatment. A rash or blisters near your eye. Increased drainage, fever, or rash after treatment. Severe pain that doesn't go away.   MAKE SURE YOU   Understand these instructions. Will watch your condition. Will get help right away if you are not doing well or get worse.  Thank you for choosing an e-visit.  Your e-visit answers were reviewed by a board certified advanced clinical practitioner to complete your personal care plan. Depending upon the condition, your plan could have included both over the counter or prescription medications.  Please review your pharmacy choice. Make sure the  pharmacy is open so you can pick up prescription now. If there is a problem, you may contact your provider through [employer] and have the prescription routed to another pharmacy.  Your safety is important to [place]. If you have drug allergies check your prescription carefully.   For the next 24 hours you can use MyChart to ask questions about today's visit, request a non-urgent call back, or ask for a work or school excuse. You will get an email in the next two days asking about your experience. I hope that your e-visit has been valuable and will speed your recovery.

## 2023-10-10 ENCOUNTER — Ambulatory Visit: Payer: Medicare Other

## 2023-10-18 ENCOUNTER — Encounter: Payer: Medicare Other | Admitting: Dermatology

## 2023-10-19 ENCOUNTER — Ambulatory Visit: Payer: Medicare Other

## 2023-10-19 ENCOUNTER — Encounter: Payer: Medicare Other | Admitting: Dermatology

## 2023-10-19 DIAGNOSIS — E538 Deficiency of other specified B group vitamins: Secondary | ICD-10-CM

## 2023-10-19 MED ORDER — CYANOCOBALAMIN 1000 MCG/ML IJ SOLN
1000.0000 ug | Freq: Once | INTRAMUSCULAR | Status: AC
  Administered 2023-10-19: 1000 ug via INTRAMUSCULAR

## 2023-10-19 NOTE — Progress Notes (Signed)
Patient presented for B 12 injection to left deltoid, patient voiced no concerns nor showed any signs of distress during injection. 

## 2023-11-07 ENCOUNTER — Other Ambulatory Visit: Payer: Self-pay | Admitting: Family

## 2023-11-07 DIAGNOSIS — Z1231 Encounter for screening mammogram for malignant neoplasm of breast: Secondary | ICD-10-CM

## 2023-11-16 ENCOUNTER — Ambulatory Visit: Payer: Medicare Other

## 2023-11-16 DIAGNOSIS — E538 Deficiency of other specified B group vitamins: Secondary | ICD-10-CM

## 2023-11-16 MED ORDER — CYANOCOBALAMIN 1000 MCG/ML IJ SOLN
1000.0000 ug | Freq: Once | INTRAMUSCULAR | Status: AC
  Administered 2023-11-16: 1000 ug via INTRAMUSCULAR

## 2023-11-16 NOTE — Progress Notes (Signed)
Pt presented for their vitamin B12 injection. Pt was identified through two identifiers. Pt tolerated shot well in their left  deltoid.  

## 2023-12-05 ENCOUNTER — Ambulatory Visit
Admission: RE | Admit: 2023-12-05 | Discharge: 2023-12-05 | Disposition: A | Discharge status: Home / Self Care | Payer: Medicare Other | Source: Ambulatory Visit | Attending: Family | Admitting: Family

## 2023-12-05 DIAGNOSIS — Z1231 Encounter for screening mammogram for malignant neoplasm of breast: Secondary | ICD-10-CM | POA: Insufficient documentation

## 2023-12-13 ENCOUNTER — Encounter: Payer: Self-pay | Admitting: Family

## 2023-12-19 ENCOUNTER — Other Ambulatory Visit: Payer: Self-pay

## 2023-12-19 ENCOUNTER — Other Ambulatory Visit: Payer: Self-pay | Admitting: Family

## 2023-12-19 ENCOUNTER — Ambulatory Visit: Payer: Medicare Other

## 2023-12-19 DIAGNOSIS — Z1211 Encounter for screening for malignant neoplasm of colon: Secondary | ICD-10-CM

## 2023-12-19 DIAGNOSIS — G8929 Other chronic pain: Secondary | ICD-10-CM

## 2023-12-19 NOTE — Telephone Encounter (Signed)
Spoke to pt and informed her of message below  Orthopedic referral has been placed.  Medicare would not cover Tdap in the office.  She will have to do that at local pharmacy   Cologuard ordered for patient . Pt verbalized understanding

## 2023-12-23 DIAGNOSIS — M1711 Unilateral primary osteoarthritis, right knee: Secondary | ICD-10-CM | POA: Diagnosis not present

## 2023-12-27 ENCOUNTER — Ambulatory Visit: Payer: Medicare Other

## 2023-12-27 DIAGNOSIS — E538 Deficiency of other specified B group vitamins: Secondary | ICD-10-CM | POA: Diagnosis not present

## 2023-12-27 MED ORDER — CYANOCOBALAMIN 1000 MCG/ML IJ SOLN
1000.0000 ug | Freq: Once | INTRAMUSCULAR | Status: AC
  Administered 2023-12-27: 1000 ug via INTRAMUSCULAR

## 2023-12-27 NOTE — Progress Notes (Signed)
 Patient presented for B 12 injection to right deltoid, patient voiced no concerns nor showed any signs of distress during injection.

## 2023-12-28 DIAGNOSIS — M5431 Sciatica, right side: Secondary | ICD-10-CM | POA: Diagnosis not present

## 2023-12-28 DIAGNOSIS — M9903 Segmental and somatic dysfunction of lumbar region: Secondary | ICD-10-CM | POA: Diagnosis not present

## 2023-12-28 DIAGNOSIS — Z1211 Encounter for screening for malignant neoplasm of colon: Secondary | ICD-10-CM | POA: Diagnosis not present

## 2023-12-28 DIAGNOSIS — M4306 Spondylolysis, lumbar region: Secondary | ICD-10-CM | POA: Diagnosis not present

## 2023-12-28 DIAGNOSIS — M9905 Segmental and somatic dysfunction of pelvic region: Secondary | ICD-10-CM | POA: Diagnosis not present

## 2024-01-03 DIAGNOSIS — K080 Exfoliation of teeth due to systemic causes: Secondary | ICD-10-CM | POA: Diagnosis not present

## 2024-01-04 LAB — COLOGUARD: COLOGUARD: NEGATIVE

## 2024-01-27 ENCOUNTER — Ambulatory Visit (INDEPENDENT_AMBULATORY_CARE_PROVIDER_SITE_OTHER): Payer: Medicare Other

## 2024-01-27 DIAGNOSIS — E538 Deficiency of other specified B group vitamins: Secondary | ICD-10-CM | POA: Diagnosis not present

## 2024-01-27 MED ORDER — CYANOCOBALAMIN 1000 MCG/ML IJ SOLN
1000.0000 ug | Freq: Once | INTRAMUSCULAR | Status: AC
  Administered 2024-01-27: 1000 ug via INTRAMUSCULAR

## 2024-01-27 NOTE — Progress Notes (Signed)
 Patient presented for B 12 injection to right deltoid, patient voiced no concerns nor showed any signs of distress during injection.

## 2024-02-24 ENCOUNTER — Ambulatory Visit: Payer: Medicare Other

## 2024-02-24 DIAGNOSIS — E538 Deficiency of other specified B group vitamins: Secondary | ICD-10-CM | POA: Diagnosis not present

## 2024-02-24 MED ORDER — CYANOCOBALAMIN 1000 MCG/ML IJ SOLN
1000.0000 ug | Freq: Once | INTRAMUSCULAR | Status: AC
  Administered 2024-02-24: 1000 ug via INTRAMUSCULAR

## 2024-02-24 NOTE — Progress Notes (Signed)
 Patient presented for B 12 injection to left deltoid, patient voiced no concerns nor showed any signs of distress during injection.

## 2024-03-19 DIAGNOSIS — K080 Exfoliation of teeth due to systemic causes: Secondary | ICD-10-CM | POA: Diagnosis not present

## 2024-03-26 ENCOUNTER — Ambulatory Visit (INDEPENDENT_AMBULATORY_CARE_PROVIDER_SITE_OTHER)

## 2024-03-26 DIAGNOSIS — E538 Deficiency of other specified B group vitamins: Secondary | ICD-10-CM

## 2024-03-26 MED ORDER — CYANOCOBALAMIN 1000 MCG/ML IJ SOLN
1000.0000 ug | Freq: Once | INTRAMUSCULAR | Status: AC
  Administered 2024-03-26: 1000 ug via INTRAMUSCULAR

## 2024-03-26 NOTE — Progress Notes (Signed)
 Pt presented for their vitamin B12 injection. Pt was identified through two identifiers. Pt tolerated shot well in their right deltoid.

## 2024-04-03 DIAGNOSIS — H52223 Regular astigmatism, bilateral: Secondary | ICD-10-CM | POA: Diagnosis not present

## 2024-04-30 ENCOUNTER — Ambulatory Visit

## 2024-04-30 DIAGNOSIS — E538 Deficiency of other specified B group vitamins: Secondary | ICD-10-CM

## 2024-04-30 MED ORDER — CYANOCOBALAMIN 1000 MCG/ML IJ SOLN
1000.0000 ug | Freq: Once | INTRAMUSCULAR | Status: AC
  Administered 2024-04-30: 1000 ug via INTRAMUSCULAR

## 2024-04-30 NOTE — Progress Notes (Signed)
 Pt presented for their vitamin B12 injection. Pt was identified through two identifiers. Pt tolerated shot well in their left deltoid.

## 2024-05-15 ENCOUNTER — Ambulatory Visit

## 2024-06-05 ENCOUNTER — Ambulatory Visit (INDEPENDENT_AMBULATORY_CARE_PROVIDER_SITE_OTHER)

## 2024-06-05 DIAGNOSIS — E538 Deficiency of other specified B group vitamins: Secondary | ICD-10-CM

## 2024-06-05 MED ORDER — CYANOCOBALAMIN 1000 MCG/ML IJ SOLN
1000.0000 ug | Freq: Once | INTRAMUSCULAR | Status: AC
  Administered 2024-06-05: 1000 ug via INTRAMUSCULAR

## 2024-06-05 NOTE — Progress Notes (Signed)
 Patient is in office today for a nurse visit for B12 Injection. Patient Injection was given in the  Right deltoid. Patient tolerated injection well.

## 2024-07-03 ENCOUNTER — Ambulatory Visit

## 2024-07-03 DIAGNOSIS — E538 Deficiency of other specified B group vitamins: Secondary | ICD-10-CM

## 2024-07-03 MED ORDER — CYANOCOBALAMIN 1000 MCG/ML IJ SOLN
1000.0000 ug | Freq: Once | INTRAMUSCULAR | Status: AC
  Administered 2024-07-03: 1000 ug via INTRAMUSCULAR

## 2024-07-03 NOTE — Progress Notes (Signed)
 Pt received B12 injection in right/Left  deltoid Pt tolerated it well with no complaints or concerns.

## 2024-07-31 ENCOUNTER — Ambulatory Visit

## 2024-08-03 ENCOUNTER — Ambulatory Visit

## 2024-08-03 DIAGNOSIS — E538 Deficiency of other specified B group vitamins: Secondary | ICD-10-CM | POA: Diagnosis not present

## 2024-08-03 MED ORDER — CYANOCOBALAMIN 1000 MCG/ML IJ SOLN
1000.0000 ug | Freq: Once | INTRAMUSCULAR | Status: AC
  Administered 2024-08-03: 1000 ug via INTRAMUSCULAR

## 2024-08-03 NOTE — Progress Notes (Signed)
Pt received B12 injection in Left deltoid. Pt tolerated it well with no complaints or concerns.

## 2024-08-06 ENCOUNTER — Ambulatory Visit (INDEPENDENT_AMBULATORY_CARE_PROVIDER_SITE_OTHER): Admitting: *Deleted

## 2024-08-06 VITALS — Ht 64.0 in | Wt 200.0 lb

## 2024-08-06 DIAGNOSIS — Z0000 Encounter for general adult medical examination without abnormal findings: Secondary | ICD-10-CM | POA: Diagnosis not present

## 2024-08-06 NOTE — Patient Instructions (Signed)
 Ms. Pongratz , Thank you for taking time out of your busy schedule to complete your Annual Wellness Visit with me. I enjoyed our conversation and look forward to speaking with you again next year. I, as well as your care team,  appreciate your ongoing commitment to your health goals. Please review the following plan we discussed and let me know if I can assist you in the future. Your Game plan/ To Do List    Referrals: If you haven't heard from the office you've been referred to, please reach out to them at the phone provided.   Remember to update your Tetanus (Tdap) at your pharmacy  Follow up Visits: We will see or speak with you next year for your Next Medicare AWV with our clinical staff 08/07/25 @ 10:10 Have you seen your provider in the last 6 months (3 months if uncontrolled diabetes)? No Remember your upcoming appointments  Clinician Recommendations:  Aim for 30 minutes of exercise or brisk walking, 6-8 glasses of water, and 5 servings of fruits and vegetables each day.       This is a list of the screenings recommended for you:  Health Maintenance  Topic Date Due   Zoster (Shingles) Vaccine (1 of 2) Never done   DTaP/Tdap/Td vaccine (2 - Td or Tdap) 09/07/2023   Flu Shot  07/20/2024   Mammogram  12/04/2024   Medicare Annual Wellness Visit  08/06/2025   Pneumococcal Vaccine for age over 40  Completed   DEXA scan (bone density measurement)  Completed   Hepatitis C Screening  Completed   HPV Vaccine  Aged Out   Meningitis B Vaccine  Aged Out   Pneumococcal Vaccine  Discontinued   COVID-19 Vaccine  Discontinued   Cologuard (Stool DNA test)  Discontinued    Advanced directives: (Copy Requested) Please bring a copy of your health care power of attorney and living will to the office to be added to your chart at your convenience. You can mail to [facility] [address] [place], [place] [address] or email to [email or web address] Advance Care Planning is  important because it:  [x]  Makes sure you receive the medical care that is consistent with your values, goals, and preferences  [x]  It provides guidance to your family and loved ones and reduces their decisional burden about whether or not they are making the right decisions based on your wishes.

## 2024-08-06 NOTE — Progress Notes (Signed)
 Subjective:   Krista Gentry is a 78 y.o. who presents for a Medicare Wellness preventive visit.  As a reminder, Annual Wellness Visits don't include a physical exam, and some assessments may be limited, especially if this visit is performed virtually. We may recommend an in-person follow-up visit with your provider if needed.  Visit Complete: Virtual I connected with  Krista Gentry on 08/06/24 by a audio enabled telemedicine application and verified that I am speaking with the correct person using two identifiers.  Patient Location: Home  Provider Location: Home Office  I discussed the limitations of evaluation and management by telemedicine. The patient expressed understanding and agreed to proceed.  Vital Signs: Because this visit was a virtual/telehealth visit, some criteria may be missing or patient reported. Any vitals not documented were not able to be obtained and vitals that have been documented are patient reported.  VideoDeclined- This patient declined Librarian, academic. Therefore the visit was completed with audio only.  Persons Participating in Visit: Patient.  AWV Questionnaire: No: Patient Medicare AWV questionnaire was not completed prior to this visit.  Cardiac Risk Factors include: advanced age (>48men, >25 women);hypertension;obesity (BMI >30kg/m2)     Objective:    Today's Vitals   08/06/24 0814  Weight: 200 lb (90.7 kg)  Height: 5' 4 (1.626 m)   Body mass index is 34.33 kg/m.     08/06/2024    8:30 AM 05/11/2023   10:40 AM 04/22/2022    9:09 AM 10/23/2020    8:13 AM 10/15/2020    9:34 AM 10/03/2020    8:41 AM 10/03/2019    8:46 AM  Advanced Directives  Does Patient Have a Medical Advance Directive? Yes No No Yes Yes Yes Yes  Type of Estate agent of [place];Living will   Living will Living will Healthcare Power of [place];Living will Healthcare Power of [place];Living will  Does patient want to  make changes to medical advance directive?  No - Patient declined  No - Patient declined  No - Patient declined No - Patient declined  Copy of Healthcare Power of Attorney in Chart? No - copy requested   No - copy requested  No - copy requested No - copy requested  Would patient like information on creating a medical advance directive?   No - Patient declined        Current Medications (verified) Outpatient Encounter Medications as of 08/06/2024  Medication Sig   Barberry-Oreg Grape-Goldenseal (BERBERINE COMPLEX PO) Take 500 mcg by mouth daily.   cetirizine (ZYRTEC) 10 MG tablet Take 10 mg by mouth daily as needed for allergies.    Cholecalciferol  (VITAMIN D ) 50 MCG (2000 UT) tablet Take 6,000 Units by mouth daily.   meclizine  (ANTIVERT ) 12.5 MG tablet Take 1 tablet (12.5 mg total) by mouth 3 (three) times daily as needed for dizziness.   Menaquinone-7 (VITAMIN K2 PO) Take 600 mcg by mouth daily.   Multiple Minerals-Vitamins (CITRACAL MAXIMUM PLUS) TABS Take 2 tablets by mouth daily.    Multiple Vitamins-Minerals (IMMUNE SUPPORT PO) Take 2 tablets by mouth daily.   mupirocin  ointment (BACTROBAN ) 2 % Apply 1 Application topically daily.   OVER THE COUNTER MEDICATION Take 1 tablet by mouth daily. Nutratherm otc supplement   Quercetin 250 MG TABS Take 250 mg by mouth daily.    TURMERIC-GINGER PO Take by mouth.   Zinc 50 MG CAPS Take 50 mg by mouth daily.    ACETYLCARNITINE PO Take 1,500 mg by  mouth. (Patient not taking: Reported on 08/06/2024)   CRANBERRY EXTRACT PO Take 4,200 mg by mouth daily.  (Patient not taking: Reported on 08/06/2024)   mometasone  (ELOCON ) 0.1 % cream Apply 1 application topically daily. Appear pea sized ( or less) amount to external ear, and slightly in canal. (Patient not taking: Reported on 08/06/2024)   UNABLE TO FIND cbd 20 mg (Patient not taking: Reported on 08/06/2024)   No facility-administered encounter medications on file as of 08/06/2024.    Allergies  (verified) Patient has no known allergies.   History: Past Medical History:  Diagnosis Date   Allergy    Basal cell carcinoma 10/08/2020   right lateral calf   Dysplastic nevus 04/16/2021   right inferior buttock, moderate   Family history of adverse reaction to anesthesia    sister had difficult waking up from anesthesia . had pseudo cholinestrase   Past Surgical History:  Procedure Laterality Date   ABDOMINAL HYSTERECTOMY  1994   for noncancerous reason, fibroids. She has bilateral oophorectomy.   BILATERAL OOPHORECTOMY     BREAST BIOPSY     CARPAL TUNNEL RELEASE Right 10/23/2020   Procedure: RIGHT CARPAL TUNNEL RELEASE and LEFT THUMB TRIGGER INJECTION;  Surgeon: Krasinski, Kevin, MD;  Location: [facility] ORS;  Service: Orthopedics;  Laterality: Right;   CHOLECYSTECTOMY  1994   Family History  Problem Relation Age of Onset   Kidney disease Mother        nephrolithiasis   Dementia Mother    Hypertension Mother    Kidney Stones Mother    Mental retardation Sister 8       possible autism,  now with depression   Dementia Sister    Cancer Maternal Aunt        breast cancer   Dementia Maternal Aunt    Mental retardation Maternal Aunt    Breast cancer Maternal Aunt    Dementia Maternal Uncle    Cancer Maternal Grandmother        breast cancer   Breast cancer Maternal Grandmother    Heart attack Paternal Grandmother    Kidney Stones Brother    Cancer Maternal Grandfather        Stomach cancer   Colon cancer Neg Hx    Social History   Socioeconomic History   Marital status: Married    Spouse name: Not on file   Number of children: Not on file   Years of education: Not on file   Highest education level: Bachelor's degree (e.g., BA, AB, BS)  Occupational History   Occupation: Retired  Tobacco Use   Smoking status: Former    Current packs/day: 0.00    Average packs/day: 1 pack/day for 5.0 years (5.0 ttl pk-yrs)    Types: Cigarettes    Start date: 04/14/1965    Quit  date: 04/14/1970    Years since quitting: 54.3   Smokeless tobacco: Never  Vaping Use   Vaping status: Never Used  Substance and Sexual Activity   Alcohol use: Yes    Alcohol/week: 3.0 standard drinks of alcohol    Types: 2 Glasses of wine, 1 Cans of beer per week    Comment: occassion   Drug use: No   Sexual activity: Not on file  Other Topics Concern   Not on file  Social History Narrative   married   Social Drivers of Health   Financial Resource Strain: Low Risk  (08/06/2024)   Overall Financial Resource Strain (CARDIA)    Difficulty of Paying Living  Expenses: Not hard at all  Food Insecurity: No Food Insecurity (08/06/2024)   Hunger Vital Sign    Worried About Running Out of Food in the Last Year: Never true    Ran Out of Food in the Last Year: Never true  Transportation Needs: No Transportation Needs (08/06/2024)   PRAPARE - Administrator, Civil Service (Medical): No    Lack of Transportation (Non-Medical): No  Physical Activity: Insufficiently Active (08/06/2024)   Exercise Vital Sign    Days of Exercise per Week: 5 days    Minutes of Exercise per Session: 20 min  Stress: No Stress Concern Present (08/06/2024)   [employer] of Occupational Health - Occupational Stress Questionnaire    Feeling of Stress: Not at all  Social Connections: Socially Integrated (08/06/2024)   Social Connection and Isolation Panel    Frequency of Communication with Friends and Family: More than three times a week    Frequency of Social Gatherings with Friends and Family: Once a week    Attends Religious Services: More than 4 times per year    Active Member of [employer] or Organizations: Yes    Attends Engineer, structural: More than 4 times per year    Marital Status: Married    Tobacco Counseling Counseling given: Not Answered    Clinical Intake:  Pre-visit preparation completed: Yes  Pain : No/denies pain     BMI - recorded: 34.33 Nutritional Status: BMI >  30  Obese Nutritional Risks: None Diabetes: No  Lab Results  Component Value Date   HGBA1C 5.5 05/11/2022   HGBA1C 5.3 08/31/2021   HGBA1C 5.1 04/17/2021     How often do you need to have someone help you when you read instructions, pamphlets, or other written materials from your doctor or pharmacy?: 1 - Never  Interpreter Needed?: No  Information entered by :: R. Amere Iott LPN   Activities of Daily Living     08/06/2024    8:15 AM  In your present state of health, do you have any difficulty performing the following activities:  Hearing? 0  Vision? 0  Difficulty concentrating or making decisions? 0  Walking or climbing stairs? 1  Dressing or bathing? 0  Doing errands, shopping? 0  Preparing Food and eating ? N  Using the Toilet? N  In the past six months, have you accidently leaked urine? Y  Do you have problems with loss of bowel control? N  Managing your Medications? N  Managing your Finances? N  Housekeeping or managing your Housekeeping? N    Patient Care Team: Dineen Rollene MATSU, FNP as PCP - General (Family Medicine)  I have updated your Care Teams any recent Medical Services you may have received from other providers in the past year.     Assessment:   This is a routine wellness examination for [place].  Hearing/Vision screen Hearing Screening - Comments:: No issues Vision Screening - Comments:: glasses   Goals Addressed             This Visit's Progress    Patient Stated       Wants to lose weight       Depression Screen     08/06/2024    8:25 AM 05/24/2023    8:44 AM 05/13/2023   12:24 PM 05/04/2023    3:54 PM 01/05/2023    9:22 AM 04/22/2022    9:08 AM 11/10/2021   10:00 AM  PHQ 2/9 Scores  PHQ - 2  Score 3 0 0 0 0 0 0  PHQ- 9 Score 8    3      Fall Risk     08/06/2024    8:17 AM 05/07/2023   11:15 AM 05/04/2023    3:53 PM 01/05/2023    9:13 AM 04/22/2022    9:08 AM  Fall Risk   Falls in the past year? 1 1 0 0 0  Number falls in past yr: 1  0 0 0 0  Injury with Fall? 0 0 0 0   Risk for fall due to : History of fall(s);Impaired balance/gait  No Fall Risks No Fall Risks   Follow up Falls prevention discussed  Falls evaluation completed Falls evaluation completed  Falls evaluation completed      Data saved with a previous flowsheet row definition    MEDICARE RISK AT HOME:  Medicare Risk at Home Any stairs in or around the home?: Yes If so, are there any without handrails?: No Home free of loose throw rugs in walkways, pet beds, electrical cords, etc?: Yes Adequate lighting in your home to reduce risk of falls?: Yes Life alert?: Yes Use of a cane, walker or w/c?: No Grab bars in the bathroom?: Yes Shower chair or bench in shower?: Yes Elevated toilet seat or a handicapped toilet?: No  TIMED UP AND GO:  Was the test performed?  No  Cognitive Function: 6CIT completed    09/27/2018    8:53 AM  MMSE - Mini Mental State Exam  Orientation to time 5  Orientation to Place 5  Registration 3  Attention/ Calculation 5  Recall 3  Language- name 2 objects 2  Language- repeat 1  Language- follow 3 step command 3  Language- read & follow direction 1  Write a sentence 1  Copy design 1  Total score 30        08/06/2024    8:31 AM 05/24/2023    8:43 AM 10/03/2019    8:54 AM  6CIT Screen  What Year? 0 points 0 points 0 points  What month? 0 points 0 points 0 points  What time? 0 points 0 points 0 points  Count back from 20 0 points 0 points 0 points  Months in reverse 0 points 0 points 0 points  Repeat phrase 0 points 0 points 0 points  Total Score 0 points 0 points 0 points    Immunizations Immunization History  Administered Date(s) Administered   Influenza Split 10/05/2014   Influenza, High Dose Seasonal PF 10/23/2018   Influenza-Unspecified 09/06/2013   PPD Test 08/15/2013   Pneumococcal Conjugate-13 08/03/2017   Pneumococcal Polysaccharide-23 08/15/2013   Tdap 09/06/2013    Screening Tests Health  Maintenance  Topic Date Due   Zoster Vaccines- Shingrix (1 of 2) Never done   DTaP/Tdap/Td (2 - Td or Tdap) 09/07/2023   INFLUENZA VACCINE  07/20/2024   MAMMOGRAM  12/04/2024   Medicare Annual Wellness (AWV)  08/06/2025   Pneumococcal Vaccine: 50+ Years  Completed   DEXA SCAN  Completed   Hepatitis C Screening  Completed   HPV VACCINES  Aged Out   Meningococcal B Vaccine  Aged Out   Pneumococcal Vaccine  Discontinued   COVID-19 Vaccine  Discontinued   Fecal DNA (Cologuard)  Discontinued    Health Maintenance  Health Maintenance Due  Topic Date Due   Zoster Vaccines- Shingrix (1 of 2) Never done   DTaP/Tdap/Td (2 - Td or Tdap) 09/07/2023   INFLUENZA VACCINE  07/20/2024  Health Maintenance Items Addressed: Discussed the need to update vaccines. Patient declines vaccines but will consider the tetanus (Tdap) vaccine.  Additional Screening:  Vision Screening: Recommended annual ophthalmology exams for early detection of glaucoma and other disorders of the eye. Up to date [facility] Would you like a referral to an eye doctor? No    Dental Screening: Recommended annual dental exams for proper oral hygiene  Community Resource Referral / Chronic Care Management: CRR required this visit?  No   CCM required this visit?  No   Plan:    I have personally reviewed and noted the following in the patient's chart:   Medical and social history Use of alcohol, tobacco or illicit drugs  Current medications and supplements including opioid prescriptions. Patient is not currently taking opioid prescriptions. Functional ability and status Nutritional status Physical activity Advanced directives List of other physicians Hospitalizations, surgeries, and ER visits in previous 12 months Vitals Screenings to include cognitive, depression, and falls Referrals and appointments  In addition, I have reviewed and discussed with patient certain preventive protocols, quality metrics, and  best practice recommendations. A written personalized care plan for preventive services as well as general preventive health recommendations were provided to patient.   Angeline Fredericks, LPN   1/81/7974   After Visit Summary: (MyChart) Due to this being a telephonic visit, the after visit summary with patients personalized plan was offered to patient via MyChart   Notes: Nothing significant to report at this time.

## 2024-09-05 ENCOUNTER — Ambulatory Visit (INDEPENDENT_AMBULATORY_CARE_PROVIDER_SITE_OTHER)

## 2024-09-05 DIAGNOSIS — E538 Deficiency of other specified B group vitamins: Secondary | ICD-10-CM | POA: Diagnosis not present

## 2024-09-05 MED ORDER — CYANOCOBALAMIN 1000 MCG/ML IJ SOLN
1000.0000 ug | Freq: Once | INTRAMUSCULAR | Status: AC
  Administered 2024-09-05: 1000 ug via INTRAMUSCULAR

## 2024-09-05 NOTE — Progress Notes (Signed)
 Patient was administered a B12 injection into her right deltoid. Patient tolerated the B12 injection well.

## 2024-09-11 ENCOUNTER — Ambulatory Visit (INDEPENDENT_AMBULATORY_CARE_PROVIDER_SITE_OTHER): Admitting: Family

## 2024-09-11 ENCOUNTER — Encounter: Payer: Self-pay | Admitting: Family

## 2024-09-11 VITALS — BP 130/82 | HR 65 | Temp 97.8°F | Ht 64.0 in | Wt 200.4 lb

## 2024-09-11 DIAGNOSIS — N3281 Overactive bladder: Secondary | ICD-10-CM

## 2024-09-11 DIAGNOSIS — Z13220 Encounter for screening for lipoid disorders: Secondary | ICD-10-CM

## 2024-09-11 DIAGNOSIS — I10 Essential (primary) hypertension: Secondary | ICD-10-CM

## 2024-09-11 DIAGNOSIS — E559 Vitamin D deficiency, unspecified: Secondary | ICD-10-CM

## 2024-09-11 DIAGNOSIS — C4491 Basal cell carcinoma of skin, unspecified: Secondary | ICD-10-CM

## 2024-09-11 DIAGNOSIS — E538 Deficiency of other specified B group vitamins: Secondary | ICD-10-CM | POA: Diagnosis not present

## 2024-09-11 DIAGNOSIS — Z1231 Encounter for screening mammogram for malignant neoplasm of breast: Secondary | ICD-10-CM

## 2024-09-11 DIAGNOSIS — Z780 Asymptomatic menopausal state: Secondary | ICD-10-CM

## 2024-09-11 DIAGNOSIS — Z136 Encounter for screening for cardiovascular disorders: Secondary | ICD-10-CM | POA: Diagnosis not present

## 2024-09-11 DIAGNOSIS — R42 Dizziness and giddiness: Secondary | ICD-10-CM | POA: Diagnosis not present

## 2024-09-11 LAB — COMPREHENSIVE METABOLIC PANEL WITH GFR
ALT: 9 U/L (ref 0–35)
AST: 15 U/L (ref 0–37)
Albumin: 4.3 g/dL (ref 3.5–5.2)
Alkaline Phosphatase: 50 U/L (ref 39–117)
BUN: 20 mg/dL (ref 6–23)
CO2: 27 meq/L (ref 19–32)
Calcium: 9.7 mg/dL (ref 8.4–10.5)
Chloride: 103 meq/L (ref 96–112)
Creatinine, Ser: 0.96 mg/dL (ref 0.40–1.20)
GFR: 56.94 mL/min — ABNORMAL LOW (ref >60.00)
Glucose, Bld: 84 mg/dL (ref 70–99)
Potassium: 4.8 meq/L (ref 3.5–5.1)
Sodium: 140 meq/L (ref 135–145)
Total Bilirubin: 0.5 mg/dL (ref 0.2–1.2)
Total Protein: 6.9 g/dL (ref 6.0–8.3)

## 2024-09-11 LAB — LIPID PANEL
Cholesterol: 203 mg/dL — ABNORMAL HIGH (ref 0–200)
HDL: 83.6 mg/dL (ref >39.00)
LDL Cholesterol: 98 mg/dL (ref 0–99)
NonHDL: 119.38
Total CHOL/HDL Ratio: 2
Triglycerides: 105 mg/dL (ref 0.0–149.0)
VLDL: 21 mg/dL (ref 0.0–40.0)

## 2024-09-11 LAB — URINALYSIS, ROUTINE W REFLEX MICROSCOPIC
Bilirubin Urine: NEGATIVE
Ketones, ur: NEGATIVE
Leukocytes,Ua: NEGATIVE
Nitrite: NEGATIVE
Specific Gravity, Urine: 1.025 (ref 1.000–1.030)
Total Protein, Urine: NEGATIVE
Urine Glucose: NEGATIVE
Urobilinogen, UA: 0.2 (ref 0.0–1.0)
pH: 5.5 (ref 5.0–8.0)

## 2024-09-11 LAB — CBC WITH DIFFERENTIAL/PLATELET
Basophils Absolute: 0.1 K/uL (ref 0.0–0.1)
Basophils Relative: 1.2 % (ref 0.0–3.0)
Eosinophils Absolute: 0.1 K/uL (ref 0.0–0.7)
Eosinophils Relative: 2.7 % (ref 0.0–5.0)
HCT: 42.8 % (ref 36.0–46.0)
Hemoglobin: 13.8 g/dL (ref 12.0–15.0)
Lymphocytes Relative: 26.6 % (ref 12.0–46.0)
Lymphs Abs: 1.4 K/uL (ref 0.7–4.0)
MCHC: 32.3 g/dL (ref 30.0–36.0)
MCV: 86 fl (ref 78.0–100.0)
Monocytes Absolute: 0.4 K/uL (ref 0.1–1.0)
Monocytes Relative: 7.9 % (ref 3.0–12.0)
Neutro Abs: 3.3 K/uL (ref 1.4–7.7)
Neutrophils Relative %: 61.6 % (ref 43.0–77.0)
Platelets: 173 K/uL (ref 150.0–400.0)
RBC: 4.98 Mil/uL (ref 3.87–5.11)
RDW: 14.8 % (ref 11.5–15.5)
WBC: 5.4 K/uL (ref 4.0–10.5)

## 2024-09-11 LAB — B12 AND FOLATE PANEL
Folate: 20.1 ng/mL (ref >5.9)
Vitamin B-12: 741 pg/mL (ref 211–911)

## 2024-09-11 LAB — VITAMIN D 25 HYDROXY (VIT D DEFICIENCY, FRACTURES): VITD: 23.57 ng/mL — ABNORMAL LOW (ref 30.00–100.00)

## 2024-09-11 LAB — IBC + FERRITIN
Ferritin: 68.7 ng/mL (ref 10.0–291.0)
Iron: 71 ug/dL (ref 42–145)
Saturation Ratios: 18.4 % — ABNORMAL LOW (ref 20.0–50.0)
TIBC: 385 ug/dL (ref 250.0–450.0)
Transferrin: 275 mg/dL (ref 212.0–360.0)

## 2024-09-11 NOTE — Assessment & Plan Note (Signed)
 Reiterated the importance of follow up with [facility]. She will call to schedule.

## 2024-09-11 NOTE — Assessment & Plan Note (Addendum)
 Symptoms c/w OAB.  She declines trial of trospium. Pending UA. Will monitor.

## 2024-09-11 NOTE — Addendum Note (Signed)
 Addended by: MORGAN LEVORA RAMAN on: 09/11/2024 09:54 AM   Modules accepted: Orders

## 2024-09-11 NOTE — Progress Notes (Signed)
 Assessment & Plan:  OAB (overactive bladder) Assessment & Plan: Symptoms c/w OAB.  She declines trial of trospium. Pending UA. Will monitor.   Orders: -     Urinalysis, Routine w reflex microscopic  Encounter for lipid screening for cardiovascular disease -     Lipid panel  Dizziness Assessment & Plan: Fortunately no recurrence. Will continue to monitor.   Orders: -     Comprehensive metabolic panel with GFR -     IBC + Ferritin -     CBC with Differential/Platelet  Vitamin D  deficiency -     VITAMIN D  25 Hydroxy (Vit-D Deficiency, Fractures)  B12 deficiency -     B12 and Folate Panel  Primary hypertension Assessment & Plan: Chronic, stable without antihypertensive.    Basal cell carcinoma (BCC), unspecified site Assessment & Plan: Reiterated the importance of follow up with [facility]. She will call to schedule.       Return precautions given.   Risks, benefits, and alternatives of the medications and treatment plan prescribed today were discussed, and patient expressed understanding.   Education regarding symptom management and diagnosis given to patient on AVS either electronically or printed.  No follow-ups on file.  Krista Northern, FNP  Subjective:    Patient ID: Krista Gentry, female    DOB: 1946/02/04, 78 y.o.   MRN: [id number]  CC: Krista Gentry is a 78 y.o. female who presents today for follow up.   HPI: HPI Discussed the use of AI scribe software for clinical note transcription with the patient, who gave verbal consent to proceed.  History of Present Illness   Krista Gentry is a 78 year old female who presents for follow up and concerns with urinary incontinence.  She experiences urinary incontinence with both urgency . The urgency is significant, as she cannot reach the bathroom in time once the urge arises. She also has frequent nocturia, which she attributes to increased water intake later in the day. Denies dysuria,hematuria, or  recent urinary tract infections.  She receives a monthly B12 injection without side effects, though it has not notably improved her energy levels. H/o Vitamin d  deficiency  She is also due for a bone density scan and a mammogram.  Negative  Cologuard test in January 2025.  She has a history of syncope but reports no recent dizziness or fainting.  She uses an app called [employer] for daily exercises, which includes a physical therapist and health coach.       Allergies: Patient has no allergy information on record. Current Outpatient Medications on File Prior to Visit  Medication Sig Dispense Refill   ACETYLCARNITINE PO Take 1,500 mg by mouth.     Barberry-Oreg Grape-Goldenseal (BERBERINE COMPLEX PO) Take 500 mcg by mouth daily.     cetirizine (ZYRTEC) 10 MG tablet Take 10 mg by mouth daily as needed for allergies.      Cholecalciferol  (VITAMIN D ) 50 MCG (2000 UT) tablet Take 6,000 Units by mouth daily.     CRANBERRY EXTRACT PO Take 4,200 mg by mouth daily.      meclizine  (ANTIVERT ) 12.5 MG tablet Take 1 tablet (12.5 mg total) by mouth 3 (three) times daily as needed for dizziness. 30 tablet 0   Menaquinone-7 (VITAMIN K2 PO) Take 600 mcg by mouth daily.     mometasone  (ELOCON ) 0.1 % cream Apply 1 application topically daily. Appear pea sized ( or less) amount to external ear, and slightly in canal. 15 g 1  Multiple Minerals-Vitamins (CITRACAL MAXIMUM PLUS) TABS Take 2 tablets by mouth daily.      Multiple Vitamins-Minerals (IMMUNE SUPPORT PO) Take 2 tablets by mouth daily.     mupirocin  ointment (BACTROBAN ) 2 % Apply 1 Application topically daily. 22 g 0   OVER THE COUNTER MEDICATION Take 1 tablet by mouth daily. Nutratherm otc supplement     Quercetin 250 MG TABS Take 250 mg by mouth daily.      TURMERIC-GINGER PO Take by mouth.     UNABLE TO FIND cbd 20 mg     Zinc 50 MG CAPS Take 50 mg by mouth daily.      No current facility-administered medications on file prior to visit.     Review of Systems  Constitutional:  Negative for chills, fever and unexpected weight change.  HENT:  Negative for congestion.   Respiratory:  Negative for cough.   Cardiovascular:  Negative for chest pain, palpitations and leg swelling.  Gastrointestinal:  Negative for nausea and vomiting.  Genitourinary:  Positive for urgency. Negative for difficulty urinating, dysuria and frequency.  Musculoskeletal:  Negative for arthralgias and myalgias.  Skin:  Negative for rash.  Neurological:  Negative for headaches.  Hematological:  Negative for adenopathy.  Psychiatric/Behavioral:  Negative for confusion.       Objective:    BP 130/82   Pulse 65   Temp 97.8 F (36.6 C) (Oral)   Ht 5' 4 (1.626 m)   Wt 200 lb 6.4 oz (90.9 kg)   SpO2 95%   BMI 34.40 kg/m  BP Readings from Last 3 Encounters:  09/11/24 130/82  01/05/23 (!) 140/78  05/14/22 124/68   Wt Readings from Last 3 Encounters:  09/11/24 200 lb 6.4 oz (90.9 kg)  08/06/24 200 lb (90.7 kg)  05/04/23 188 lb 3.2 oz (85.4 kg)    Physical Exam Vitals reviewed.  Constitutional:      Appearance: She is well-developed.  Eyes:     Conjunctiva/sclera: Conjunctivae normal.  Cardiovascular:     Rate and Rhythm: Normal rate and regular rhythm.     Pulses: Normal pulses.     Heart sounds: Normal heart sounds.  Pulmonary:     Effort: Pulmonary effort is normal.     Breath sounds: Normal breath sounds. No wheezing, rhonchi or rales.  Skin:    General: Skin is warm and dry.  Neurological:     Mental Status: She is alert.  Psychiatric:        Speech: Speech normal.        Behavior: Behavior normal.        Thought Content: Thought content normal.

## 2024-09-11 NOTE — Patient Instructions (Addendum)
 I would consideration of trial trospium for overactive bladder.   Please call  and schedule your 3D mammogram and /or bone density scan as we discussed.   [facility]  ( new location in 2023)  [address], [place], [place] [address]  [place], [place]  [phone]

## 2024-09-11 NOTE — Assessment & Plan Note (Signed)
 Chronic, stable without antihypertensive.

## 2024-09-11 NOTE — Assessment & Plan Note (Signed)
 Fortunately no recurrence. Will continue to monitor.

## 2024-09-27 ENCOUNTER — Ambulatory Visit: Payer: Self-pay | Admitting: Family

## 2024-09-27 ENCOUNTER — Encounter: Payer: Self-pay | Admitting: Family

## 2024-10-01 ENCOUNTER — Ambulatory Visit

## 2024-10-01 DIAGNOSIS — L814 Other melanin hyperpigmentation: Secondary | ICD-10-CM

## 2024-10-01 DIAGNOSIS — Z1283 Encounter for screening for malignant neoplasm of skin: Secondary | ICD-10-CM

## 2024-10-01 DIAGNOSIS — L578 Other skin changes due to chronic exposure to nonionizing radiation: Secondary | ICD-10-CM

## 2024-10-01 DIAGNOSIS — L821 Other seborrheic keratosis: Secondary | ICD-10-CM

## 2024-10-01 DIAGNOSIS — D1801 Hemangioma of skin and subcutaneous tissue: Secondary | ICD-10-CM

## 2024-10-01 DIAGNOSIS — C4490 Unspecified malignant neoplasm of skin, unspecified: Secondary | ICD-10-CM

## 2024-10-01 DIAGNOSIS — W908XXA Exposure to other nonionizing radiation, initial encounter: Secondary | ICD-10-CM

## 2024-10-01 DIAGNOSIS — Z85828 Personal history of other malignant neoplasm of skin: Secondary | ICD-10-CM

## 2024-10-01 DIAGNOSIS — D229 Melanocytic nevi, unspecified: Secondary | ICD-10-CM

## 2024-10-01 NOTE — Progress Notes (Signed)
    Subjective   Krista Gentry is a 78 y.o. female who presents for the following: Total body skin exam for skin cancer screening and mole check. The patient has spots, moles and lesions to be evaluated, some may be new or changing and the patient may have concern these could be cancer.. Patient is established patient   Today patient reports: No spots of concern today.   Review of Systems:    No other skin or systemic complaints except as noted in HPI or Assessment and Plan.  The following portions of the chart were reviewed this encounter and updated as appropriate: medications, allergies, medical history  Relevant Medical History:  Personal history of non melanoma skin cancer - see medical history for full details   Objective  Well appearing patient in no apparent distress; mood and affect are within normal limits. Examination was performed of the: Full Skin Examination: scalp, head, eyes, ears, nose, lips, neck, chest, axillae, abdomen, back, buttocks, bilateral upper extremities, bilateral lower extremities, hands, feet, fingers, toes, fingernails, and toenails.   Examination notable for: SKIN EXAM, Angioma(s): Scattered red vascular papule(s)  , Lentigo/lentigines: Scattered pigmented macules that are tan to brown in color and are somewhat non-uniform in shape and concentrated in the sun-exposed areas, Nevus/nevi: Scattered well-demarcated, regular, pigmented macule(s) and/or papule(s)  , Seborrheic Keratosis(es): Stuck-on appearing keratotic papule(s) on the trunk, none  irritated with redness, crusting, edema, and/or partial avulsion, Actinic Damage/Elastosis: chronic sun damage: dyspigmentation, telangiectasia, and wrinkling  Examination limited by: Makeup      Assessment & Plan   SKIN CANCER SCREENING PERFORMED TODAY.  BENIGN SKIN FINDINGS  - Lentigines  - Seborrheic keratoses  - Hemangiomas   - Nevus/Multiple Benign Nevi - Reassurance provided regarding the benign  appearance of lesions noted on exam today; no treatment is indicated in the absence of symptoms/changes. - Reinforced importance of photoprotective strategies including liberal and frequent sunscreen use of a broad-spectrum SPF 30 or greater, use of protective clothing, and sun avoidance for prevention of cutaneous malignancy and photoaging.  Counseled patient on the importance of regular self-skin monitoring as well as routine clinical skin examinations as scheduled.   ACTINIC DAMAGE - Chronic condition, secondary to cumulative UV/sun exposure - Recommend daily broad spectrum sunscreen SPF 30+ to sun-exposed areas, reapply every 2 hours as needed.  - Staying in the shade or wearing long sleeves, sun glasses (UVA+UVB protection) and wide brim hats (4-inch brim around the entire circumference of the hat) are also recommended for sun protection.  - Call for new or changing lesions.  Personal history of non melanoma skin cancer  - Reviewed medical history for full details  - Reviewed sun protective measures as above - Encouraged full body skin exams     Level of service outlined above   Procedures, orders, diagnosis for this visit:    There are no diagnoses linked to this encounter.  Return to clinic: Return in about 1 year (around 10/01/2025) for TBSE, Hx BCC, Hx Dysplastic Nevus.  Documentation: I have reviewed the above documentation for accuracy and completeness, and I agree with the above.  Lauraine JAYSON Kanaris, MD

## 2024-10-01 NOTE — Patient Instructions (Signed)

## 2024-10-03 DIAGNOSIS — K080 Exfoliation of teeth due to systemic causes: Secondary | ICD-10-CM | POA: Diagnosis not present

## 2024-10-08 ENCOUNTER — Ambulatory Visit (INDEPENDENT_AMBULATORY_CARE_PROVIDER_SITE_OTHER)

## 2024-10-08 DIAGNOSIS — E538 Deficiency of other specified B group vitamins: Secondary | ICD-10-CM | POA: Diagnosis not present

## 2024-10-08 MED ORDER — CYANOCOBALAMIN 1000 MCG/ML IJ SOLN
1000.0000 ug | Freq: Once | INTRAMUSCULAR | Status: AC
  Administered 2024-10-08: 1000 ug via INTRAMUSCULAR

## 2024-10-08 NOTE — Progress Notes (Signed)
 Pt presented for their vitamin B12 injection. Pt was identified through two identifiers. Pt tolerated shot well in their left deltoid.

## 2024-10-09 ENCOUNTER — Ambulatory Visit

## 2024-10-09 ENCOUNTER — Other Ambulatory Visit: Payer: Self-pay | Admitting: Family

## 2024-10-09 DIAGNOSIS — E785 Hyperlipidemia, unspecified: Secondary | ICD-10-CM

## 2024-10-18 ENCOUNTER — Ambulatory Visit
Admission: RE | Admit: 2024-10-18 | Discharge: 2024-10-18 | Disposition: A | Discharge status: Home / Self Care | Payer: Self-pay | Source: Ambulatory Visit | Attending: Family | Admitting: Family

## 2024-10-18 DIAGNOSIS — E785 Hyperlipidemia, unspecified: Secondary | ICD-10-CM | POA: Insufficient documentation

## 2024-10-30 ENCOUNTER — Encounter: Payer: Self-pay | Admitting: Family

## 2024-10-30 ENCOUNTER — Ambulatory Visit: Payer: Self-pay | Admitting: Family

## 2024-11-03 ENCOUNTER — Other Ambulatory Visit: Payer: Self-pay | Admitting: Family

## 2024-11-03 DIAGNOSIS — R911 Solitary pulmonary nodule: Secondary | ICD-10-CM

## 2024-11-03 NOTE — Progress Notes (Signed)
 close

## 2024-11-07 ENCOUNTER — Ambulatory Visit: Admitting: Student in an Organized Health Care Education/Training Program

## 2024-11-07 ENCOUNTER — Encounter: Payer: Self-pay | Admitting: Student in an Organized Health Care Education/Training Program

## 2024-11-07 VITALS — BP 136/78 | HR 65 | Temp 97.7°F | Ht 64.0 in | Wt 205.0 lb

## 2024-11-07 DIAGNOSIS — R911 Solitary pulmonary nodule: Secondary | ICD-10-CM

## 2024-11-07 NOTE — Progress Notes (Signed)
 Assessment & Plan:   Assessment & Plan  #Left lower lobe subcentimeter ground glass pulmonary nodule  Nodule Location: LLL Nodule Size: 4 mm Nodule Spiculation: No Associated Lymphadenopathy: No Smoking Status (never) Extrathoracic cancer > 5 years prior (no) SPN malignancy risk score [facility]): 4 % risk of malignancy ECOG: 0  Subcentimeter ground glass nodule in left lower lobe likely benign, but age increases lung cancer risk. Follow-up required. Will obtain a CT scan at the 6 month mark, and if stable repeat at the 1 year mark.  - Order CT scan of lung in 6 months without contrast to monitor nodule stability. - If stable, repeat CT scan in 1 year. - If changes, consider biopsy or surgical intervention.   - CT CHEST WO CONTRAST; Future   Return in about 6 months (around 05/07/2025).  Belva November, MD [facility]  I spent 60 minutes caring for this patient today, including preparing to see the patient, obtaining a medical history , reviewing a separately obtained history, performing a medically appropriate examination and/or evaluation, counseling and educating the patient/family/caregiver, ordering medications, tests, or procedures, documenting clinical information in the electronic health record, and independently interpreting results (not separately reported/billed) and communicating results to the patient/family/caregiver  End of visit medications:  No orders of the defined types were placed in this encounter.    Current Outpatient Medications:    ACETYLCARNITINE PO, Take 1,500 mg by mouth., Disp: , Rfl:    Barberry-Oreg Grape-Goldenseal (BERBERINE COMPLEX PO), Take 500 mcg by mouth daily., Disp: , Rfl:    cetirizine (ZYRTEC) 10 MG tablet, Take 10 mg by mouth daily as needed for allergies. , Disp: , Rfl:    Cholecalciferol  (VITAMIN D ) 50 MCG (2000 UT) tablet, Take 6,000 Units by mouth daily., Disp: , Rfl:    CRANBERRY EXTRACT PO,  Take 4,200 mg by mouth daily. , Disp: , Rfl:    meclizine  (ANTIVERT ) 12.5 MG tablet, Take 1 tablet (12.5 mg total) by mouth 3 (three) times daily as needed for dizziness., Disp: 30 tablet, Rfl: 0   Menaquinone-7 (VITAMIN K2 PO), Take 600 mcg by mouth daily., Disp: , Rfl:    mometasone  (ELOCON ) 0.1 % cream, Apply 1 application topically daily. Appear pea sized ( or less) amount to external ear, and slightly in canal., Disp: 15 g, Rfl: 1   Multiple Minerals-Vitamins (CITRACAL MAXIMUM PLUS) TABS, Take 2 tablets by mouth daily. , Disp: , Rfl:    Multiple Vitamins-Minerals (IMMUNE SUPPORT PO), Take 2 tablets by mouth daily., Disp: , Rfl:    mupirocin  ointment (BACTROBAN ) 2 %, Apply 1 Application topically daily., Disp: 22 g, Rfl: 0   OVER THE COUNTER MEDICATION, Take 1 tablet by mouth daily. Nutratherm otc supplement, Disp: , Rfl:    Quercetin 250 MG TABS, Take 250 mg by mouth daily. , Disp: , Rfl:    TURMERIC-GINGER PO, Take by mouth., Disp: , Rfl:    UNABLE TO FIND, cbd 20 mg, Disp: , Rfl:    Zinc 50 MG CAPS, Take 50 mg by mouth daily. , Disp: , Rfl:    Subjective:   PATIENT ID: [name] GENDER: female DOB: August 04, 1946, MRN: [id number]  Chief Complaint  Patient presents with   Consult    Here to follow up on CT scan done 10/18/2024. Patient denies cough, shortness of breath or wheezing.     HPI  Discussed the use of AI scribe software for clinical note transcription with the patient,  who gave verbal consent to proceed.  History of Present Illness INDIYA IZQUIERDO is a 78 year old female who presents for follow-up evaluation of a left lower lobe subcentimeter ground glass nodule. She was referred by her primary care physician for further evaluation of the lung nodule.  A left lower lobe subcentimeter ground glass nodule was identified on a CT scan. She has no symptoms such as shortness of breath, wheezing, coughing, or phlegm production. She has a history of postnasal drip, seasonal  allergies, and occasional bronchitis, which sometimes progressed to walking pneumonia. No history of asthma or significant secondhand smoke exposure, although her family members, including her mother and sister, were smokers. She has a titanium marker in her breast from a previous concern for cancer, but she does not believe it is related to the lung nodule.  She has a history of elevated cholesterol, with a recent level of 203 mg/dL, which prompted the initial CT scan. She is not currently on any medications and prefers to avoid medications and shots, citing a negative experience with a flu shot in 2019 that resulted in a prolonged upper respiratory illness.  Her family history includes a grandmother with asthma. Her mother smoked until entering memory care, and her father was a smoker who quit. Her sister, who is 21 years younger, currently smokes and resides in assisted living.  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 She has a history of engaging in glasswork as a hobby, which involved the use of solder. She is of [place] descent from her mother's side.   Ancillary information including prior medications, full medical/surgical/family/social histories, and PFTs (when available) are listed below and have been reviewed.   Review of Systems  Constitutional:  Negative for chills, fever and weight loss.  Respiratory:  Negative for cough, hemoptysis, sputum production, shortness of breath and wheezing.   Cardiovascular:  Negative for chest pain.     Objective:   Vitals:   11/07/24 0930  BP: 136/78  Pulse: 65  Temp: 97.7 F (36.5 C)  TempSrc: Temporal  SpO2: 96%  Weight: 205 lb (93 kg)  Height: 5' 4 (1.626 m)   96% on RA  BMI Readings from Last 3 Encounters:  11/07/24 35.19 kg/m  09/11/24 34.40 kg/m  08/06/24 34.33 kg/m   Wt Readings from Last 3 Encounters:  11/07/24 205 lb (93 kg)  09/11/24 200 lb 6.4 oz (90.9 kg)   08/06/24 200 lb (90.7 kg)    Physical Exam Constitutional:      Appearance: Normal appearance.  Cardiovascular:     Rate and Rhythm: Normal rate and regular rhythm.     Pulses: Normal pulses.     Heart sounds: Normal heart sounds.  Pulmonary:     Effort: Pulmonary effort is normal.     Breath sounds: Normal breath sounds.  Neurological:     General: No focal deficit present.     Mental Status: She is alert and oriented to person, place, and time. Mental status is at baseline.       Ancillary Information    Past Medical History:  Diagnosis Date   Allergy    Basal cell carcinoma 10/08/2020   right lateral calf   Dysplastic nevus 04/16/2021   right inferior buttock, moderate   Family history of adverse reaction to anesthesia    sister had difficult waking up from anesthesia . had pseudo cholinestrase     Family History  Problem Relation Age of Onset   Kidney disease Mother        nephrolithiasis   Dementia Mother    Hypertension Mother    Kidney Stones Mother    Mental retardation Sister 8       possible autism,  now with depression   Dementia Sister    Cancer Maternal Aunt        breast cancer   Dementia Maternal Aunt    Mental retardation Maternal Aunt    Breast cancer Maternal Aunt    Dementia Maternal Uncle    Cancer Maternal Grandmother        breast cancer   Breast cancer Maternal Grandmother    Heart attack Paternal Grandmother    Kidney Stones Brother    Cancer Maternal Grandfather        Stomach cancer   Colon cancer Neg Hx      Past Surgical History:  Procedure Laterality Date   ABDOMINAL HYSTERECTOMY  1994   for noncancerous reason, fibroids. She has bilateral oophorectomy.   BILATERAL OOPHORECTOMY     BREAST BIOPSY     CARPAL TUNNEL RELEASE Right 10/23/2020   Procedure: RIGHT CARPAL TUNNEL RELEASE and LEFT THUMB TRIGGER INJECTION;  Surgeon: Krasinski, Kevin, MD;  Location: [facility] ORS;  Service: Orthopedics;  Laterality: Right;    CHOLECYSTECTOMY  1994    Social History   Socioeconomic History   Marital status: Married    Spouse name: Not on file   Number of children: Not on file   Years of education: Not on file   Highest education level: Bachelor's degree (e.g., BA, AB, BS)  Occupational History   Occupation: Retired  Tobacco Use   Smoking status: Former    Current packs/day: 0.00    Average packs/day: 1 pack/day for 5.0 years (5.0 ttl pk-yrs)    Types: Cigarettes    Start date: 04/14/1965    Quit date: 04/14/1970    Years since quitting: 54.6   Smokeless tobacco: Never  Vaping Use   Vaping status: Never Used  Substance and Sexual Activity   Alcohol use: Yes    Alcohol/week: 3.0 standard drinks of alcohol    Types: 2 Glasses of wine, 1 Cans of beer per week    Comment: occassion   Drug use: No   Sexual activity: Not on file  Other Topics Concern   Not on file  Social History Narrative   married   Social Drivers of Corporate Investment Banker Strain: Low Risk  (08/06/2024)   Overall Financial Resource Strain (CARDIA)    Difficulty of Paying Living Expenses: Not hard at all  Food Insecurity: No Food Insecurity (08/06/2024)   Hunger Vital Sign    Worried About Running Out of Food in the Last Year: Never true    Ran Out of Food in the Last Year: Never true  Transportation Needs: No Transportation Needs (08/06/2024)   PRAPARE - Administrator, Civil Service (Medical): No    Lack of Transportation (Non-Medical): No  Physical Activity: Insufficiently Active (08/06/2024)   Exercise Vital Sign    Days of Exercise per Week: 5 days    Minutes of Exercise per Session: 20 min  Stress: No Stress Concern Present (08/06/2024)   [employer] of Occupational Health - Occupational Stress Questionnaire    Feeling of Stress: Not at all  Social Connections: Socially Integrated (08/06/2024)   Social Connection and Isolation Panel    Frequency of Communication with Friends and  Family: More than  three times a week    Frequency of Social Gatherings with Friends and Family: Once a week    Attends Religious Services: More than 4 times per year    Active Member of Clubs or Organizations: Yes    Attends Banker Meetings: More than 4 times per year    Marital Status: Married  Catering Manager Violence: Not At Risk (08/06/2024)   Humiliation, Afraid, Rape, and Kick questionnaire    Fear of Current or Ex-Partner: No    Emotionally Abused: No    Physically Abused: No    Sexually Abused: No     Allergies  Allergen Reactions   Doxycycline  Nausea Only     CBC    Component Value Date/Time   WBC 5.4 09/11/2024 0942   RBC 4.98 09/11/2024 0942   HGB 13.8 09/11/2024 0942   HGB 12.7 08/02/2013 1837   HCT 42.8 09/11/2024 0942   HCT 38.0 08/02/2013 1837   PLT 173.0 09/11/2024 0942   PLT 186 08/02/2013 1837   MCV 86.0 09/11/2024 0942   MCV 83 08/02/2013 1837   MCH 27.7 04/17/2021 1546   MCHC 32.3 09/11/2024 0942   RDW 14.8 09/11/2024 0942   RDW 15.1 (H) 08/02/2013 1837   LYMPHSABS 1.4 09/11/2024 0942   MONOABS 0.4 09/11/2024 0942   EOSABS 0.1 09/11/2024 0942   BASOSABS 0.1 09/11/2024 0942    Pulmonary Functions Testing Results:     No data to display          Outpatient Medications Prior to Visit  Medication Sig Dispense Refill   ACETYLCARNITINE PO Take 1,500 mg by mouth.     Barberry-Oreg Grape-Goldenseal (BERBERINE COMPLEX PO) Take 500 mcg by mouth daily.     cetirizine (ZYRTEC) 10 MG tablet Take 10 mg by mouth daily as needed for allergies.      Cholecalciferol  (VITAMIN D ) 50 MCG (2000 UT) tablet Take 6,000 Units by mouth daily.     CRANBERRY EXTRACT PO Take 4,200 mg by mouth daily.      meclizine  (ANTIVERT ) 12.5 MG tablet Take 1 tablet (12.5 mg total) by mouth 3 (three) times daily as needed for dizziness. 30 tablet 0   Menaquinone-7 (VITAMIN K2 PO) Take 600 mcg by mouth daily.     mometasone  (ELOCON ) 0.1 % cream Apply 1 application topically daily.  Appear pea sized ( or less) amount to external ear, and slightly in canal. 15 g 1   Multiple Minerals-Vitamins (CITRACAL MAXIMUM PLUS) TABS Take 2 tablets by mouth daily.      Multiple Vitamins-Minerals (IMMUNE SUPPORT PO) Take 2 tablets by mouth daily.     mupirocin  ointment (BACTROBAN ) 2 % Apply 1 Application topically daily. 22 g 0   OVER THE COUNTER MEDICATION Take 1 tablet by mouth daily. Nutratherm otc supplement     Quercetin 250 MG TABS Take 250 mg by mouth daily.      TURMERIC-GINGER PO Take by mouth.     UNABLE TO FIND cbd 20 mg     Zinc 50 MG CAPS Take 50 mg by mouth daily.      No facility-administered medications prior to visit.

## 2024-11-07 NOTE — Patient Instructions (Signed)
  VISIT SUMMARY: You came in today for a follow-up evaluation of a small nodule in your left lung. This nodule was found during a CT scan, and you have no symptoms related to it. We discussed your medical history, including your elevated cholesterol and past experiences with respiratory illnesses. We also reviewed your family history and social background.  YOUR PLAN: -LEFT LOWER LOBE SUBCENTIMETER GROUND GLASS PULMONARY NODULE: A subcentimeter ground glass nodule in the left lower lobe of your lung is likely benign, but your age increases the risk of lung cancer. We will monitor this nodule closely. You will need a follow-up CT scan of your lung in 6 months to check if the nodule remains stable. If it does, we will repeat the CT scan in 1 year. If there are any changes in the nodule, we may need to consider a biopsy or surgical intervention.  INSTRUCTIONS: Please schedule a CT scan of your lung in 6 months. If the nodule remains stable, plan for another CT scan in 1 year. If there are any changes, we will discuss further steps, which may include a biopsy or surgery.         Contains text generated by [employer].

## 2024-11-09 ENCOUNTER — Ambulatory Visit (INDEPENDENT_AMBULATORY_CARE_PROVIDER_SITE_OTHER)

## 2024-11-09 DIAGNOSIS — E538 Deficiency of other specified B group vitamins: Secondary | ICD-10-CM | POA: Diagnosis not present

## 2024-11-09 MED ORDER — CYANOCOBALAMIN 1000 MCG/ML IJ SOLN
1000.0000 ug | Freq: Once | INTRAMUSCULAR | Status: AC
  Administered 2024-11-09: 1000 ug via INTRAMUSCULAR

## 2024-11-09 NOTE — Progress Notes (Signed)
 After obtaining consent, and per orders of [name], FNP injection of B-12 given IM in R Deltoid by  Doyce Croak, CMA. Patient tolerated injection well.

## 2024-11-27 ENCOUNTER — Telehealth: Payer: Self-pay | Admitting: Family

## 2024-11-27 ENCOUNTER — Telehealth: Admitting: Family Medicine

## 2024-11-27 DIAGNOSIS — B9689 Other specified bacterial agents as the cause of diseases classified elsewhere: Secondary | ICD-10-CM

## 2024-11-27 DIAGNOSIS — J019 Acute sinusitis, unspecified: Secondary | ICD-10-CM | POA: Diagnosis not present

## 2024-11-27 MED ORDER — IPRATROPIUM BROMIDE 0.03 % NA SOLN: 2.0000 | Freq: Two times a day (BID) | NASAL | 0 refills | Status: AC

## 2024-11-27 MED ORDER — AMOXICILLIN-POT CLAVULANATE 875-125 MG PO TABS: 1.0000 | ORAL_TABLET | Freq: Two times a day (BID) | ORAL | 0 refills | Status: AC

## 2024-11-27 NOTE — Telephone Encounter (Signed)
 Discussed with pt via mychart 09/27/24

## 2024-11-27 NOTE — Progress Notes (Signed)
 E-Visit for Sinus Problems  We are sorry that you are not feeling well.  Here is how we plan to help!  Based on what you have shared with me it looks like you have sinusitis.  Sinusitis is inflammation and infection in the sinus cavities of the head.  Based on your presentation I believe you most likely have Acute Bacterial Sinusitis.  This is an infection caused by bacteria and is treated with antibiotics. I have prescribed Augmentin 875mg /125mg  one tablet twice daily with food, for 7 days. and I have also prescribed Ipratropium Bromide Nasal Spray Use 1 spray in each nostril twice daily as needed for drainage; discontinue if too drying You may use an oral decongestant such as Mucinex  D or if you have glaucoma or high blood pressure use plain Mucinex . Saline nasal spray help and can safely be used as often as needed for congestion.  If you develop worsening sinus pain, fever or notice severe headache and vision changes, or if symptoms are not better after completion of antibiotic, please schedule an appointment with a health care provider.    Sinus infections are not as easily transmitted as other respiratory infection, however we still recommend that you avoid close contact with loved ones, especially the very young and elderly.  Remember to wash your hands thoroughly throughout the day as this is the number one way to prevent the spread of infection!  Home Care: Only take medications as instructed by your medical team. Complete the entire course of an antibiotic. Do not take these medications with alcohol. A steam or ultrasonic humidifier can help congestion.  You can place a towel over your head and breathe in the steam from hot water coming from a faucet. Avoid close contacts especially the very young and the elderly. Cover your mouth when you cough or sneeze. Always remember to wash your hands.  Get Help Right Away If: You develop worsening fever or sinus pain. You develop a severe head  ache or visual changes. Your symptoms persist after you have completed your treatment plan.  Make sure you Understand these instructions. Will watch your condition. Will get help right away if you are not doing well or get worse.  Your e-visit answers were reviewed by a board certified advanced clinical practitioner to complete your personal care plan.  Depending on the condition, your plan could have included both over the counter or prescription medications.  If there is a problem please reply  once you have received a response from your provider.  Your safety is important to us .  If you have drug allergies check your prescription carefully.    You can use MyChart to ask questions about today's visit, request a non-urgent call back, or ask for a work or school excuse for 24 hours related to this e-Visit. If it has been greater than 24 hours you will need to follow up with your provider, or enter a new e-Visit to address those concerns.  You will get an e-mail in the next two days asking about your experience.  I hope that your e-visit has been valuable and will speed your recovery. Thank you for using e-visits.  I have spent 5 minutes in review of e-visit questionnaire, review and updating patient chart, medical decision making and response to patient.   Chiquita CHRISTELLA Barefoot, NP

## 2024-11-27 NOTE — Telephone Encounter (Signed)
-----   Message from Redell JAYSON Burnet sent at 10/08/2024  2:36 PM EDT ----- Regarding: RE: Guidance- trace hemoglobin in urine No concern.  Dipstick positive urine has very little clinical significance, does not require urology referral.  We only would recommend referral or further evaluation for patients with more than 3 RBC on microscopic analysis  Redell Burnet, MD 10/08/2024 ----- Message ----- From: Dineen Rollene MATSU, FNP Sent: 10/08/2024   1:43 PM EDT To: Redell JAYSON Burnet, MD Subject: Guidance- trace hemoglobin in urine            Dr Burnet,  Sorry to bother you. I just wanted your advice regarding trace hemoglobin seen in patient's urine. No RBCs. No anemia.   I obtained the UA in setting of urinary incontinence , urgency.  This would be significant if other cytopenia present to indicate paroxysmal nocturnal hemoglobinuria or if concern for hemolysis?   Could moderate exercise be the culprit and this be myohemoglobin?   Appreciate your advice.   Rollene

## 2024-12-10 ENCOUNTER — Ambulatory Visit

## 2024-12-10 DIAGNOSIS — E538 Deficiency of other specified B group vitamins: Secondary | ICD-10-CM

## 2024-12-10 MED ORDER — CYANOCOBALAMIN 1000 MCG/ML IJ SOLN
1000.0000 ug | Freq: Once | INTRAMUSCULAR | Status: AC
  Administered 2024-12-10: 1000 ug via INTRAMUSCULAR

## 2024-12-10 NOTE — Progress Notes (Signed)
 Pt presented for their vitamin B12 injection. Pt was identified through two identifiers. Pt tolerated shot well in their left deltoid.

## 2025-01-16 ENCOUNTER — Ambulatory Visit

## 2025-08-07 ENCOUNTER — Ambulatory Visit

## 2025-10-02 ENCOUNTER — Ambulatory Visit
